# Patient Record
Sex: Female | Born: 1970
Health system: Southern US, Community
[De-identification: ages and names within clinical notes are randomized; demographics above are authoritative.]

## PROBLEM LIST (undated history)

## (undated) DIAGNOSIS — K219 Gastro-esophageal reflux disease without esophagitis: Secondary | ICD-10-CM

## (undated) DIAGNOSIS — N926 Irregular menstruation, unspecified: Secondary | ICD-10-CM

## (undated) DIAGNOSIS — G43909 Migraine, unspecified, not intractable, without status migrainosus: Secondary | ICD-10-CM

## (undated) DIAGNOSIS — Z9889 Other specified postprocedural states: Secondary | ICD-10-CM

## (undated) DIAGNOSIS — R112 Nausea with vomiting, unspecified: Secondary | ICD-10-CM

## (undated) DIAGNOSIS — F4481 Dissociative identity disorder: Secondary | ICD-10-CM

## (undated) DIAGNOSIS — F419 Anxiety disorder, unspecified: Secondary | ICD-10-CM

## (undated) DIAGNOSIS — Z87442 Personal history of urinary calculi: Secondary | ICD-10-CM

## (undated) DIAGNOSIS — M1990 Unspecified osteoarthritis, unspecified site: Secondary | ICD-10-CM

## (undated) DIAGNOSIS — N939 Abnormal uterine and vaginal bleeding, unspecified: Secondary | ICD-10-CM

## (undated) DIAGNOSIS — N6001 Solitary cyst of right breast: Secondary | ICD-10-CM

## (undated) DIAGNOSIS — T8859XA Other complications of anesthesia, initial encounter: Secondary | ICD-10-CM

## (undated) DIAGNOSIS — F329 Major depressive disorder, single episode, unspecified: Secondary | ICD-10-CM

## (undated) DIAGNOSIS — F32A Depression, unspecified: Secondary | ICD-10-CM

## (undated) HISTORY — DX: Major depressive disorder, single episode, unspecified: F32.9

## (undated) HISTORY — DX: Anxiety disorder, unspecified: F41.9

## (undated) HISTORY — PX: BREAST BIOPSY: SHX20

## (undated) HISTORY — DX: Migraine, unspecified, not intractable, without status migrainosus: G43.909

## (undated) HISTORY — DX: Depression, unspecified: F32.A

## (undated) HISTORY — PX: ABDOMINAL HYSTERECTOMY: SHX81

## (undated) HISTORY — DX: Dissociative identity disorder: F44.81

## (undated) HISTORY — DX: Unspecified osteoarthritis, unspecified site: M19.90

## (undated) HISTORY — DX: Irregular menstruation, unspecified: N92.6

## (undated) HISTORY — PX: TUBAL LIGATION: SHX77

## (undated) HISTORY — DX: Abnormal uterine and vaginal bleeding, unspecified: N93.9

---

## 2011-12-01 ENCOUNTER — Emergency Department (HOSPITAL_COMMUNITY)
Admission: EM | Admit: 2011-12-01 | Discharge: 2011-12-01 | Disposition: A | Discharge status: Home / Self Care | Payer: BC Managed Care – PPO | Attending: Emergency Medicine | Admitting: Emergency Medicine

## 2011-12-01 ENCOUNTER — Emergency Department (HOSPITAL_COMMUNITY): Admission: EM | Admit: 2011-12-01 | Payer: Medicare Other | Source: Home / Self Care

## 2011-12-01 ENCOUNTER — Encounter (HOSPITAL_COMMUNITY): Payer: Self-pay | Admitting: Emergency Medicine

## 2011-12-01 DIAGNOSIS — G43909 Migraine, unspecified, not intractable, without status migrainosus: Secondary | ICD-10-CM | POA: Insufficient documentation

## 2011-12-01 DIAGNOSIS — R51 Headache: Secondary | ICD-10-CM

## 2011-12-01 DIAGNOSIS — F17200 Nicotine dependence, unspecified, uncomplicated: Secondary | ICD-10-CM | POA: Insufficient documentation

## 2011-12-01 HISTORY — DX: Migraine, unspecified, not intractable, without status migrainosus: G43.909

## 2011-12-01 MED ORDER — SUMATRIPTAN SUCCINATE 6 MG/0.5ML ~~LOC~~ SOLN: SUBCUTANEOUS | Status: DC

## 2011-12-01 MED ORDER — SUMATRIPTAN SUCCINATE 6 MG/0.5ML ~~LOC~~ SOLN
6.0000 mg | Freq: Once | SUBCUTANEOUS | Status: AC
  Administered 2011-12-01: 6 mg via SUBCUTANEOUS
  Filled 2011-12-01: qty 0.5

## 2011-12-01 NOTE — ED Notes (Signed)
History of migraines for past 9 months. Pt states had MRI at [facility] but doenst know results. Pt states migraines are coming frequently like 2 or more a week.

## 2011-12-01 NOTE — ED Notes (Signed)
Pt reports intermittent headaches x9 months. Pt reports current headache began yesterday at 9AM. Pt denies taking anything OTC prior to arrival. Pt reports n/v. Emesis x3. Pt denies SOB,CP. NAD noted.

## 2011-12-01 NOTE — ED Provider Notes (Signed)
History     CSN: [id number]  Arrival date & time 12/01/11  1138   First MD Initiated Contact with Patient 12/01/11 1313      Chief Complaint  Patient presents with  . Migraine    (Consider location/radiation/quality/duration/timing/severity/associated sxs/prior treatment) The history is provided by the patient.  pt c/o frontal headaches, esp on right side, on and off for 9 months. Gradual onset, slowly worse. This headache is similar to prior, not her worst headache. Dull/ throbbing constant, non radiating for past day. No recent head trauma, injury, fall or syncope. No eye pain or change in vision. No neck pain or stiffness. No sinus pain/drainage or fevers. Nausea. No vomiting or diarrhea. No abd pain. No gu c/o. No numbness/weakness. No problems w balance or coordination. Drove self to ed.      History reviewed. No pertinent past medical history.  Past Surgical History  Procedure Date  . Cesarean section   . Tubal ligation     History reviewed. No pertinent family history.  History  Substance Use Topics  . Smoking status: Current Every Day Smoker -- 0.5 packs/day    Types: Cigarettes  . Smokeless tobacco: Not on file  . Alcohol Use: No    OB History    Grav Para Term Preterm Abortions TAB SAB Ect Mult Living                  Review of Systems  Constitutional: Negative for fever and chills.  HENT: Negative for neck pain and neck stiffness.   Eyes: Negative for pain, redness and visual disturbance.  Respiratory: Negative for cough and shortness of breath.   Cardiovascular: Negative for chest pain and leg swelling.  Gastrointestinal: Negative for abdominal pain.  Genitourinary: Negative for flank pain.  Musculoskeletal: Negative for back pain.  Skin: Negative for rash.  Neurological: Positive for headaches. Negative for syncope, weakness, light-headedness and numbness.  Hematological: Does not bruise/bleed easily.  Psychiatric/Behavioral: Negative for  confusion.    Allergies  Review of patient's allergies indicates no known allergies.  Home Medications  No current outpatient prescriptions on file.  BP 120/77  Pulse 71  Temp 97.5 F (36.4 C) (Oral)  Resp 18  Ht 5\' 6"  (1.676 m)  Wt 175 lb (79.379 kg)  BMI 28.25 kg/m2  SpO2 99%  LMP 12/01/2011  Physical Exam  Nursing note and vitals reviewed. Constitutional: She is oriented to person, place, and time. She appears well-developed and well-nourished. No distress.  HENT:  Head: Atraumatic.  Nose: Nose normal.  Mouth/Throat: Oropharynx is clear and moist.       No sinus or temporal tenderness.  Eyes: Conjunctivae normal and EOM are normal. Pupils are equal, round, and reactive to light. No scleral icterus.  Neck: Neck supple. No tracheal deviation present. No thyromegaly present.       No stiffness or rigidity.   Cardiovascular: Normal rate, regular rhythm, normal heart sounds and intact distal pulses.  Exam reveals no gallop and no friction rub.   No murmur heard. Pulmonary/Chest: Effort normal and breath sounds normal. No respiratory distress.  Abdominal: Soft. Normal appearance and bowel sounds are normal. She exhibits no distension. There is no tenderness.  Genitourinary:       No cva tenderness.  Musculoskeletal: Normal range of motion. She exhibits no edema and no tenderness.  Neurological: She is alert and oriented to person, place, and time. No cranial nerve deficit.       Motor intact bilaterally. Steady gait.  Skin: Skin is warm and dry. No rash noted. She is not diaphoretic.  Psychiatric: She has a normal mood and affect.    ED Course  Procedures (including critical care time)    MDM  Pt drove self. No meds pta. imitrex sq.   Pt states had ct for same at [facility], denies any acute process noted.   Staff indicates unable to get records faxed from [facility].  On recheck, pt notes resolution headache. Pt appears stable for d/c.        Suzi Roots,  MD 12/01/11 [phone]

## 2011-12-01 NOTE — ED Notes (Signed)
Pt reports that she has had headaches off and on x9 months. Pt reports current headache started at 9am. Pt hasn't taken anything OTC prior to arrival. Pt reports n/v with emesis x3. Pt denies SOB,CP. NAD noted.

## 2013-11-19 ENCOUNTER — Encounter: Payer: Self-pay | Admitting: Obstetrics and Gynecology

## 2013-11-19 ENCOUNTER — Ambulatory Visit (INDEPENDENT_AMBULATORY_CARE_PROVIDER_SITE_OTHER): Payer: BC Managed Care – PPO | Admitting: Obstetrics and Gynecology

## 2013-11-19 VITALS — BP 130/80 | Ht 67.0 in | Wt 239.0 lb

## 2013-11-19 DIAGNOSIS — N921 Excessive and frequent menstruation with irregular cycle: Secondary | ICD-10-CM

## 2013-11-19 DIAGNOSIS — N920 Excessive and frequent menstruation with regular cycle: Secondary | ICD-10-CM

## 2013-11-19 HISTORY — DX: Excessive and frequent menstruation with irregular cycle: N92.1

## 2013-11-19 MED ORDER — MEDROXYPROGESTERONE ACETATE 10 MG PO TABS: 10.0000 mg | ORAL_TABLET | Freq: Every day | ORAL | Status: DC

## 2013-11-19 NOTE — Progress Notes (Signed)
[facility] Visit  Patient name: Christine [name] MRN [id number]  Date of birth: 1970-04-14  CC & HPI:  Christine [name] is a 43 y.o. female presenting today for irregular bleeding. She states that she has never had a pelvic US. She states that she no longer wants her periods. She states that she has multiple periods in a month, and they are very heavy. She states that she has had irregular heavy periods for a couple months. She states that she was seen in the ED for the bleeding and anemia, was given Provera and sent home. Pt states that with this last period she had golf ball sized clots, and had to change her pad four times in 2 hours. Pt states that the bleeding has stopped.   She states that she is having associated symptoms of abdominal pain, lower back pain, and thigh pain. She states that from [facility] she was bleeding too bad and she was informed that she had a UTI but was not given a medication for it. Pt states that she gets migraines before starting a period.  Pt denies any pain or discomfort at this time. She states that she has had her tubes tied. She states that she smokes 1 PPD. She states that it has been awhile since she has had a pap smear. She states that her last period was 11/05/13.   ROS:  +Irregular bleeding No other complaints  Pertinent History Reviewed:   Reviewed: Significant for  Medical         Past Medical History  Diagnosis Date  . Migraines   . Depression   . Anxiety   . Irregular uterine bleeding                               Surgical Hx:    Past Surgical History  Procedure Laterality Date  . Tubal ligation    . Cesarean section  1994 , 1997    x 2    Medications: Reviewed & Updated - see associated section                      Current outpatient prescriptions:escitalopram (LEXAPRO) 10 MG tablet, , Disp: , Rfl: ;  medroxyPROGESTERone (PROVERA) 5 MG tablet, , Disp: , Rfl: ;  SUMAtriptan (IMITREX) 50 MG tablet, , Disp: ,  Rfl:    Social History: Reviewed -  reports that she has been smoking Cigarettes.  She has a 30 pack-year smoking history. She has never used smokeless tobacco.  Objective Findings:  Vitals: Blood pressure 130/80, height 5\' 7"  (1.702 m), weight 239 lb (108.41 kg), last menstrual period 11/03/2013.  Physical Examination:  Pelvic - normal external genitalia, vulva, vagina, cervix, uterus and adnexa,  VULVA: normal appearing vulva with no masses, tenderness or lesions,  VAGINA: normal appearing vagina with normal color and discharge, no lesions, CERVIX: normal appearing cervix without discharge or lesions,  UTERUS: uterus is normal size, shape, consistency and nontender, good support.      Assessment & Plan:   A:  1. Irregular bleeding /menorhagia  P:  1. Rx for 14 days of Provera for recurrent irregular bleeding.  2. Pelvic US immediately after the next period. With followup visit 3 will need pap      This chart was scribed for [name], MD by Steva Colder, ED Scribe. The patient was seen in room 1 at  11:15 AM.

## 2013-11-19 NOTE — Patient Instructions (Signed)
Endometrial Ablation Endometrial ablation removes the lining of the uterus (endometrium). It is usually a same-day, outpatient treatment. Ablation helps avoid major surgery, such as surgery to remove the cervix and uterus (hysterectomy). After endometrial ablation, you will have little or no menstrual bleeding and may not be able to have children. However, if you are premenopausal, you will need to use a reliable method of birth control following the procedure because of the small chance that pregnancy can occur. There are different reasons to have this procedure, which include:  Heavy periods.  Bleeding that is causing anemia.  Irregular bleeding.  Bleeding fibroids on the lining inside the uterus if they are smaller than 3 centimeters. This procedure should not be done if:  You want children in the future.  You have severe cramps with your menstrual period.  You have precancerous or cancerous cells in your uterus.  You were recently pregnant.  You have gone through menopause.  You have had major surgery on the uterus, such as a cesarean delivery. LET YOUR HEALTH CARE PROVIDER KNOW ABOUT:  Any allergies you have.  All medicines you are taking, including vitamins, herbs, eye drops, creams, and over-the-counter medicines.  Previous problems you or members of your family have had with the use of anesthetics.  Any blood disorders you have.  Previous surgeries you have had.  Medical conditions you have. RISKS AND COMPLICATIONS  Generally, this is a safe procedure. However, as with any procedure, complications can occur. Possible complications include:  Perforation of the uterus.  Bleeding.  Infection of the uterus, bladder, or vagina.  Injury to surrounding organs.  An air bubble to the lung (air embolus).  Pregnancy following the procedure.  Failure of the procedure to help the problem, requiring hysterectomy.  Decreased ability to diagnose cancer in the lining of  the uterus. BEFORE THE PROCEDURE  The lining of the uterus must be tested to make sure there is no pre-cancerous or cancer cells present.  An ultrasound may be performed to look at the size of the uterus and to check for abnormalities.  Medicines may be given to thin the lining of the uterus. PROCEDURE  During the procedure, your health care provider will use a tool called a resectoscope to help see inside your uterus. There are different ways to remove the lining of your uterus.   Radiofrequency - This method uses a radiofrequency-alternating electric current to remove the lining of the uterus.  Cryotherapy - This method uses extreme cold to freeze the lining of the uterus.  Heated-Free Liquid - This method uses heated salt (saline) solution to remove the lining of the uterus.  Microwave - This method uses high-energy microwaves to heat up the lining of the uterus to remove it.  Thermal balloon - This method involves inserting a catheter with a balloon tip into the uterus. The balloon tip is filled with heated fluid to remove the lining of the uterus. AFTER THE PROCEDURE  After your procedure, do not have sexual intercourse or insert anything into your vagina until permitted by your health care provider. After the procedure, you may experience:  Cramps.  Vaginal discharge.  Frequent urination. Document Released: 12/24/2003 Document Revised: 10/16/2012 Document Reviewed: 07/17/2012 [employer] Patient Information 2015 [employer]. This information is not intended to replace advice given to you by your health care provider. Make sure you discuss any questions you have with your health care provider.  

## 2013-11-19 NOTE — Progress Notes (Signed)
Patient ID: [name], female   DOB: Jul 27, 1970, 43 y.o.   MRN: [id number] Pt here today for irregular bleeding. Pt states that she has multiple periods in a month, and they are very heavy. Pt was seen in the ED for the bleeding and anemia, was given medication (Provera) and sent home. Pt states that with this last period she had golf ball sized clots, and had to change her pad four times in 2 hours. Pt states that the bleeding has stopped. Pt states that she has pain in her thighs, abdomen and lower back. Pt states that she gets migraines before starting a period. Pt states that her periods have been irregular for the last couple months, she states that her periods have always been heavy but this last period was the worse. Pt denies any pain or discomfort at this time.

## 2013-12-09 ENCOUNTER — Other Ambulatory Visit: Payer: Self-pay | Admitting: Obstetrics and Gynecology

## 2013-12-09 DIAGNOSIS — N921 Excessive and frequent menstruation with irregular cycle: Secondary | ICD-10-CM

## 2013-12-10 ENCOUNTER — Ambulatory Visit (INDEPENDENT_AMBULATORY_CARE_PROVIDER_SITE_OTHER): Payer: BC Managed Care – PPO

## 2013-12-10 ENCOUNTER — Ambulatory Visit (INDEPENDENT_AMBULATORY_CARE_PROVIDER_SITE_OTHER): Payer: BC Managed Care – PPO | Admitting: Obstetrics and Gynecology

## 2013-12-10 ENCOUNTER — Encounter: Payer: Self-pay | Admitting: Obstetrics and Gynecology

## 2013-12-10 VITALS — BP 120/80 | Ht 66.0 in

## 2013-12-10 DIAGNOSIS — N921 Excessive and frequent menstruation with irregular cycle: Secondary | ICD-10-CM

## 2013-12-10 DIAGNOSIS — N920 Excessive and frequent menstruation with regular cycle: Secondary | ICD-10-CM | POA: Insufficient documentation

## 2013-12-10 MED ORDER — MEDROXYPROGESTERONE ACETATE 5 MG PO TABS: 5.0000 mg | ORAL_TABLET | Freq: Every day | ORAL | Status: DC

## 2013-12-10 NOTE — Progress Notes (Signed)
Patient ID: Christine [name], female   DOB: [birth year], 43 y.o.   MRN: [id number] Patient ID: Christine [name], female   DOB: 01-28-1971, 43 y.o.   MRN: [id number]   [facility] Visit  Patient name: Christine [name] MRN [id number]  Date of birth: [birth year]  CC & HPI:  Christine [name] is a 43 y.o. female presenting today for [place] results. She states that she had her [place] because of heavy bleeding. She states that she can function with the pain that she gets normally. She states that she bleeds heavy for 4-5 days during her period. She states that she had a hx of migraines and she voices concerns for if there is a correlation. She states that she smokes. She denies any other associated symptoms. ROS:  +Menorrhagia No other complaints.  Pertinent History Reviewed:   Reviewed: Significant for  Medical         Past Medical History  Diagnosis Date  . Migraines   . Depression   . Anxiety   . Irregular uterine bleeding                               Surgical Hx:    Past Surgical History  Procedure Laterality Date  . Tubal ligation    . Cesarean section  1994 , 1997    x 2    Medications: Reviewed & Updated - see associated section                      Current outpatient prescriptions:escitalopram (LEXAPRO) 10 MG tablet, , Disp: , Rfl: ;  SUMAtriptan (IMITREX) 50 MG tablet, , Disp: , Rfl: ;  medroxyPROGESTERone (PROVERA) 10 MG tablet, Take 1 tablet (10 mg total) by mouth daily., Disp: 14 tablet, Rfl: 3   Social History: Reviewed -  reports that she has been smoking Cigarettes.  She has a 30 pack-year smoking history. She has never used smokeless tobacco.  Objective Findings:  Vitals: Blood pressure 120/80, height 5\' 6"  (1.676 m), last menstrual period 12/08/2013.  Physical Examination: not indicated see [place] results below  [place] results 12/10/13 Christine [name] is a 43 y.o. LMP 12/08/2013 for a pelvic sonogram for menorrhagia.  Uterus 9.4 x 6.9 x 5.2 cm, anteverted no obvious myometrial  masses noted although "streaky" posterior shadowing noted (?adenomyosis)  Endometrium 5.1 mm, symmetrical,  Right ovary 2.7 x 2.2 x 2.0 cm,  Left ovary 2.9 x 2.8 x 2.3 cm,  No free fluid or adnexal masses noted within the pelvis  Technician Comments:  Anteverted uterus noted with posterior "streaky" shadowing, Endometrium=5.44mm, bilateral adnexa/ovaries appear WNL, no free fluid or adnexal masses noted    Assessment & Plan:   A:  1. Menorrhagia with irregular cycle   P:  1. Counseling about endometrial ablation vs IUD with pt 2. F/U for Endometrium biopsy/Pre-op visit in office in 2 weeks. 3 begin provera 5 mg daily supression of endometrium til surgery 4 preop at time of endometrial bx This chart was scribed for [name], MD by Steva Colder, ED Scribe. The patient was seen in office at 10:43 AM.

## 2013-12-10 NOTE — Progress Notes (Signed)
Patient ID: Christine [name], female   DOB: 1970/07/10, 43 y.o.   MRN: [id number] Pt here today for follow up visit and [place]. Pt denies any problems or concerns at this time. Pt states that symptoms are the same as before no better.

## 2013-12-10 NOTE — Progress Notes (Deleted)
Patient ID: [name], female   DOB: Apr 20, 1970, 43 y.o.   MRN: [id number]   [facility] Visit  Patient name: Christine [name] MRN [id number]  Date of birth: 08/19/1970  CC & HPI:  Christine [name] is a 43 y.o. female presenting today for [place] results. She states that she had her [place] because of heavy bleeding. She states that she can function with the pain that she gets normally. She states that she bleeds heavy for 4-5 days during her period. She states that she had a hx of migraines and she voices concerns for if there is a correlation. She states that she smokes. She denies any other associated symptoms. ROS:  +Menorrhagia No other complaints.  Pertinent History Reviewed:   Reviewed: Significant for  Medical         Past Medical History  Diagnosis Date   Migraines    Depression    Anxiety    Irregular uterine bleeding                               Surgical Hx:    Past Surgical History  Procedure Laterality Date   Tubal ligation     Cesarean section  1994 , 1997    x 2    Medications: Reviewed & Updated - see associated section                      Current outpatient prescriptions:escitalopram (LEXAPRO) 10 MG tablet, , Disp: , Rfl: ;  SUMAtriptan (IMITREX) 50 MG tablet, , Disp: , Rfl: ;  medroxyPROGESTERone (PROVERA) 10 MG tablet, Take 1 tablet (10 mg total) by mouth daily., Disp: 14 tablet, Rfl: 3   Social History: Reviewed -  reports that she has been smoking Cigarettes.  She has a 30 pack-year smoking history. She has never used smokeless tobacco.  Objective Findings:  Vitals: Blood pressure 120/80, height 5\' 6"  (1.676 m), last menstrual period 12/08/2013.  Physical Examination: not indicated see [place] results below  [place] results 12/10/13 Christine [name] is a 43 y.o. LMP 12/08/2013 for a pelvic sonogram for menorrhagia.  Uterus 9.4 x 6.9 x 5.2 cm, anteverted no obvious myometrial masses noted although "streaky" posterior shadowing noted (?adenomyosis)   Endometrium 5.1 mm, symmetrical,  Right ovary 2.7 x 2.2 x 2.0 cm,  Left ovary 2.9 x 2.8 x 2.3 cm,  No free fluid or adnexal masses noted within the pelvis  Technician Comments:  Anteverted uterus noted with posterior "streaky" shadowing, Endometrium=5.38mm, bilateral adnexa/ovaries appear WNL, no free fluid or adnexal masses noted    Assessment & Plan:   A:  1. Menorrhagia with irregular cycle   P:  1. Counseling about endometrial ablation vs IUD with pt 2. F/U for Endometrium biopsy/Pre-op visit in office in 2 weeks. 3 begin provera 5 mg daily supression of endometrium til surgery 4 preop at time of endometrial bx This chart was scribed for [name], MD by Steva Colder, ED Scribe. The patient was seen in office at 10:43 AM.

## 2013-12-24 ENCOUNTER — Other Ambulatory Visit: Payer: Self-pay | Admitting: Obstetrics and Gynecology

## 2013-12-24 ENCOUNTER — Ambulatory Visit (INDEPENDENT_AMBULATORY_CARE_PROVIDER_SITE_OTHER): Payer: BC Managed Care – PPO | Admitting: Obstetrics and Gynecology

## 2013-12-24 ENCOUNTER — Encounter: Payer: Self-pay | Admitting: Obstetrics and Gynecology

## 2013-12-24 VITALS — BP 110/70 | Ht 66.0 in | Wt 242.0 lb

## 2013-12-24 DIAGNOSIS — N921 Excessive and frequent menstruation with irregular cycle: Secondary | ICD-10-CM

## 2013-12-24 DIAGNOSIS — Z3200 Encounter for pregnancy test, result unknown: Secondary | ICD-10-CM

## 2013-12-24 DIAGNOSIS — Z3202 Encounter for pregnancy test, result negative: Secondary | ICD-10-CM

## 2013-12-24 LAB — POCT URINE PREGNANCY: Preg Test, Ur: NEGATIVE

## 2013-12-24 NOTE — Progress Notes (Signed)
Christine [name] is a 43 y.o. female who presents today for endometrial biopsy. Discussed IUD option with pt and she declined them. Methods of reducing heavy periods discussed with patient as well. She denies any other symptoms. She states that her last period was Patient's last menstrual period was 12/08/2013. She states that her last period was not normal because of the 5 mg Provera that she is on. She states that she is sexually active and her tubes are tied. She denies being pregnant.   Endometrial Biopsy: Patient given informed consent, signed copy in the chart, time out was performed. Time out taken. The patient was placed in the lithotomy position and the cervix brought into view with sterile speculum.  Portio of cervix cleansed x 2 with betadine swabs.  A tenaculum was placed in the anterior lip of the cervix. The uterus was sounded for depth of 10 cm,. Milex uterine Explora 3 mm was introduced to into the uterus, suction created,  and an endometrial sample was obtained. All equipment was removed and accounted for.   The patient tolerated the procedure well.   Patient given post procedure instructions.  Followup: will review pathology and present to insurer for consideration of endometrial ablation A:  1. menorrhagia with irregular cycles, declining consideration of IUD 2  P: 1. F/U for post op visit Will hold surgery till reviewed with insurer. This chart was scribed for [name], MD by Steva Colder, ED Scribe. The patient was seen in room 2 at 3:33 PM.

## 2013-12-24 NOTE — Patient Instructions (Signed)
We will call you with an update once peer to peer review completed with biopsy results  Endometrial Ablation Endometrial ablation removes the lining of the uterus (endometrium). It is usually a same-day, outpatient treatment. Ablation helps avoid major surgery, such as surgery to remove the cervix and uterus (hysterectomy). After endometrial ablation, you will have little or no menstrual bleeding and may not be able to have children. However, if you are premenopausal, you will need to use a reliable method of birth control following the procedure because of the small chance that pregnancy can occur. There are different reasons to have this procedure, which include:  Heavy periods.  Bleeding that is causing anemia.  Irregular bleeding.  Bleeding fibroids on the lining inside the uterus if they are smaller than 3 centimeters. This procedure should not be done if:  You want children in the future.  You have severe cramps with your menstrual period.  You have precancerous or cancerous cells in your uterus.  You were recently pregnant.  You have gone through menopause.  You have had major surgery on the uterus, such as a cesarean delivery. LET [facility] CARE PROVIDER KNOW ABOUT:  Any allergies you have.  All medicines you are taking, including vitamins, herbs, eye drops, creams, and over-the-counter medicines.  Previous problems you or members of your family have had with the use of anesthetics.  Any blood disorders you have.  Previous surgeries you have had.  Medical conditions you have. RISKS AND COMPLICATIONS  Generally, this is a safe procedure. However, as with any procedure, complications can occur. Possible complications include:  Perforation of the uterus.  Bleeding.  Infection of the uterus, bladder, or vagina.  Injury to surrounding organs.  An air bubble to the lung (air embolus).  Pregnancy following the procedure.  Failure of the procedure to help the  problem, requiring hysterectomy.  Decreased ability to diagnose cancer in the lining of the uterus. BEFORE THE PROCEDURE  The lining of the uterus must be tested to make sure there is no pre-cancerous or cancer cells present.  An ultrasound may be performed to look at the size of the uterus and to check for abnormalities.  Medicines may be given to thin the lining of the uterus. PROCEDURE  During the procedure, your health care provider will use a tool called a resectoscope to help see inside your uterus. There are different ways to remove the lining of your uterus.   Radiofrequency - This method uses a radiofrequency-alternating electric current to remove the lining of the uterus.  Cryotherapy - This method uses extreme cold to freeze the lining of the uterus.  Heated-Free Liquid - This method uses heated salt (saline) solution to remove the lining of the uterus.  Microwave - This method uses high-energy microwaves to heat up the lining of the uterus to remove it.  Thermal balloon - This method involves inserting a catheter with a balloon tip into the uterus. The balloon tip is filled with heated fluid to remove the lining of the uterus. AFTER THE PROCEDURE  After your procedure, do not have sexual intercourse or insert anything into your vagina until permitted by your health care provider. After the procedure, you may experience:  Cramps.  Vaginal discharge.  Frequent urination. Document Released: 12/24/2003 Document Revised: 10/16/2012 Document Reviewed: 07/17/2012 [facility] Patient Information 2015 [place], [place]. This information is not intended to replace advice given to you by your health care provider. Make sure you discuss any questions you have with  your health care provider.

## 2013-12-24 NOTE — Progress Notes (Signed)
Patient ID: Christine [name], female   DOB: 12-Oct-1970, 43 y.o.   MRN: [id number] Pt here today for her pre op visit. Pt's insurance requires a peer to peer review before approving her surgery.

## 2013-12-30 ENCOUNTER — Ambulatory Visit (INDEPENDENT_AMBULATORY_CARE_PROVIDER_SITE_OTHER): Payer: BC Managed Care – PPO | Admitting: Obstetrics & Gynecology

## 2013-12-30 ENCOUNTER — Encounter: Payer: Self-pay | Admitting: Obstetrics & Gynecology

## 2013-12-30 VITALS — BP 120/70 | Wt 243.0 lb

## 2013-12-30 DIAGNOSIS — B9689 Other specified bacterial agents as the cause of diseases classified elsewhere: Secondary | ICD-10-CM

## 2013-12-30 DIAGNOSIS — N760 Acute vaginitis: Secondary | ICD-10-CM

## 2013-12-30 DIAGNOSIS — A499 Bacterial infection, unspecified: Secondary | ICD-10-CM

## 2013-12-30 DIAGNOSIS — R319 Hematuria, unspecified: Secondary | ICD-10-CM

## 2013-12-30 LAB — POCT URINALYSIS DIPSTICK
GLUCOSE UA: NEGATIVE
Ketones, UA: NEGATIVE
Leukocytes, UA: NEGATIVE
Nitrite, UA: NEGATIVE
Protein, UA: NEGATIVE
RBC UA: 2

## 2013-12-30 MED ORDER — CLINDAMYCIN PHOSPHATE 100 MG VA SUPP: 100.0000 mg | Freq: Every day | VAGINAL | Status: DC

## 2013-12-30 NOTE — Progress Notes (Signed)
Patient ID: Christine [name], female   DOB: 08-23-1970, 43 y.o.   MRN: [id number] Pt with history of recurrent BV, has symptoms today Urine is clear + odor  Blood pressure 120/70, weight 243 lb (110.224 kg), last menstrual period 12/08/2013. [facility] Some blood in the vault Wet prep lots of RBC some clue cells no trichomonas no yeast   Cleocin vaginal x 3(pt states metrogel does not absorb) Probiotics recommended and fiber for poor GI habits  biospy report given to pt as normal Continue with ablation plans

## 2013-12-31 LAB — URINE CULTURE
Colony Count: NO GROWTH
ORGANISM ID, BACTERIA: NO GROWTH

## 2014-01-07 NOTE — Patient Instructions (Signed)
Christine [name]  01/07/2014   Your procedure is scheduled on:   01/13/2014  Report to [facility] at  1045  AM.  Call this number if you have problems the morning of surgery: [phone]   Remember:   Do not eat food or drink liquids after midnight.   Take these medicines the morning of surgery with A SIP OF WATER: lexapro, imitrex if needed.   Do not wear jewelry, make-up or nail polish.  Do not wear lotions, powders, or perfumes.   Do not shave 48 hours prior to surgery. Men may shave face and neck.  Do not bring valuables to the hospital.  [facility] is not responsible for any belongings or valuables.               Contacts, dentures or bridgework may not be worn into surgery.  Leave suitcase in the car. After surgery it may be brought to your room.  For patients admitted to the hospital, discharge time is determined by your treatment team.               Patients discharged the day of surgery will not be allowed to drive home.  Name and phone number of your driver: family  Special Instructions: Shower using CHG 2 nights before surgery and the night before surgery.  If you shower the day of surgery use CHG.  Use special wash - you have one bottle of CHG for all showers.  You should use approximately 1/3 of the bottle for each shower.   Please read over the following fact sheets that you were given: Pain Booklet, Coughing and Deep Breathing, Surgical Site Infection Prevention, Anesthesia Post-op Instructions and Care and Recovery After Surgery Dilation and Curettage or Vacuum Curettage Dilation and curettage (D&C) and vacuum curettage are minor procedures. A D&C involves stretching (dilation) the cervix and scraping (curettage) the inside lining of the womb (uterus). During a D&C, tissue is gently scraped from the inside lining of the uterus. During a vacuum curettage, the lining and tissue in the uterus are removed with the use of gentle suction.  Curettage may be  performed to either diagnose or treat a problem. As a diagnostic procedure, curettage is performed to examine tissues from the uterus. A diagnostic curettage may be performed for the following symptoms:   Irregular bleeding in the uterus.   Bleeding with the development of clots.   Spotting between menstrual periods.   Prolonged menstrual periods.   Bleeding after menopause.   No menstrual period (amenorrhea).   A change in size and shape of the uterus.  As a treatment procedure, curettage may be performed for the following reasons:   Removal of an IUD (intrauterine device).   Removal of retained placenta after giving birth. Retained placenta can cause an infection or bleeding severe enough to require transfusions.   Abortion.   Miscarriage.   Removal of polyps inside the uterus.   Removal of uncommon types of noncancerous lumps (fibroids).  LET [facility] CARE PROVIDER KNOW ABOUT:   Any allergies you have.   All medicines you are taking, including vitamins, herbs, eye drops, creams, and over-the-counter medicines.   Previous problems you or members of your family have had with the use of anesthetics.   Any blood disorders you have.   Previous surgeries you have had.   Medical conditions you have. RISKS AND COMPLICATIONS  Generally, this is a safe procedure. However, as  with any procedure, complications can occur. Possible complications include:  Excessive bleeding.   Infection of the uterus.   Damage to the cervix.   Development of scar tissue (adhesions) inside the uterus, later causing abnormal amounts of menstrual bleeding.   Complications from the general anesthetic, if a general anesthetic is used.   Putting a hole (perforation) in the uterus. This is rare.  BEFORE THE PROCEDURE   Eat and drink before the procedure only as directed by your health care provider.   Arrange for someone to take you home.  PROCEDURE  This  procedure usually takes about 15-30 minutes.  You will be given one of the following:  A medicine that numbs the area in and around the cervix (local anesthetic).   A medicine to make you sleep through the procedure (general anesthetic).  You will lie on your back with your legs in stirrups.   A warm metal or plastic instrument (speculum) will be placed in your vagina to keep it open and to allow the health care provider to see the cervix.  There are two ways in which your cervix can be softened and dilated. These include:   Taking a medicine.   Having thin rods (laminaria) inserted into your cervix.   A curved tool (curette) will be used to scrape cells from the inside lining of the uterus. In some cases, gentle suction is applied with the curette. The curette will then be removed.  AFTER THE PROCEDURE   You will rest in the recovery area until you are stable and are ready to go home.   You may feel sick to your stomach (nauseous) or throw up (vomit) if you were given a general anesthetic.   You may have a sore throat if a tube was placed in your throat during general anesthesia.   You may have light cramping and bleeding. This may last for 2 days to 2 weeks after the procedure.   Your uterus needs to make a new lining after the procedure. This may make your next period late. Document Released: 02/13/2005 Document Revised: 10/16/2012 Document Reviewed: 09/12/2012 [facility] Patient Information 2015 [place], [place]. This information is not intended to replace advice given to you by your health care provider. Make sure you discuss any questions you have with your health care provider. Hysteroscopy Hysteroscopy is a procedure used for looking inside the womb (uterus). It may be done for various reasons, including:  To evaluate abnormal bleeding, fibroid (benign, noncancerous) tumors, polyps, scar tissue (adhesions), and possibly cancer of the uterus.  To look for lumps  (tumors) and other uterine growths.  To look for causes of why a woman cannot get pregnant (infertility), causes of recurrent loss of pregnancy (miscarriages), or a lost intrauterine device (IUD).  To perform a sterilization by blocking the fallopian tubes from inside the uterus. In this procedure, a thin, flexible tube with a tiny light and camera on the end of it (hysteroscope) is used to look inside the uterus. A hysteroscopy should be done right after a menstrual period to be sure you are not pregnant. LET [facility] CARE PROVIDER KNOW ABOUT:   Any allergies you have.  All medicines you are taking, including vitamins, herbs, eye drops, creams, and over-the-counter medicines.  Previous problems you or members of your family have had with the use of anesthetics.  Any blood disorders you have.  Previous surgeries you have had.  Medical conditions you have. RISKS AND COMPLICATIONS  Generally, this is a  safe procedure. However, as with any procedure, complications can occur. Possible complications include:  Putting a hole in the uterus.  Excessive bleeding.  Infection.  Damage to the cervix.  Injury to other organs.  Allergic reaction to medicines.  Too much fluid used in the uterus for the procedure. BEFORE THE PROCEDURE   Ask your health care provider about changing or stopping any regular medicines.  Do not take aspirin or blood thinners for 1 week before the procedure, or as directed by your health care provider. These can cause bleeding.  If you smoke, do not smoke for 2 weeks before the procedure.  In some cases, a medicine is placed in the cervix the day before the procedure. This medicine makes the cervix have a larger opening (dilate). This makes it easier for the instrument to be inserted into the uterus during the procedure.  Do not eat or drink anything for at least 8 hours before the surgery.  Arrange for someone to take you home after the  procedure. PROCEDURE   You may be given a medicine to relax you (sedative). You may also be given one of the following:  A medicine that numbs the area around the cervix (local anesthetic).  A medicine that makes you sleep through the procedure (general anesthetic).  The hysteroscope is inserted through the vagina into the uterus. The camera on the hysteroscope sends a picture to a TV screen. This gives the surgeon a good view inside the uterus.  During the procedure, air or a liquid is put into the uterus, which allows the surgeon to see better.  Sometimes, tissue is gently scraped from inside the uterus. These tissue samples are sent to a lab for testing. AFTER THE PROCEDURE   If you had a general anesthetic, you may be groggy for a couple hours after the procedure.  If you had a local anesthetic, you will be able to go home as soon as you are stable and feel ready.  You may have some cramping. This normally lasts for a couple days.  You may have bleeding, which varies from light spotting for a few days to menstrual-like bleeding for 3-7 days. This is normal.  If your test results are not back during the visit, make an appointment with your health care provider to find out the results. Document Released: 05/22/2000 Document Revised: 12/04/2012 Document Reviewed: 09/12/2012 [facility] Patient Information 2015 [place], [place]. This information is not intended to replace advice given to you by your health care provider. Make sure you discuss any questions you have with your health care provider. Endometrial Ablation Endometrial ablation removes the lining of the uterus (endometrium). It is usually a same-day, outpatient treatment. Ablation helps avoid major surgery, such as surgery to remove the cervix and uterus (hysterectomy). After endometrial ablation, you will have little or no menstrual bleeding and may not be able to have children. However, if you are premenopausal, you will need to  use a reliable method of birth control following the procedure because of the small chance that pregnancy can occur. There are different reasons to have this procedure, which include:  Heavy periods.  Bleeding that is causing anemia.  Irregular bleeding.  Bleeding fibroids on the lining inside the uterus if they are smaller than 3 centimeters. This procedure should not be done if:  You want children in the future.  You have severe cramps with your menstrual period.  You have precancerous or cancerous cells in your uterus.  You were recently  pregnant.  You have gone through menopause.  You have had major surgery on the uterus, such as a cesarean delivery. LET [facility] CARE PROVIDER KNOW ABOUT:  Any allergies you have.  All medicines you are taking, including vitamins, herbs, eye drops, creams, and over-the-counter medicines.  Previous problems you or members of your family have had with the use of anesthetics.  Any blood disorders you have.  Previous surgeries you have had.  Medical conditions you have. RISKS AND COMPLICATIONS  Generally, this is a safe procedure. However, as with any procedure, complications can occur. Possible complications include:  Perforation of the uterus.  Bleeding.  Infection of the uterus, bladder, or vagina.  Injury to surrounding organs.  An air bubble to the lung (air embolus).  Pregnancy following the procedure.  Failure of the procedure to help the problem, requiring hysterectomy.  Decreased ability to diagnose cancer in the lining of the uterus. BEFORE THE PROCEDURE  The lining of the uterus must be tested to make sure there is no pre-cancerous or cancer cells present.  An ultrasound may be performed to look at the size of the uterus and to check for abnormalities.  Medicines may be given to thin the lining of the uterus. PROCEDURE  During the procedure, your health care provider will use a tool called a resectoscope to  help see inside your uterus. There are different ways to remove the lining of your uterus.   Radiofrequency - This method uses a radiofrequency-alternating electric current to remove the lining of the uterus.  Cryotherapy - This method uses extreme cold to freeze the lining of the uterus.  Heated-Free Liquid - This method uses heated salt (saline) solution to remove the lining of the uterus.  Microwave - This method uses high-energy microwaves to heat up the lining of the uterus to remove it.  Thermal balloon - This method involves inserting a catheter with a balloon tip into the uterus. The balloon tip is filled with heated fluid to remove the lining of the uterus. AFTER THE PROCEDURE  After your procedure, do not have sexual intercourse or insert anything into your vagina until permitted by your health care provider. After the procedure, you may experience:  Cramps.  Vaginal discharge.  Frequent urination. Document Released: 12/24/2003 Document Revised: 10/16/2012 Document Reviewed: 07/17/2012 [facility] Patient Information 2015 [place], [place]. This information is not intended to replace advice given to you by your health care provider. Make sure you discuss any questions you have with your health care provider. PATIENT INSTRUCTIONS POST-ANESTHESIA  IMMEDIATELY FOLLOWING SURGERY:  Do not drive or operate machinery for the first twenty four hours after surgery.  Do not make any important decisions for twenty four hours after surgery or while taking narcotic pain medications or sedatives.  If you develop intractable nausea and vomiting or a severe headache please notify your doctor immediately.  FOLLOW-UP:  Please make an appointment with your surgeon as instructed. You do not need to follow up with anesthesia unless specifically instructed to do so.  WOUND CARE INSTRUCTIONS (if applicable):  Keep a dry clean dressing on the anesthesia/puncture wound site if there is drainage.  Once the  wound has quit draining you may leave it open to air.  Generally you should leave the bandage intact for twenty four hours unless there is drainage.  If the epidural site drains for more than 36-48 hours please call the anesthesia department.  QUESTIONS?:  Please feel free to call your physician or the hospital  operator if you have any questions, and they will be happy to assist you.

## 2014-01-08 ENCOUNTER — Encounter (HOSPITAL_COMMUNITY): Payer: Self-pay

## 2014-01-08 ENCOUNTER — Encounter (HOSPITAL_COMMUNITY)
Admission: RE | Admit: 2014-01-08 | Discharge: 2014-01-08 | Disposition: A | Discharge status: Home / Self Care | Payer: BC Managed Care – PPO | Source: Ambulatory Visit | Attending: Obstetrics and Gynecology | Admitting: Obstetrics and Gynecology

## 2014-01-08 DIAGNOSIS — Z01812 Encounter for preprocedural laboratory examination: Secondary | ICD-10-CM | POA: Diagnosis present

## 2014-01-08 LAB — URINE MICROSCOPIC-ADD ON

## 2014-01-08 LAB — URINALYSIS, ROUTINE W REFLEX MICROSCOPIC
Bilirubin Urine: NEGATIVE
GLUCOSE, UA: NEGATIVE mg/dL
Ketones, ur: NEGATIVE mg/dL
Leukocytes, UA: NEGATIVE
Nitrite: NEGATIVE
Protein, ur: NEGATIVE mg/dL
Urobilinogen, UA: 0.2 mg/dL (ref 0.0–1.0)
pH: 5.5 (ref 5.0–8.0)

## 2014-01-08 LAB — CBC WITH DIFFERENTIAL/PLATELET
Basophils Absolute: 0 10*3/uL (ref 0.0–0.1)
Basophils Relative: 0 % (ref 0–1)
Eosinophils Absolute: 0.3 10*3/uL (ref 0.0–0.7)
Eosinophils Relative: 4 % (ref 0–5)
HEMATOCRIT: 37.8 % (ref 36.0–46.0)
HEMOGLOBIN: 13.2 g/dL (ref 12.0–15.0)
LYMPHS PCT: 30 % (ref 12–46)
Lymphs Abs: 2.5 10*3/uL (ref 0.7–4.0)
MCH: 30.1 pg (ref 26.0–34.0)
MCHC: 34.9 g/dL (ref 30.0–36.0)
MCV: 86.3 fL (ref 78.0–100.0)
MONO ABS: 0.6 10*3/uL (ref 0.1–1.0)
MONOS PCT: 7 % (ref 3–12)
NEUTROS ABS: 4.9 10*3/uL (ref 1.7–7.7)
Neutrophils Relative %: 59 % (ref 43–77)
Platelets: 306 10*3/uL (ref 150–400)
RBC: 4.38 MIL/uL (ref 3.87–5.11)
RDW: 12.9 % (ref 11.5–15.5)
WBC: 8.2 10*3/uL (ref 4.0–10.5)

## 2014-01-08 LAB — HCG, SERUM, QUALITATIVE: Preg, Serum: NEGATIVE

## 2014-01-12 NOTE — H&P (Signed)
    Christine Kind, MD at 12/10/2013 11:12 AM     Status: Signed       Expand All Collapse All   Patient ID: Christine [name], female DOB: 07/20/1970, 43 y.o. MRN: [id number] Patient ID: Christine [name] Visit  Patient name: Christine [name] [id number] Date of birth: [birth year]  CC & HPI:  Christine [name] is a 43 y.o. female presenting today for [place] results. She states that she had her [place] because of heavy bleeding. She states that she can function with the pain that she gets normally. She states that she bleeds heavy for 4-5 days during her period. She states that she had a hx of migraines and she voices concerns for if there is a correlation. She states that she smokes. She denies any other associated symptoms. ROS:  +Menorrhagia No other complaints.  Pertinent History Reviewed:  Reviewed: Significant for  Medical  Past Medical History  Diagnosis Date  . Migraines   . Depression   . Anxiety   . Irregular uterine bleeding     Surgical Hx:  Past Surgical History  Procedure Laterality Date  . Tubal ligation    . Cesarean section  1994 , 1997    x 2    Medications: Reviewed & Updated - see associated section  Current outpatient prescriptions:escitalopram (LEXAPRO) 10 MG tablet, , Disp: , Rfl: ; SUMAtriptan (IMITREX) 50 MG tablet, , Disp: , Rfl: ; medroxyPROGESTERone (PROVERA) 10 MG tablet, Take 1 tablet (10 mg total) by mouth daily., Disp: 14 tablet, Rfl: 3   Social History: Reviewed - reports that she has been smoking Cigarettes. She has a 30 pack-year smoking history. She has never used smokeless tobacco.  Objective Findings:  Vitals: Blood pressure 120/80, height 5\' 6"  (1.676 m), last menstrual period 12/08/2013.  Physical Examination: Physical Examination: General appearance - alert, well appearing, and in no distress, oriented to person,  place, and time and normal appearing weight Mental status - alert, oriented to person, place, and time, normal mood, behavior, speech, dress, motor activity, and thought processes Eyes - pupils equal and reactive, extraocular eye movements intact Neck - supple, no significant adenopathy Chest - clear to auscultation, no wheezes, rales or rhonchi, symmetric air entry Abdomen - soft, nontender, nondistended, no masses or organomegaly Pelvic - normal external genitalia, vulva, vagina, cervix, uterus and adnexa, VULVA: normal appearing vulva with no masses, tenderness or lesions, VAGINA: normal appearing vagina with normal color and discharge, no lesions Extremities - peripheral pulses normal, no pedal edema, no clubbing or cyanosis, Homan's sign negative bilaterally  US results 12/10/13 Myrle Wanek is a 43 y.o. LMP 12/08/2013 for a pelvic sonogram for menorrhagia.  Uterus 9.4 x 6.9 x 5.2 cm, anteverted no obvious myometrial masses noted although "streaky" posterior shadowing noted (?adenomyosis)  Endometrium 5.1 mm, symmetrical,  Right ovary 2.7 x 2.2 x 2.0 cm,  Left ovary 2.9 x 2.8 x 2.3 cm,  No free fluid or adnexal masses noted within the pelvis  Technician Comments:  Anteverted uterus noted with posterior "streaky" shadowing, Endometrium=5.51mm, bilateral adnexa/ovaries appear WNL, no free fluid or adnexal masses noted    Assessment & Plan:   A:  1. Menorrhagia with irregular cycle 2. Normal endometrium, pt declines consideration of IUD, and OCPare contraindicated in smoker of her age.   P:  1 proceed with hysteroscopy dilation and curettage, with endometrial ablation.

## 2014-01-13 ENCOUNTER — Encounter (HOSPITAL_COMMUNITY)
Admission: RE | Disposition: A | Discharge status: Home / Self Care | Payer: Self-pay | Source: Ambulatory Visit | Attending: Obstetrics and Gynecology

## 2014-01-13 ENCOUNTER — Ambulatory Visit (HOSPITAL_COMMUNITY): Payer: BC Managed Care – PPO | Admitting: Certified Registered Nurse Anesthetist

## 2014-01-13 ENCOUNTER — Ambulatory Visit (HOSPITAL_COMMUNITY)
Admission: RE | Admit: 2014-01-13 | Discharge: 2014-01-13 | Disposition: A | Discharge status: Home / Self Care | Payer: BC Managed Care – PPO | Source: Ambulatory Visit | Attending: Obstetrics and Gynecology | Admitting: Obstetrics and Gynecology

## 2014-01-13 ENCOUNTER — Encounter (HOSPITAL_COMMUNITY): Payer: Self-pay | Admitting: Certified Registered Nurse Anesthetist

## 2014-01-13 DIAGNOSIS — N920 Excessive and frequent menstruation with regular cycle: Secondary | ICD-10-CM | POA: Insufficient documentation

## 2014-01-13 DIAGNOSIS — F418 Other specified anxiety disorders: Secondary | ICD-10-CM | POA: Diagnosis not present

## 2014-01-13 DIAGNOSIS — Z029 Encounter for administrative examinations, unspecified: Secondary | ICD-10-CM

## 2014-01-13 DIAGNOSIS — Z79899 Other long term (current) drug therapy: Secondary | ICD-10-CM | POA: Diagnosis not present

## 2014-01-13 DIAGNOSIS — F17210 Nicotine dependence, cigarettes, uncomplicated: Secondary | ICD-10-CM | POA: Diagnosis not present

## 2014-01-13 HISTORY — PX: DILITATION & CURRETTAGE/HYSTROSCOPY WITH THERMACHOICE ABLATION: SHX5569

## 2014-01-13 SURGERY — DILATATION & CURETTAGE/HYSTEROSCOPY WITH THERMACHOICE ABLATION
Anesthesia: General | Site: Vagina

## 2014-01-13 MED ORDER — ONDANSETRON HCL 4 MG/2ML IJ SOLN
INTRAMUSCULAR | Status: AC
  Filled 2014-01-13: qty 2

## 2014-01-13 MED ORDER — KETOROLAC TROMETHAMINE 30 MG/ML IJ SOLN
30.0000 mg | Freq: Once | INTRAMUSCULAR | Status: AC
  Administered 2014-01-13: 30 mg via INTRAVENOUS

## 2014-01-13 MED ORDER — LIDOCAINE HCL (CARDIAC) 20 MG/ML IV SOLN
INTRAVENOUS | Status: DC | PRN
  Administered 2014-01-13: 50 mg via INTRAVENOUS

## 2014-01-13 MED ORDER — ARTIFICIAL TEARS OP OINT
TOPICAL_OINTMENT | OPHTHALMIC | Status: AC
  Filled 2014-01-13: qty 3.5

## 2014-01-13 MED ORDER — ONDANSETRON HCL 4 MG/2ML IJ SOLN: 4.0000 mg | Freq: Once | INTRAMUSCULAR | Status: DC | PRN

## 2014-01-13 MED ORDER — FENTANYL CITRATE 0.05 MG/ML IJ SOLN
INTRAMUSCULAR | Status: DC | PRN
  Administered 2014-01-13: 50 ug via INTRAVENOUS
  Administered 2014-01-13: 25 ug via INTRAVENOUS
  Administered 2014-01-13 (×3): 50 ug via INTRAVENOUS
  Administered 2014-01-13: 25 ug via INTRAVENOUS

## 2014-01-13 MED ORDER — FENTANYL CITRATE 0.05 MG/ML IJ SOLN
INTRAMUSCULAR | Status: AC
  Filled 2014-01-13: qty 2

## 2014-01-13 MED ORDER — LACTATED RINGERS IV SOLN
INTRAVENOUS | Status: DC
  Administered 2014-01-13: 1000 mL via INTRAVENOUS

## 2014-01-13 MED ORDER — OXYCODONE-ACETAMINOPHEN 5-325 MG PO TABS: 1.0000 | ORAL_TABLET | ORAL | Status: DC | PRN

## 2014-01-13 MED ORDER — MIDAZOLAM HCL 2 MG/2ML IJ SOLN
1.0000 mg | INTRAMUSCULAR | Status: DC | PRN
  Administered 2014-01-13: 2 mg via INTRAVENOUS

## 2014-01-13 MED ORDER — SODIUM CHLORIDE 0.9 % IR SOLN
Status: DC | PRN
  Administered 2014-01-13: 1000 mL

## 2014-01-13 MED ORDER — LACTATED RINGERS IV SOLN
INTRAVENOUS | Status: DC | PRN
  Administered 2014-01-13: 11:00:00 via INTRAVENOUS

## 2014-01-13 MED ORDER — KETOROLAC TROMETHAMINE 30 MG/ML IJ SOLN
INTRAMUSCULAR | Status: AC
  Filled 2014-01-13: qty 1

## 2014-01-13 MED ORDER — BUPIVACAINE-EPINEPHRINE 0.5% -1:200000 IJ SOLN
INTRAMUSCULAR | Status: DC | PRN
  Administered 2014-01-13: 20 mL

## 2014-01-13 MED ORDER — DEXTROSE 5 % IV SOLN
INTRAVENOUS | Status: DC | PRN
  Administered 2014-01-13: 500 mL via INTRAVENOUS

## 2014-01-13 MED ORDER — ARTIFICIAL TEARS OP OINT
TOPICAL_OINTMENT | OPHTHALMIC | Status: DC | PRN
  Administered 2014-01-13: 1 via OPHTHALMIC

## 2014-01-13 MED ORDER — ONDANSETRON HCL 4 MG/2ML IJ SOLN
4.0000 mg | Freq: Once | INTRAMUSCULAR | Status: AC
  Administered 2014-01-13: 4 mg via INTRAVENOUS

## 2014-01-13 MED ORDER — MIDAZOLAM HCL 2 MG/2ML IJ SOLN
INTRAMUSCULAR | Status: AC
  Filled 2014-01-13: qty 2

## 2014-01-13 MED ORDER — PROPOFOL 10 MG/ML IV BOLUS
INTRAVENOUS | Status: DC | PRN
  Administered 2014-01-13: 150 mg via INTRAVENOUS

## 2014-01-13 MED ORDER — FENTANYL CITRATE 0.05 MG/ML IJ SOLN
25.0000 ug | INTRAMUSCULAR | Status: DC | PRN
  Administered 2014-01-13 (×3): 50 ug via INTRAVENOUS
  Filled 2014-01-13: qty 2

## 2014-01-13 MED ORDER — KETOROLAC TROMETHAMINE 10 MG PO TABS: 10.0000 mg | ORAL_TABLET | Freq: Four times a day (QID) | ORAL | Status: DC | PRN

## 2014-01-13 MED ORDER — FENTANYL CITRATE 0.05 MG/ML IJ SOLN
25.0000 ug | INTRAMUSCULAR | Status: AC
  Administered 2014-01-13: 25 ug via INTRAVENOUS
  Filled 2014-01-13: qty 2

## 2014-01-13 SURGICAL SUPPLY — 28 items
BAG DECANTER FOR FLEXI CONT (MISCELLANEOUS) ×2 IMPLANT
BAG HAMPER (MISCELLANEOUS) ×2 IMPLANT
CATH ROBINSON RED A/P 16FR (CATHETERS) ×2 IMPLANT
CATH THERMACHOICE III (CATHETERS) ×2 IMPLANT
CLOTH BEACON ORANGE TIMEOUT ST (SAFETY) ×2 IMPLANT
COVER LIGHT HANDLE STERIS (MISCELLANEOUS) ×4 IMPLANT
FORMALIN 10 PREFIL 120ML (MISCELLANEOUS) IMPLANT
GLOVE BIOGEL PI IND STRL 6.5 (GLOVE) ×1 IMPLANT
GLOVE BIOGEL PI IND STRL 9 (GLOVE) ×1 IMPLANT
GLOVE BIOGEL PI INDICATOR 6.5 (GLOVE) ×1
GLOVE BIOGEL PI INDICATOR 9 (GLOVE) ×1
GLOVE ECLIPSE 9.0 STRL (GLOVE) ×2 IMPLANT
GLOVE INDICATOR 7.0 STRL GRN (GLOVE) ×4 IMPLANT
GOWN SPEC L3 XXLG W/TWL (GOWN DISPOSABLE) ×2 IMPLANT
GOWN STRL REUS W/TWL LRG LVL3 (GOWN DISPOSABLE) ×4 IMPLANT
INST SET HYSTEROSCOPY (KITS) ×2 IMPLANT
IV D5W 500ML (IV SOLUTION) ×2 IMPLANT
IV NS 1000ML (IV SOLUTION) ×1
IV NS 1000ML BAXH (IV SOLUTION) ×1 IMPLANT
KIT ROOM TURNOVER AP CYSTO (KITS) ×2 IMPLANT
MANIFOLD NEPTUNE II (INSTRUMENTS) ×2 IMPLANT
NS IRRIG 1000ML POUR BTL (IV SOLUTION) ×2 IMPLANT
PACK PERI GYN (CUSTOM PROCEDURE TRAY) ×2 IMPLANT
PAD ARMBOARD 7.5X6 YLW CONV (MISCELLANEOUS) ×2 IMPLANT
PAD TELFA 3X4 1S STER (GAUZE/BANDAGES/DRESSINGS) ×2 IMPLANT
SET BASIN LINEN APH (SET/KITS/TRAYS/PACK) ×2 IMPLANT
SET IRRIG Y TYPE TUR BLADDER L (SET/KITS/TRAYS/PACK) ×2 IMPLANT
SYRINGE CONTROL L 12CC (SYRINGE) ×2 IMPLANT

## 2014-01-13 NOTE — Op Note (Signed)
C brief operative note for details

## 2014-01-13 NOTE — Anesthesia Preprocedure Evaluation (Signed)
Anesthesia Evaluation  Patient identified by MRN, date of birth, ID band Patient awake    Reviewed: Allergy & Precautions, H&P , NPO status , Patient's Chart, lab work & pertinent test results  Airway Mallampati: I  TM Distance: >3 FB     Dental  (+) Teeth Intact   Pulmonary Current Smoker,  breath sounds clear to auscultation        Cardiovascular negative cardio ROS  Rhythm:Regular Rate:Normal     Neuro/Psych  Headaches, PSYCHIATRIC DISORDERS Anxiety Depression    GI/Hepatic negative GI ROS,   Endo/Other    Renal/GU      Musculoskeletal   Abdominal   Peds  Hematology   Anesthesia Other Findings   Reproductive/Obstetrics                             Anesthesia Physical Anesthesia Plan  ASA: II  Anesthesia Plan: General   Post-op Pain Management:    Induction: Intravenous  Airway Management Planned: LMA  Additional Equipment:   Intra-op Plan:   Post-operative Plan: Extubation in OR  Informed Consent: I have reviewed the patients History and Physical, chart, labs and discussed the procedure including the risks, benefits and alternatives for the proposed anesthesia with the patient or authorized representative who has indicated his/her understanding and acceptance.     Plan Discussed with:   Anesthesia Plan Comments:         Anesthesia Quick Evaluation

## 2014-01-13 NOTE — Transfer of Care (Signed)
Immediate Anesthesia Transfer of Care Note  Patient: Christine [name]  Procedure(s) Performed: Procedure(s) with comments: DILATATION & CURETTAGE/HYSTEROSCOPY WITH THERMACHOICE ABLATION (N/A) - uteral sound is to 11cm,34ml of D5W in, 38ml out. temp:87 degree celcious total therapy time-77min, 57 sec  Patient Location: PACU  Anesthesia Type:General  Level of Consciousness: awake, alert , oriented and patient cooperative  Airway & Oxygen Therapy: Patient Spontanous Breathing and Patient connected to face mask oxygen  Post-op Assessment: Report given to PACU RN and Post -op Vital signs reviewed and stable  Post vital signs: Reviewed and stable  Complications: No apparent anesthesia complications

## 2014-01-13 NOTE — Anesthesia Procedure Notes (Signed)
Procedure Name: LMA Insertion Date/Time: 01/13/2014 11:24 AM Performed by: Andree Elk, Afnan Cadiente A Pre-anesthesia Checklist: Timeout performed, Patient identified, Emergency Drugs available, Patient being monitored and Suction available Patient Re-evaluated:Patient Re-evaluated prior to inductionOxygen Delivery Method: Circle system utilized Preoxygenation: Pre-oxygenation with 100% oxygen Intubation Type: IV induction Ventilation: Mask ventilation without difficulty LMA: LMA inserted LMA Size: 4.0 Number of attempts: 1 Placement Confirmation: positive ETCO2 and breath sounds checked- equal and bilateral Tube secured with: Tape Dental Injury: Teeth and Oropharynx as per pre-operative assessment

## 2014-01-13 NOTE — Brief Op Note (Signed)
01/13/2014  12:06 PM  PATIENT:  Christine [name]  43 y.o. female  PRE-OPERATIVE DIAGNOSIS:  MENORRHAGIA  POST-OPERATIVE DIAGNOSIS:  MENORRHAGIA  PROCEDURE:  Procedure(s) with comments: DILATATION & CURETTAGE/HYSTEROSCOPY WITH THERMACHOICE ABLATION (N/A) - uteral sound is to 11cm,20ml of D5W in, 28ml out. temp:87 degree celcious total therapy time-23min, 57 sec  SURGEON:  Surgeon(s) and Role:    * [name], MD - Primary  PHYSICIAN ASSISTANT:   ASSISTANTS: none   ANESTHESIA:   general and paracervical block  EBL:  Total I/O In: 400 [I.V.:400] Out: -   BLOOD ADMINISTERED:none  DRAINS: none   LOCAL MEDICATIONS USED:  MARCAINE    and Amount: 20 ml  SPECIMEN:  No Specimen  DISPOSITION OF SPECIMEN:  N/A  COUNTS:  YES  TOURNIQUET:  * No tourniquets in log *  DICTATION: .Dragon Dictation  PLAN OF CARE: Discharge to home after PACU  PATIENT DISPOSITION:  PACU - hemodynamically stable.   Delay start of Pharmacological VTE agent (>24hrs) due to surgical blood loss or risk of bleeding: not applicable Details of procedure: Patient was taken operating room prepped and draped for vaginal procedure. Timeout was conducted. Abdomen prepped and draped as well as perineum. Speculum was inserted, cervix grasped uterus sounded to 11 cm in the anteflexed position dilated to 23 [place] allowing introduction of the rigid 30 operative hysteroscope which identified both tubal ostia and a smooth endometrial cavity without evidence of complication or polyps. Photos were taken followed by removal of the hysteroscope, placement of the Gynecare ThermaChoice 3 endometrial ablation device into the endometrial cavity where it was placed at 11 cm depth, filled with 22 cc of D5W and the 8 minute thermal ablation sequence completed, with recovery of all fluid. Paracervical block with 20 cc of Marcaine with epinephrine was then injected at 357 and 9:00 and patient allowed to go recovery room in stable  condition she'll receive Toradol in the postoperative area, and be discharged home on oral Toradol and Percocet as needed for follow-up for postoperative cramping follow-up 2 weeks or earlier for any concern

## 2014-01-13 NOTE — Anesthesia Postprocedure Evaluation (Signed)
  Anesthesia Post-op Note  Patient: Christine [name]  Procedure(s) Performed: Procedure(s) with comments: DILATATION & CURETTAGE/HYSTEROSCOPY WITH THERMACHOICE ABLATION (N/A) - uteral sound is to 11cm,26ml of D5W in, 26ml out. temp:87 degree celcious total therapy time-74min, 57 sec  Patient Location: PACU  Anesthesia Type:General  Level of Consciousness: awake, alert , oriented and patient cooperative  Airway and Oxygen Therapy: Patient Spontanous Breathing  Post-op Pain: none  Post-op Assessment: Post-op Vital signs reviewed, Patient's Cardiovascular Status Stable, Respiratory Function Stable, Patent Airway, No signs of Nausea or vomiting and Pain level controlled  Post-op Vital Signs: Reviewed and stable  Last Vitals:  Filed Vitals:   01/13/14 1230  BP:   Pulse: 73  Temp:   Resp: 12    Complications: No apparent anesthesia complications

## 2014-01-13 NOTE — Interval H&P Note (Signed)
History and Physical Interval Note:  01/13/2014 11:07 AM  [name]  has presented today for surgery, with the diagnosis of MENORRHAGIA  The various methods of treatment have been discussed with the patient and family. After consideration of risks, benefits and other options for treatment, the patient has consented to  Procedure(s): DILATATION & CURETTAGE/HYSTEROSCOPY WITH THERMACHOICE ABLATION (N/A) as a surgical intervention .  The patient's history has been reviewed, patient examined, no change in status, stable for surgery.  I have reviewed the patient's chart and labs.  Questions were answered to the patient's satisfaction.     Christine [name]

## 2014-01-14 ENCOUNTER — Encounter (HOSPITAL_COMMUNITY): Payer: Self-pay | Admitting: Obstetrics and Gynecology

## 2014-01-20 ENCOUNTER — Encounter: Payer: BC Managed Care – PPO | Admitting: Obstetrics and Gynecology

## 2014-01-20 ENCOUNTER — Telehealth: Payer: Self-pay | Admitting: Obstetrics and Gynecology

## 2014-01-21 ENCOUNTER — Ambulatory Visit: Payer: BC Managed Care – PPO | Admitting: Obstetrics and Gynecology

## 2014-01-21 NOTE — Telephone Encounter (Signed)
Pt doing fine after surgery. Postop visit cancelled. ptwill let us know prn for any concerns.

## 2014-02-06 ENCOUNTER — Ambulatory Visit (INDEPENDENT_AMBULATORY_CARE_PROVIDER_SITE_OTHER): Payer: BC Managed Care – PPO | Admitting: Obstetrics and Gynecology

## 2014-02-06 ENCOUNTER — Encounter: Payer: Self-pay | Admitting: Obstetrics and Gynecology

## 2014-02-06 VITALS — BP 120/80 | Ht 66.0 in | Wt 243.0 lb

## 2014-02-06 DIAGNOSIS — Z9889 Other specified postprocedural states: Secondary | ICD-10-CM

## 2014-02-06 LAB — CBC WITH DIFFERENTIAL/PLATELET
BASOS ABS: 0.1 10*3/uL (ref 0.0–0.1)
Basophils Relative: 1 % (ref 0–1)
Eosinophils Absolute: 0.3 10*3/uL (ref 0.0–0.7)
Eosinophils Relative: 3 % (ref 0–5)
HCT: 37.5 % (ref 36.0–46.0)
Hemoglobin: 12.7 g/dL (ref 12.0–15.0)
Lymphocytes Relative: 26 % (ref 12–46)
Lymphs Abs: 2.8 10*3/uL (ref 0.7–4.0)
MCH: 29.3 pg (ref 26.0–34.0)
MCHC: 33.9 g/dL (ref 30.0–36.0)
MCV: 86.6 fL (ref 78.0–100.0)
MPV: 11.1 fL (ref 9.4–12.4)
Monocytes Absolute: 0.8 10*3/uL (ref 0.1–1.0)
Monocytes Relative: 7 % (ref 3–12)
NEUTROS ABS: 6.8 10*3/uL (ref 1.7–7.7)
Neutrophils Relative %: 63 % (ref 43–77)
PLATELETS: 340 10*3/uL (ref 150–400)
RBC: 4.33 MIL/uL (ref 3.87–5.11)
RDW: 13.4 % (ref 11.5–15.5)
WBC: 10.8 10*3/uL — AB (ref 4.0–10.5)

## 2014-02-06 MED ORDER — DOXYCYCLINE HYCLATE 100 MG PO CAPS: 100.0000 mg | ORAL_CAPSULE | Freq: Two times a day (BID) | ORAL | Status: DC

## 2014-02-06 MED ORDER — TRAMADOL HCL 50 MG PO TABS: 50.0000 mg | ORAL_TABLET | Freq: Four times a day (QID) | ORAL | Status: DC | PRN

## 2014-02-06 NOTE — Progress Notes (Signed)
[facility] Visit  Patient name: Christine [name] MRN [id number]  Date of birth: [birth year]  CC & HPI:  Christine [name] is a 43 y.o. female Pt here today for bleeding and cramping after ablation done 01/13/14. Pt states that the days that she didn't have bleeding but she has had a brownish discharge.   the cramping is on the left side. She is having associated symptoms of diaphoresis. She reports that she has been using Ibuprofen for the relief of her pain. She denies any fever, and any other symptoms. She was sexually active only once on 11/26 and she had pain then. She denies dyspareunia before the procedure. She voices concern for if this is BV because she frequently gets them.  ROS:  +Vaginal bleeding +Vaginal discharge +diaphoresis +Dyspareunia No other complaints.   Pertinent History Reviewed:   Reviewed: Significant for  Medical         Past Medical History  Diagnosis Date  . Migraines   . Depression   . Anxiety   . Irregular uterine bleeding                               Surgical Hx:    Past Surgical History  Procedure Laterality Date  . Tubal ligation    . Cesarean section  1994 , 1997    x 2   . Dilitation & currettage/hystroscopy with thermachoice ablation N/A 01/13/2014    Procedure: DILATATION & CURETTAGE/HYSTEROSCOPY WITH THERMACHOICE ABLATION;  Surgeon: [name], MD;  Location: [facility];  Service: Gynecology;  Laterality: N/A;  uteral sound is to 11cm,51m of D5W in, 236mout. temp:87 degree celcious total therapy time-62m27m 57 sec   Medications: Reviewed & Updated - see associated section                      Current outpatient prescriptions: escitalopram (LEXAPRO) 10 MG tablet, Take 10 mg by mouth daily. , Disp: , Rfl: ;  SUMAtriptan (IMITREX) 50 MG tablet, Take 50 mg by mouth every 2 (two) hours as needed for migraine. , Disp: , Rfl: ;  topiramate (TOPAMAX) 100 MG tablet, Take 100 mg by mouth 4 (four) times daily as needed., Disp: , Rfl:   clindamycin (CLEOCIN) 100 MG vaginal suppository, Place 1 suppository (100 mg total) vaginally at bedtime. (Patient not taking: Reported on 02/06/2014), Disp: 3 suppository, Rfl: 5   Social History: Reviewed -  reports that she has been smoking Cigarettes.  She has a 30 pack-year smoking history. She has never used smokeless tobacco.  Objective Findings:  Vitals: Blood pressure 120/80, height 5' 6"  (1.676 m), weight 243 lb (110.224 kg), last menstrual period 01/08/2014.  Physical Examination:  Pelvic - normal external genitalia, vulva, vagina, cervix, uterus and adnexa,  VULVA: normal appearing vulva with no masses, tenderness or lesions,  VAGINA: normal appearing vagina with normal color and discharge, no lesions, vaginal discharge - clear,  CERVIX: normal appearing cervix without discharge or lesions,  UTERUS: uterus is normal size, shape, consistency and tenderness upon palpation,deviated slightly to left, and pain is in to LLQ.  ADNEXA: normal adnexa in size, nontender and no masses   Assessment & Plan:   A:  1. S/p 3+ weeks endometrial ablation with prolonged slough 2. R/o postop subclinical infection.  P:  1. CBC and ESR to be completed today. 2. 10 days of antibiotics doxycycline 100 bid. 3. Tramadol Rx  4. F/U in 3 weeks

## 2014-02-07 LAB — SEDIMENTATION RATE: Sed Rate: 11 mm/hr (ref 0–22)

## 2014-02-09 ENCOUNTER — Telehealth: Payer: Self-pay | Admitting: *Deleted

## 2014-02-09 ENCOUNTER — Telehealth: Payer: Self-pay | Admitting: Obstetrics and Gynecology

## 2014-02-09 NOTE — Telephone Encounter (Signed)
Wbc 10.8 , ESR normal. Pt advised to call office if still uncomfortable.

## 2014-02-09 NOTE — Telephone Encounter (Signed)
Pt states that Dr. Glo Herring called her earlier this morning. Pt states that the antibiotic is making her sick. Pt states that she has been taking it on an empty stomach, I advised the pt to take it with food and see if it gets better. Pt verbalized understanding and will call back if things don't get better.

## 2014-07-24 ENCOUNTER — Other Ambulatory Visit (HOSPITAL_COMMUNITY): Payer: Self-pay | Admitting: Respiratory Therapy

## 2014-07-24 DIAGNOSIS — G4730 Sleep apnea, unspecified: Secondary | ICD-10-CM

## 2014-09-21 ENCOUNTER — Other Ambulatory Visit (HOSPITAL_COMMUNITY): Payer: Self-pay | Admitting: Respiratory Therapy

## 2014-10-12 ENCOUNTER — Other Ambulatory Visit (HOSPITAL_COMMUNITY): Payer: Self-pay | Admitting: Respiratory Therapy

## 2014-11-24 ENCOUNTER — Ambulatory Visit: Payer: BLUE CROSS/BLUE SHIELD | Attending: Neurology | Admitting: Sleep Medicine

## 2014-11-24 DIAGNOSIS — G4730 Sleep apnea, unspecified: Secondary | ICD-10-CM

## 2014-11-30 ENCOUNTER — Ambulatory Visit: Payer: BLUE CROSS/BLUE SHIELD | Attending: Neurology | Admitting: Sleep Medicine

## 2014-11-30 ENCOUNTER — Other Ambulatory Visit (HOSPITAL_COMMUNITY): Payer: Self-pay | Admitting: Respiratory Therapy

## 2014-11-30 DIAGNOSIS — G4730 Sleep apnea, unspecified: Secondary | ICD-10-CM | POA: Diagnosis not present

## 2014-12-12 NOTE — Sleep Study (Signed)
  [place] A. Merlene Laughter, MD     [email or web address]              HOME SLEEP STUDY   LOCATION: [place]  Patient Name: Christine [name], Christine [name] Date: 11/30/2014 Gender: Female D.O.B: 1970-09-14 Age (years): 38 Referring Provider: Phillips Odor MD, ABSM Height (inches): 41 Interpreting Physician: Phillips Odor MD, ABSM Weight (lbs): 246 RPSGT: Rosebud Poles BMI: 40 MRN: [id number] Neck Size: 16.00 CLINICAL INFORMATION Sleep Study Type: HST  Indication for sleep study: N/A  Epworth Sleepiness Score: NA  SLEEP STUDY TECHNIQUE A multi-channel overnight portable sleep study was performed. The channels recorded were: nasal airflow, thoracic respiratory movement, and oxygen saturation with a pulse oximetry. Snoring was also monitored.  MEDICATIONS Patient self administered medications include: N/A.  SLEEP ARCHITECTURE Patient was studied for 479.2 minutes.   RESPIRATORY PARAMETERS The overall AHI was 0.0 per hour, with a central apnea index of 0.0 per hour.  The oxygen nadir was 91% during sleep.  IMPRESSIONS No significant obstructive sleep apnea occurred during this study (AHI = 0.0/h). No significant central sleep apnea occurred during this study (CAI = 0.0/h). The patient had minimal or no oxygen desaturation during the study (Min O2 = 91%) No snoring was audible during this study.     Delano Metz, MD Diplomate, American Board of Sleep Medicine.

## 2015-01-25 ENCOUNTER — Other Ambulatory Visit: Payer: Self-pay | Admitting: Obstetrics & Gynecology

## 2015-03-23 ENCOUNTER — Other Ambulatory Visit: Payer: Self-pay | Admitting: Neurology

## 2015-03-23 DIAGNOSIS — R51 Headache: Principal | ICD-10-CM

## 2015-03-23 DIAGNOSIS — R519 Headache, unspecified: Secondary | ICD-10-CM

## 2015-04-07 ENCOUNTER — Other Ambulatory Visit (HOSPITAL_COMMUNITY): Payer: BLUE CROSS/BLUE SHIELD

## 2015-04-16 ENCOUNTER — Ambulatory Visit (HOSPITAL_COMMUNITY): Payer: BLUE CROSS/BLUE SHIELD

## 2018-01-03 ENCOUNTER — Encounter: Payer: Self-pay | Admitting: Family Medicine

## 2018-01-03 ENCOUNTER — Ambulatory Visit (INDEPENDENT_AMBULATORY_CARE_PROVIDER_SITE_OTHER): Payer: No Typology Code available for payment source | Admitting: Family Medicine

## 2018-01-03 VITALS — BP 116/70 | HR 90 | Temp 98.0°F | Ht 66.0 in | Wt 219.1 lb

## 2018-01-03 DIAGNOSIS — G43109 Migraine with aura, not intractable, without status migrainosus: Secondary | ICD-10-CM

## 2018-01-03 DIAGNOSIS — N921 Excessive and frequent menstruation with irregular cycle: Secondary | ICD-10-CM

## 2018-01-03 DIAGNOSIS — F411 Generalized anxiety disorder: Secondary | ICD-10-CM

## 2018-01-03 DIAGNOSIS — Z0000 Encounter for general adult medical examination without abnormal findings: Secondary | ICD-10-CM

## 2018-01-03 DIAGNOSIS — F330 Major depressive disorder, recurrent, mild: Secondary | ICD-10-CM

## 2018-01-03 DIAGNOSIS — F4481 Dissociative identity disorder: Secondary | ICD-10-CM

## 2018-01-03 DIAGNOSIS — M2550 Pain in unspecified joint: Secondary | ICD-10-CM

## 2018-01-03 MED ORDER — ESCITALOPRAM OXALATE 10 MG PO TABS: 10.0000 mg | ORAL_TABLET | Freq: Every day | ORAL | 1 refills | Status: DC

## 2018-01-03 MED ORDER — NAPROXEN 375 MG PO TBEC: DELAYED_RELEASE_TABLET | ORAL | 0 refills | Status: DC

## 2018-01-03 MED ORDER — HYDROXYZINE PAMOATE 25 MG PO CAPS: 25.0000 mg | ORAL_CAPSULE | Freq: Four times a day (QID) | ORAL | 0 refills | Status: DC | PRN

## 2018-01-03 NOTE — Assessment & Plan Note (Signed)
Refer to psych 

## 2018-01-03 NOTE — Progress Notes (Signed)
BP 116/70   Pulse 90   Temp 98 F (36.7 C) (Oral)   Ht 5\' 6"  (1.676 m)   Wt 219 lb 1.6 oz (99.4 kg)   LMP 12/24/2017   SpO2 99%   BMI 35.36 kg/m    Subjective:    Patient ID: [name], female    DOB: [birth year], 47 y.o.   MRN: [id number]  HPI: Christine [name] is a 47 y.o. female  Chief Complaint  Patient presents with  . New Patient (Initial Visit)    HPI Patient is here to establish care  She has a hx of migraines, "debilitating"; they can come on for strong smells; lights bother her; she gets nausea and vomiting and dizzines; visual aura different places; saw neurologist recently and he put her on maxalt; she feels when they come and it drifts away; saw Dr. 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; stabbing pains in the top of the right foot, worse on the right, didn't hurt this bad until after cyst removed; both feet have burening on the bottom; tried meloxicam but caused weight gain; can tolerate aleve, no stomach acid or abd pain  Multiple personality disorder, anxiety; diagnosed by primary doctor during a previous job in [place]; saw her for everything; stopped going to neurologist and everything; then switched insurance; just from her depression; has had a nervous breakdown; does get depressed, chronic, just unipolar depression; probably runs in the family; generalized anxiety disorder; just feels like she needs to run, not sure what sets it off, took Zoloft and it made her feel like she wanted to drive her truck  into a tree; just the thought, not intentional suicidal, didn't want to kill herself  Due for physical  Not quite ready to quit smoking; down from 1 ppd to 1/2 ppd; first cig of the day is 15 minutes after waking; last cig is maybe 15 minutes before bed; no other smokers really in life; has to walk to smoke at work  Depression screen [facility] 2/9 01/03/2018  Decreased Interest 0  Down, Depressed, Hopeless 0  PHQ - 2 Score 0  Altered sleeping 0  Tired, decreased energy 1  Change in appetite 0  Feeling bad or failure about yourself  0  Trouble concentrating 0  Suicidal thoughts 0  PHQ-9 Score 1  Difficult doing work/chores Not difficult at all   Fall Risk  01/03/2018  Falls in the past year? 0  Number falls in past yr: 0  Injury with Fall? 0    Relevant past medical, surgical, family and social history reviewed Past Medical History:  Diagnosis Date  . Anxiety   . Arthritis   . Depression   . Irregular uterine bleeding   . Migraines   . Multiple personality disorder [facility])    Past Surgical History:  Procedure Laterality Date  . [place]   x 2   . DILITATION & CURRETTAGE/HYSTROSCOPY WITH THERMACHOICE ABLATION N/A 01/13/2014  Procedure: DILATATION & CURETTAGE/HYSTEROSCOPY WITH THERMACHOICE ABLATION;  Surgeon: [name], MD;  Location: [facility];  Service: Gynecology;  Laterality: N/A;  uteral sound is to 11cm,23ml of D5W in, 67ml out. temp:87 degree celcious total therapy time-49min, 57 sec  . TUBAL LIGATION     Family History  Problem Relation Age of Onset  . Hypertension Mother   . Heart attack Father   . Stroke Maternal Grandmother   . Heart attack Maternal Grandfather    Social History   Tobacco Use  . Smoking status: Current Every Day Smoker    Packs/day: 1.00    Years: 30.00    Pack years: 30.00    Types: Cigarettes  . Smokeless tobacco: Never Used  Substance Use Topics  . Alcohol use: No  . Drug use: No     Office Visit from 01/03/2018  in [facility]  AUDIT-C Score  0      Interim medical history since last visit reviewed. Allergies and medications reviewed  Review of Systems Per HPI unless specifically indicated above     Objective:    BP 116/70   Pulse 90   Temp 98 F (36.7 C) (Oral)   Ht 5\' 6"  (1.676 m)   Wt 219 lb 1.6 oz (99.4 kg)   LMP 12/24/2017   SpO2 99%   BMI 35.36 kg/m   Wt Readings from Last 3 Encounters:  01/03/18 219 lb 1.6 oz (99.4 kg)  12/07/14 243 lb (110.2 kg)  11/24/14 243 lb (110.2 kg)    Physical Exam  Constitutional: She appears well-developed and well-nourished. No distress.  HENT:  Head: Normocephalic and atraumatic.  Eyes: EOM are normal. No scleral icterus.  Neck: No thyromegaly present.  Cardiovascular: Normal rate, regular rhythm and normal heart sounds.  No murmur heard. Pulmonary/Chest: Effort normal and breath sounds normal. No respiratory distress. She has no wheezes.  Abdominal: Soft. Bowel sounds are normal. She exhibits no distension.  Musculoskeletal: She exhibits no edema.  Neurological: She is alert.  Skin: Skin is warm and dry. She is not diaphoretic. No pallor.  Psychiatric: She has a normal mood and affect. Her behavior is normal. Judgment and thought content normal.      Assessment & Plan:   Problem List Items Addressed This Visit      Other   Multiple personality disorder ([place])    Refer to psych      Relevant Orders   Ambulatory referral to Psychiatry   Mild episode of recurrent major depressive disorder ([place])    Refer to psychiatrist; refill lexapro and pt to contact psych if hypomanic, go to ER if outright manic or psychotic      Relevant Medications   hydrOXYzine (VISTARIL) 25 MG capsule   escitalopram (LEXAPRO) 10 MG tablet   Other Relevant Orders   Ambulatory referral to Psychiatry   Menorrhagia with irregular cycle    Check TSH and return for pap smear      Generalized anxiety disorder    Continue the lexapro;  explained I do not prescribe benzo; willing to try hydroxyzine; do not drive if groggy      Relevant Medications   hydrOXYzine (VISTARIL) 25 MG capsule   escitalopram (LEXAPRO) 10 MG tablet   Other Relevant Orders   Ambulatory referral to Psychiatry    Other Visit Diagnoses    Migraine with aura and without status migrainosus, not intractable    -  Primary   Relevant Medications   rizatriptan (MAXALT-MLT)  10 MG disintegrating tablet   clonazePAM (KLONOPIN) 1 MG tablet   escitalopram (LEXAPRO) 10 MG tablet   Naproxen 375 MG TBEC   Other Relevant Orders   Ambulatory referral to Neurology   Arthralgia of multiple sites       feet, hips, hands; check labs today; request records from ortho   Relevant Orders   VITAMIN D 25 Hydroxy (Vit-D Deficiency, Fractures) (Completed)   ANA,IFA RA Diag Pnl w/rflx Tit/Patn (Completed)   Uric acid (Completed)   C-reactive protein (Completed)   Preventative health care       Relevant Orders   CBC with Differential/Platelet (Completed)   COMPLETE METABOLIC PANEL WITH GFR (Completed)   Lipid panel (Completed)   TSH (Completed)       Follow up plan: Return in about 4 weeks (around 01/31/2018) for complete physical.  An after-visit summary was printed and given to the patient at [facility].  Please see the patient instructions which may contain other information and recommendations beyond what is mentioned above in the assessment and plan.  Meds ordered this encounter  Medications  . hydrOXYzine (VISTARIL) 25 MG capsule    Sig: Take 1 capsule (25 mg total) by mouth every 6 (six) hours as needed.    Dispense:  30 capsule    Refill:  0  . escitalopram (LEXAPRO) 10 MG tablet    Sig: Take 1 tablet (10 mg total) by mouth daily.    Dispense:  30 tablet    Refill:  1  . Naproxen 375 MG TBEC    Sig: Take one by mouth every 12 hours if needed; take with food; tylenol is okay per package directions    Dispense:  60 each    Refill:  0    Orders  Placed This Encounter  Procedures  . CBC with Differential/Platelet  . COMPLETE METABOLIC PANEL WITH GFR  . Lipid panel  . TSH  . VITAMIN D 25 Hydroxy (Vit-D Deficiency, Fractures)  . ANA,IFA RA Diag Pnl w/rflx Tit/Patn  . Uric acid  . C-reactive protein  . Ambulatory referral to Neurology  . Ambulatory referral to Psychiatry

## 2018-01-03 NOTE — Assessment & Plan Note (Signed)
Refer to psychiatrist; refill lexapro and pt to contact psych if hypomanic, go to ER if outright manic or psychotic

## 2018-01-03 NOTE — Assessment & Plan Note (Signed)
Continue the lexapro; explained I do not prescribe benzo; willing to try hydroxyzine; do not drive if groggy

## 2018-01-03 NOTE — Assessment & Plan Note (Signed)
Check TSH and return for pap smear

## 2018-01-03 NOTE — Patient Instructions (Addendum)
Request xray report and office notes from [facility] (ortho)  Return soon for a complete physical  I do encourage you to quit smoking Call [phone] to sign up for smoking cessation classes You can call 1-800-QUIT-NOW to talk with a smoking cessation coach   Steps to Quit Smoking Smoking tobacco can be bad for your health. It can also affect almost every organ in your body. Smoking puts you and people around you at risk for many serious long-lasting (chronic) diseases. Quitting smoking is hard, but it is one of the best things that you can do for your health. It is never too late to quit. What are the benefits of quitting smoking? When you quit smoking, you lower your risk for getting serious diseases and conditions. They can include:  Lung cancer or lung disease.  Heart disease.  Stroke.  Heart attack.  Not being able to have children (infertility).  Weak bones (osteoporosis) and broken bones (fractures).  If you have coughing, wheezing, and shortness of breath, those symptoms may get better when you quit. You may also get sick less often. If you are pregnant, quitting smoking can help to lower your chances of having a baby of low birth weight. What can I do to help me quit smoking? Talk with your doctor about what can help you quit smoking. Some things you can do (strategies) include:  Quitting smoking totally, instead of slowly cutting back how much you smoke over a period of time.  Going to in-person counseling. You are more likely to quit if you go to many counseling sessions.  Using resources and support systems, such as: ? Database administrator with a Social worker. ? Phone quitlines. ? Careers information officer. ? Support groups or group counseling. ? Text messaging programs. ? Mobile phone apps or applications.  Taking medicines. Some of these medicines may have nicotine in them. If you are pregnant or breastfeeding, do not take any medicines to quit smoking unless your doctor  says it is okay. Talk with your doctor about counseling or other things that can help you.  Talk with your doctor about using more than one strategy at the same time, such as taking medicines while you are also going to in-person counseling. This can help make quitting easier. What things can I do to make it easier to quit? Quitting smoking might feel very hard at first, but there is a lot that you can do to make it easier. Take these steps:  Talk to your family and friends. Ask them to support and encourage you.  Call phone quitlines, reach out to support groups, or work with a Social worker.  Ask people who smoke to not smoke around you.  Avoid places that make you want (trigger) to smoke, such as: ? Bars. ? Parties. ? Smoke-break areas at work.  Spend time with people who do not smoke.  Lower the stress in your life. Stress can make you want to smoke. Try these things to help your stress: ? Getting regular exercise. ? Deep-breathing exercises. ? Yoga. ? Meditating. ? Doing a body scan. To do this, close your eyes, focus on one area of your body at a time from head to toe, and notice which parts of your body are tense. Try to relax the muscles in those areas.  Download or buy apps on your mobile phone or tablet that can help you stick to your quit plan. There are many free apps, such as QuitGuide from the [employer] Office manager for Disease Control  and Prevention). You can find more support from smokefree.gov and other websites.  This information is not intended to replace advice given to you by your health care provider. Make sure you discuss any questions you have with your health care provider. Document Released: 12/10/2008 Document Revised: 10/12/2015 Document Reviewed: 06/30/2014 Elsevier Interactive Patient Education  2018 Elsevier Inc.  

## 2018-01-07 LAB — LIPID PANEL
CHOLESTEROL: 160 mg/dL (ref <200)
HDL: 44 mg/dL — AB (ref >50)
LDL Cholesterol (Calc): 99 mg/dL (calc)
Non-HDL Cholesterol (Calc): 116 mg/dL (calc) (ref <130)
Total CHOL/HDL Ratio: 3.6 (calc) (ref <5.0)
Triglycerides: 77 mg/dL (ref <150)

## 2018-01-07 LAB — COMPLETE METABOLIC PANEL WITH GFR
AG Ratio: 1.6 (calc) (ref 1.0–2.5)
ALBUMIN MSPROF: 3.9 g/dL (ref 3.6–5.1)
ALT: 11 U/L (ref 6–29)
AST: 12 U/L (ref 10–35)
Alkaline phosphatase (APISO): 63 U/L (ref 33–115)
BUN: 10 mg/dL (ref 7–25)
CALCIUM: 9.1 mg/dL (ref 8.6–10.2)
CHLORIDE: 106 mmol/L (ref 98–110)
CO2: 26 mmol/L (ref 20–32)
CREATININE: 0.75 mg/dL (ref 0.50–1.10)
GFR, EST NON AFRICAN AMERICAN: 94 mL/min/{1.73_m2} (ref >=60)
GFR, Est African American: 109 mL/min/{1.73_m2} (ref >=60)
GLOBULIN: 2.4 g/dL (ref 1.9–3.7)
GLUCOSE: 94 mg/dL (ref 65–99)
Potassium: 4.2 mmol/L (ref 3.5–5.3)
SODIUM: 137 mmol/L (ref 135–146)
Total Bilirubin: 0.2 mg/dL (ref 0.2–1.2)
Total Protein: 6.3 g/dL (ref 6.1–8.1)

## 2018-01-07 LAB — CBC WITH DIFFERENTIAL/PLATELET
BASOS PCT: 1 %
Basophils Absolute: 69 cells/uL (ref 0–200)
Eosinophils Absolute: 269 cells/uL (ref 15–500)
Eosinophils Relative: 3.9 %
HEMATOCRIT: 38.1 % (ref 35.0–45.0)
HEMOGLOBIN: 12.9 g/dL (ref 11.7–15.5)
LYMPHS ABS: 1987 {cells}/uL (ref 850–3900)
MCH: 29.7 pg (ref 27.0–33.0)
MCHC: 33.9 g/dL (ref 32.0–36.0)
MCV: 87.6 fL (ref 80.0–100.0)
MPV: 11.4 fL (ref 7.5–12.5)
Monocytes Relative: 8.2 %
Neutro Abs: 4009 cells/uL (ref 1500–7800)
Neutrophils Relative %: 58.1 %
Platelets: 277 10*3/uL (ref 140–400)
RBC: 4.35 10*6/uL (ref 3.80–5.10)
RDW: 12 % (ref 11.0–15.0)
Total Lymphocyte: 28.8 %
WBC: 6.9 10*3/uL (ref 3.8–10.8)
WBCMIX: 566 {cells}/uL (ref 200–950)

## 2018-01-07 LAB — C-REACTIVE PROTEIN: CRP: 1 mg/L (ref <8.0)

## 2018-01-07 LAB — VITAMIN D 25 HYDROXY (VIT D DEFICIENCY, FRACTURES): Vit D, 25-Hydroxy: 15 ng/mL — ABNORMAL LOW (ref 30–100)

## 2018-01-07 LAB — ANA,IFA RA DIAG PNL W/RFLX TIT/PATN: ANA: NEGATIVE

## 2018-01-07 LAB — TSH: TSH: 2.03 mIU/L

## 2018-01-07 LAB — URIC ACID: URIC ACID, SERUM: 4.1 mg/dL (ref 2.5–7.0)

## 2018-01-08 ENCOUNTER — Other Ambulatory Visit: Payer: Self-pay | Admitting: Family Medicine

## 2018-01-08 MED ORDER — VITAMIN D (ERGOCALCIFEROL) 1.25 MG (50000 UNIT) PO CAPS: 50000.0000 [IU] | ORAL_CAPSULE | ORAL | 1 refills | Status: AC

## 2018-01-08 NOTE — Progress Notes (Signed)
Rx for vitamin D sent weekly x 8 weeks, then [phone] iu vit D3 daily if not getting much sun

## 2018-02-01 ENCOUNTER — Telehealth: Payer: Self-pay

## 2018-02-01 NOTE — Telephone Encounter (Signed)
Copied from [place] [phone]. Topic: Quick Communication - See Telephone Encounter >> Feb 01, 2018  3:49 PM Nils Flack wrote: CRM for notification. See Telephone encounter for: 02/01/18. Received fax that pt never returned calls to Dr Nicolasa Ducking office. They have closed referral on their end.  Do you want me to send elsewhere?

## 2018-02-01 NOTE — Telephone Encounter (Signed)
Please call patient and find out the story Maybe she has a different phone number and she never got their calls, or she doesn't want to go (?) Refer to another office if she desires

## 2018-02-04 NOTE — Telephone Encounter (Signed)
Left detailed voicemail

## 2018-02-07 ENCOUNTER — Other Ambulatory Visit (HOSPITAL_COMMUNITY)
Admission: RE | Admit: 2018-02-07 | Discharge: 2018-02-07 | Disposition: A | Discharge status: Home / Self Care | Payer: No Typology Code available for payment source | Source: Ambulatory Visit | Attending: Family Medicine | Admitting: Family Medicine

## 2018-02-07 ENCOUNTER — Other Ambulatory Visit: Payer: Self-pay | Admitting: Family Medicine

## 2018-02-07 ENCOUNTER — Encounter: Payer: Self-pay | Admitting: Family Medicine

## 2018-02-07 ENCOUNTER — Ambulatory Visit (INDEPENDENT_AMBULATORY_CARE_PROVIDER_SITE_OTHER): Payer: No Typology Code available for payment source | Admitting: Family Medicine

## 2018-02-07 VITALS — BP 124/70 | HR 80 | Temp 98.2°F | Ht 66.0 in | Wt 221.6 lb

## 2018-02-07 DIAGNOSIS — B9689 Other specified bacterial agents as the cause of diseases classified elsewhere: Secondary | ICD-10-CM | POA: Diagnosis present

## 2018-02-07 DIAGNOSIS — Z1239 Encounter for other screening for malignant neoplasm of breast: Secondary | ICD-10-CM

## 2018-02-07 DIAGNOSIS — Z85828 Personal history of other malignant neoplasm of skin: Secondary | ICD-10-CM

## 2018-02-07 DIAGNOSIS — Z124 Encounter for screening for malignant neoplasm of cervix: Secondary | ICD-10-CM

## 2018-02-07 DIAGNOSIS — N760 Acute vaginitis: Secondary | ICD-10-CM | POA: Diagnosis not present

## 2018-02-07 DIAGNOSIS — Z113 Encounter for screening for infections with a predominantly sexual mode of transmission: Secondary | ICD-10-CM | POA: Diagnosis present

## 2018-02-07 DIAGNOSIS — E669 Obesity, unspecified: Secondary | ICD-10-CM

## 2018-02-07 DIAGNOSIS — Z0000 Encounter for general adult medical examination without abnormal findings: Secondary | ICD-10-CM | POA: Diagnosis not present

## 2018-02-07 NOTE — Patient Instructions (Addendum)
I will recommend that you pick up the prescription vitamin D, take once a week for eight weeks, then just take 800 iu of vitamin D3 daily  I do encourage you to quit smoking Call [phone] to sign up for smoking cessation classes You can call 1-800-QUIT-NOW to talk with a smoking cessation coach   Steps to Quit Smoking Smoking tobacco can be bad for your health. It can also affect almost every organ in your body. Smoking puts you and people around you at risk for many serious long-lasting (chronic) diseases. Quitting smoking is hard, but it is one of the best things that you can do for your health. It is never too late to quit. What are the benefits of quitting smoking? When you quit smoking, you lower your risk for getting serious diseases and conditions. They can include:  Lung cancer or lung disease.  Heart disease.  Stroke.  Heart attack.  Not being able to have children (infertility).  Weak bones (osteoporosis) and broken bones (fractures).  If you have coughing, wheezing, and shortness of breath, those symptoms may get better when you quit. You may also get sick less often. If you are pregnant, quitting smoking can help to lower your chances of having a baby of low birth weight. What can I do to help me quit smoking? Talk with your doctor about what can help you quit smoking. Some things you can do (strategies) include:  Quitting smoking totally, instead of slowly cutting back how much you smoke over a period of time.  Going to in-person counseling. You are more likely to quit if you go to many counseling sessions.  Using resources and support systems, such as: ? Database administrator with a Social worker. ? Phone quitlines. ? Careers information officer. ? Support groups or group counseling. ? Text messaging programs. ? Mobile phone apps or applications.  Taking medicines. Some of these medicines may have nicotine in them. If you are pregnant or breastfeeding, do not take any  medicines to quit smoking unless your doctor says it is okay. Talk with your doctor about counseling or other things that can help you.  Talk with your doctor about using more than one strategy at the same time, such as taking medicines while you are also going to in-person counseling. This can help make quitting easier. What things can I do to make it easier to quit? Quitting smoking might feel very hard at first, but there is a lot that you can do to make it easier. Take these steps:  Talk to your family and friends. Ask them to support and encourage you.  Call phone quitlines, reach out to support groups, or work with a Social worker.  Ask people who smoke to not smoke around you.  Avoid places that make you want (trigger) to smoke, such as: ? Bars. ? Parties. ? Smoke-break areas at work.  Spend time with people who do not smoke.  Lower the stress in your life. Stress can make you want to smoke. Try these things to help your stress: ? Getting regular exercise. ? Deep-breathing exercises. ? Yoga. ? Meditating. ? Doing a body scan. To do this, close your eyes, focus on one area of your body at a time from head to toe, and notice which parts of your body are tense. Try to relax the muscles in those areas.  Download or buy apps on your mobile phone or tablet that can help you stick to your quit plan. There are many free  apps, such as QuitGuide from the [employer] Office manager for Disease Control and Prevention). You can find more support from smokefree.gov and other websites.  This information is not intended to replace advice given to you by your health care provider. Make sure you discuss any questions you have with your health care provider. Document Released: 12/10/2008 Document Revised: 10/12/2015 Document Reviewed: 06/30/2014 Elsevier Interactive Patient Education  2018 [place] Risks of Smoking Smoking cigarettes is very bad for your health. Tobacco smoke has over 200 known  poisons in it. It contains the poisonous gases nitrogen oxide and carbon monoxide. There are over 60 chemicals in tobacco smoke that cause cancer. Smoking is difficult to quit because a chemical in tobacco, called nicotine, causes addiction or dependence. When you smoke and inhale, nicotine is absorbed rapidly into the bloodstream through your lungs. Both inhaled and non-inhaled nicotine may be addictive. What are the risks of cigarette smoke? Cigarette smokers have an increased risk of many serious medical problems, including:  Lung cancer.  Lung disease, such as pneumonia, bronchitis, and emphysema.  Chest pain (angina) and heart attack because the heart is not getting enough oxygen.  Heart disease and peripheral blood vessel disease.  High blood pressure (hypertension).  Stroke.  Oral cancer, including cancer of the lip, mouth, or voice box.  Bladder cancer.  Pancreatic cancer.  Cervical cancer.  Pregnancy complications, including premature birth.  Stillbirths and smaller newborn babies, birth defects, and genetic damage to sperm.  Early menopause.  Lower estrogen level for women.  Infertility.  Facial wrinkles.  Blindness.  Increased risk of broken bones (fractures).  Senile dementia.  Stomach ulcers and internal bleeding.  Delayed wound healing and increased risk of complications during surgery.  Even smoking lightly shortens your life expectancy by several years.  Because of secondhand smoke exposure, children of smokers have an increased risk of the following:  Sudden infant death syndrome (SIDS).  Respiratory infections.  Lung cancer.  Heart disease.  Ear infections.  What are the benefits of quitting? There are many health benefits of quitting smoking. Here are some of them:  Within days of quitting smoking, your risk of having a heart attack decreases, your blood flow improves, and your lung capacity improves. Blood pressure, pulse rate, and  breathing patterns start returning to normal soon after quitting.  Within months, your lungs may clear up completely.  Quitting for 10 years reduces your risk of developing lung cancer and heart disease to almost that of a nonsmoker.  People who quit may see an improvement in their overall quality of life.  How do I quit smoking? Smoking is an addiction with both physical and psychological effects, and longtime habits can be hard to change. Your health care provider can recommend:  Programs and community resources, which may include group support, education, or talk therapy.  Prescription medicines to help reduce cravings.  Nicotine replacement products, such as patches, gum, and nasal sprays. Use these products only as directed. Do not replace cigarette smoking with electronic cigarettes, which are commonly called e-cigarettes. The safety of e-cigarettes is not known, and some may contain harmful chemicals.  A combination of two or more of these methods.  Where to find more information:  American Lung Association: www.lung.org  American Cancer Society: www.cancer.org Summary  Smoking cigarettes is very bad for your health. Cigarette smokers have an increased risk of many serious medical problems, including several cancers, heart disease, and stroke.  Smoking is an addiction with both physical  and psychological effects, and longtime habits can be hard to change.  By stopping right away, you can greatly reduce the risk of medical problems for you and your family.  To help you quit smoking, your health care provider can recommend programs, community resources, prescription medicines, and nicotine replacement products such as patches, gum, and nasal sprays. This information is not intended to replace advice given to you by your health care provider. Make sure you discuss any questions you have with your health care provider. Document Released: 03/23/2004 Document Revised: 02/18/2016  Document Reviewed: 02/18/2016 Elsevier Interactive Patient Education  2017 [place] Maintenance, Female Adopting a healthy lifestyle and getting preventive care can go a long way to promote health and wellness. Talk with your health care provider about what schedule of regular examinations is right for you. This is a good chance for you to check in with your provider about disease prevention and staying healthy. In between checkups, there are plenty of things you can do on your own. Experts have done a lot of research about which lifestyle changes and preventive measures are most likely to keep you healthy. Ask your health care provider for more information. Weight and diet Eat a healthy diet  Be sure to include plenty of vegetables, fruits, low-fat dairy products, and lean protein.  Do not eat a lot of foods high in solid fats, added sugars, or salt.  Get regular exercise. This is one of the most important things you can do for your health. ? Most adults should exercise for at least 150 minutes each week. The exercise should increase your heart rate and make you sweat (moderate-intensity exercise). ? Most adults should also do strengthening exercises at least twice a week. This is in addition to the moderate-intensity exercise.  Maintain a healthy weight  Body mass index (BMI) is a measurement that can be used to identify possible weight problems. It estimates body fat based on height and weight. Your health care provider can help determine your BMI and help you achieve or maintain a healthy weight.  For females 32 years of age and older: ? A BMI below 18.5 is considered underweight. ? A BMI of 18.5 to 24.9 is normal. ? A BMI of 25 to 29.9 is considered overweight. ? A BMI of 30 and above is considered obese.  Watch levels of cholesterol and blood lipids  You should start having your blood tested for lipids and cholesterol at 48 years of age, then have this test every 5  years.  You may need to have your cholesterol levels checked more often if: ? Your lipid or cholesterol levels are high. ? You are older than 48 years of age. ? You are at high risk for heart disease.  Cancer screening Lung Cancer  Lung cancer screening is recommended for adults 62-20 years old who are at high risk for lung cancer because of a history of smoking.  A yearly low-dose CT scan of the lungs is recommended for people who: ? Currently smoke. ? Have quit within the past 15 years. ? Have at least a 30-pack-year history of smoking. A pack year is smoking an average of one pack of cigarettes a day for 1 year.  Yearly screening should continue until it has been 15 years since you quit.  Yearly screening should stop if you develop a health problem that would prevent you from having lung cancer treatment.  Breast Cancer  Practice breast self-awareness. This means understanding how your  breasts normally appear and feel.  It also means doing regular breast self-exams. Let your health care provider know about any changes, no matter how small.  If you are in your 20s or 30s, you should have a clinical breast exam (CBE) by a health care provider every 1-3 years as part of a regular health exam.  If you are 35 or older, have a CBE every year. Also consider having a breast X-ray (mammogram) every year.  If you have a family history of breast cancer, talk to your health care provider about genetic screening.  If you are at high risk for breast cancer, talk to your health care provider about having an MRI and a mammogram every year.  Breast cancer gene (BRCA) assessment is recommended for women who have family members with BRCA-related cancers. BRCA-related cancers include: ? Breast. ? Ovarian. ? Tubal. ? Peritoneal cancers.  Results of the assessment will determine the need for genetic counseling and BRCA1 and BRCA2 testing.  Cervical Cancer Your health care provider may  recommend that you be screened regularly for cancer of the pelvic organs (ovaries, uterus, and vagina). This screening involves a pelvic examination, including checking for microscopic changes to the surface of your cervix (Pap test). You may be encouraged to have this screening done every 3 years, beginning at age 68.  For women ages 43-65, health care providers may recommend pelvic exams and Pap testing every 3 years, or they may recommend the Pap and pelvic exam, combined with testing for human papilloma virus (HPV), every 5 years. Some types of HPV increase your risk of cervical cancer. Testing for HPV may also be done on women of any age with unclear Pap test results.  Other health care providers may not recommend any screening for nonpregnant women who are considered low risk for pelvic cancer and who do not have symptoms. Ask your health care provider if a screening pelvic exam is right for you.  If you have had past treatment for cervical cancer or a condition that could lead to cancer, you need Pap tests and screening for cancer for at least 20 years after your treatment. If Pap tests have been discontinued, your risk factors (such as having a new sexual partner) need to be reassessed to determine if screening should resume. Some women have medical problems that increase the chance of getting cervical cancer. In these cases, your health care provider may recommend more frequent screening and Pap tests.  Colorectal Cancer  This type of cancer can be detected and often prevented.  Routine colorectal cancer screening usually begins at 48 years of age and continues through 49 years of age.  Your health care provider may recommend screening at an earlier age if you have risk factors for colon cancer.  Your health care provider may also recommend using home test kits to check for hidden blood in the stool.  A small camera at the end of a tube can be used to examine your colon directly  (sigmoidoscopy or colonoscopy). This is done to check for the earliest forms of colorectal cancer.  Routine screening usually begins at age 52.  Direct examination of the colon should be repeated every 5-10 years through 48 years of age. However, you may need to be screened more often if early forms of precancerous polyps or small growths are found.  Skin Cancer  Check your skin from head to toe regularly.  Tell your health care provider about any new moles or changes in  moles, especially if there is a change in a mole's shape or color.  Also tell your health care provider if you have a mole that is larger than the size of a pencil eraser.  Always use sunscreen. Apply sunscreen liberally and repeatedly throughout the day.  Protect yourself by wearing long sleeves, pants, a wide-brimmed hat, and sunglasses whenever you are outside.  Heart disease, diabetes, and high blood pressure  High blood pressure causes heart disease and increases the risk of stroke. High blood pressure is more likely to develop in: ? People who have blood pressure in the high end of the normal range (130-139/85-89 mm Hg). ? People who are overweight or obese. ? People who are African American.  If you are 87-69 years of age, have your blood pressure checked every 3-5 years. If you are 60 years of age or older, have your blood pressure checked every year. You should have your blood pressure measured twice-once when you are at a hospital or clinic, and once when you are not at a hospital or clinic. Record the average of the two measurements. To check your blood pressure when you are not at a hospital or clinic, you can use: ? An automated blood pressure machine at a pharmacy. ? A home blood pressure monitor.  If you are between 23 years and 12 years old, ask your health care provider if you should take aspirin to prevent strokes.  Have regular diabetes screenings. This involves taking a blood sample to check your  fasting blood sugar level. ? If you are at a normal weight and have a low risk for diabetes, have this test once every three years after 48 years of age. ? If you are overweight and have a high risk for diabetes, consider being tested at a younger age or more often. Preventing infection Hepatitis B  If you have a higher risk for hepatitis B, you should be screened for this virus. You are considered at high risk for hepatitis B if: ? You were born in a country where hepatitis B is common. Ask your health care provider which countries are considered high risk. ? Your parents were born in a high-risk country, and you have not been immunized against hepatitis B (hepatitis B vaccine). ? You have HIV or AIDS. ? You use needles to inject street drugs. ? You live with someone who has hepatitis B. ? You have had sex with someone who has hepatitis B. ? You get hemodialysis treatment. ? You take certain medicines for conditions, including cancer, organ transplantation, and autoimmune conditions.  Hepatitis C  Blood testing is recommended for: ? Everyone born from [birth year] through 1966. ? Anyone with known risk factors for hepatitis C.  Sexually transmitted infections (STIs)  You should be screened for sexually transmitted infections (STIs) including gonorrhea and chlamydia if: ? You are sexually active and are younger than 48 years of age. ? You are older than 48 years of age and your health care provider tells you that you are at risk for this type of infection. ? Your sexual activity has changed since you were last screened and you are at an increased risk for chlamydia or gonorrhea. Ask your health care provider if you are at risk.  If you do not have HIV, but are at risk, it may be recommended that you take a prescription medicine daily to prevent HIV infection. This is called pre-exposure prophylaxis (PrEP). You are considered at risk if: ? You are sexually  active and do not regularly use condoms  or know the HIV status of your partner(s). ? You take drugs by injection. ? You are sexually active with a partner who has HIV.  Talk with your health care provider about whether you are at high risk of being infected with HIV. If you choose to begin PrEP, you should first be tested for HIV. You should then be tested every 3 months for as long as you are taking PrEP. Pregnancy  If you are premenopausal and you may become pregnant, ask your health care provider about preconception counseling.  If you may become pregnant, take 400 to 800 micrograms (mcg) of folic acid every day.  If you want to prevent pregnancy, talk to your health care provider about birth control (contraception). Osteoporosis and menopause  Osteoporosis is a disease in which the bones lose minerals and strength with aging. This can result in serious bone fractures. Your risk for osteoporosis can be identified using a bone density scan.  If you are 42 years of age or older, or if you are at risk for osteoporosis and fractures, ask your health care provider if you should be screened.  Ask your health care provider whether you should take a calcium or vitamin D supplement to lower your risk for osteoporosis.  Menopause may have certain physical symptoms and risks.  Hormone replacement therapy may reduce some of these symptoms and risks. Talk to your health care provider about whether hormone replacement therapy is right for you. Follow these instructions at home:  Schedule regular health, dental, and eye exams.  Stay current with your immunizations.  Do not use any tobacco products including cigarettes, chewing tobacco, or electronic cigarettes.  If you are pregnant, do not drink alcohol.  If you are breastfeeding, limit how much and how often you drink alcohol.  Limit alcohol intake to no more than 1 drink per day for nonpregnant women. One drink equals 12 ounces of beer, 5 ounces of wine, or 1 ounces of hard  liquor.  Do not use street drugs.  Do not share needles.  Ask your health care provider for help if you need support or information about quitting drugs.  Tell your health care provider if you often feel depressed.  Tell your health care provider if you have ever been abused or do not feel safe at home. This information is not intended to replace advice given to you by your health care provider. Make sure you discuss any questions you have with your health care provider. Document Released: 08/29/2010 Document Revised: 07/22/2015 Document Reviewed: 11/17/2014 Elsevier Interactive Patient Education  2018 [employer].  Obesity, Adult Obesity is the condition of having too much total body fat. Being overweight or obese means that your weight is greater than what is considered healthy for your body size. Obesity is determined by a measurement called BMI. BMI is an estimate of body fat and is calculated from height and weight. For adults, a BMI of 30 or higher is considered obese. Obesity can eventually lead to other health concerns and major illnesses, including:  Stroke.  Coronary artery disease (CAD).  Type 2 diabetes.  Some types of cancer, including cancers of the colon, breast, uterus, and gallbladder.  Osteoarthritis.  High blood pressure (hypertension).  High cholesterol.  Sleep apnea.  Gallbladder stones.  Infertility problems.  What are the causes? The main cause of obesity is taking in (consuming) more calories than your body uses for energy. Other factors that contribute  to this condition may include:  Being born with genes that make you more likely to become obese.  Having a medical condition that causes obesity. These conditions include: ? Hypothyroidism. ? Polycystic ovarian syndrome (PCOS). ? Binge-eating disorder. ? Cushing syndrome.  Taking certain medicines, such as steroids, antidepressants, and seizure medicines.  Not being physically active  (sedentary lifestyle).  Living where there are limited places to exercise safely or buy healthy foods.  Not getting enough sleep.  What increases the risk? The following factors may increase your risk of this condition:  Having a family history of obesity.  Being a woman of African-American descent.  Being a man of Hispanic descent.  What are the signs or symptoms? Having excessive body fat is the main symptom of this condition. How is this diagnosed? This condition may be diagnosed based on:  Your symptoms.  Your medical history.  A physical exam. Your health care provider may measure: ? Your BMI. If you are an adult with a BMI between 25 and less than 30, you are considered overweight. If you are an adult with a BMI of 30 or higher, you are considered obese. ? The distances around your hips and your waist (circumferences). These may be compared to each other to help diagnose your condition. ? Your skinfold thickness. Your health care provider may gently pinch a fold of your skin and measure it.  How is this treated? Treatment for this condition often includes changing your lifestyle. Treatment may include some or all of the following:  Dietary changes. Work with your health care provider and a dietitian to set a weight-loss goal that is healthy and reasonable for you. Dietary changes may include eating: ? Smaller portions. A portion size is the amount of a particular food that is healthy for you to eat at one time. This varies from person to person. ? Low-calorie or low-fat options. ? More whole grains, fruits, and vegetables.  Regular physical activity. This may include aerobic activity (cardio) and strength training.  Medicine to help you lose weight. Your health care provider may prescribe medicine if you are unable to lose 1 pound a week after 6 weeks of eating more healthily and doing more physical activity.  Surgery. Surgical options may include gastric banding and  gastric bypass. Surgery may be done if: ? Other treatments have not helped to improve your condition. ? You have a BMI of 40 or higher. ? You have life-threatening health problems related to obesity.  Follow these instructions at home:  Eating and drinking   Follow recommendations from your health care provider about what you eat and drink. Your health care provider may advise you to: ? Limit fast foods, sweets, and processed snack foods. ? Choose low-fat options, such as low-fat milk instead of whole milk. ? Eat 5 or more servings of fruits or vegetables every day. ? Eat at home more often. This gives you more control over what you eat. ? Choose healthy foods when you eat out. ? Learn what a healthy portion size is. ? Keep low-fat snacks on hand. ? Avoid sugary drinks, such as soda, fruit juice, iced tea sweetened with sugar, and flavored milk. ? Eat a healthy breakfast.  Drink enough water to keep your urine clear or pale yellow.  Do not go without eating for long periods of time (do not fast) or follow a fad diet. Fasting and fad diets can be unhealthy and even dangerous. Physical Activity  Exercise regularly, as told  by your health care provider. Ask your health care provider what types of exercise are safe for you and how often you should exercise.  Warm up and stretch before being active.  Cool down and stretch after being active.  Rest between periods of activity. Lifestyle  Limit the time that you spend in front of your TV, computer, or video game system.  Find ways to reward yourself that do not involve food.  Limit alcohol intake to no more than 1 drink a day for nonpregnant women and 2 drinks a day for men. One drink equals 12 oz of beer, 5 oz of wine, or 1 oz of hard liquor. General instructions  Keep a weight loss journal to keep track of the food you eat and how much you exercise you get.  Take over-the-counter and prescription medicines only as told by your  health care provider.  Take vitamins and supplements only as told by your health care provider.  Consider joining a support group. Your health care provider may be able to recommend a support group.  Keep all follow-up visits as told by your health care provider. This is important. Contact a health care provider if:  You are unable to meet your weight loss goal after 6 weeks of dietary and lifestyle changes. This information is not intended to replace advice given to you by your health care provider. Make sure you discuss any questions you have with your health care provider. Document Released: 03/23/2004 Document Revised: 07/19/2015 Document Reviewed: 12/02/2014 Elsevier Interactive Patient Education  2018 [place].  Preventing Unhealthy [employer], Adult Staying at a healthy weight is important. When fat builds up in your body, you may become overweight or obese. These conditions put you at greater risk for developing certain health problems, such as heart disease, diabetes, sleeping problems, joint problems, and some cancers. Unhealthy weight gain is often the result of making unhealthy choices in what you eat. It is also a result of not getting enough exercise. You can make changes to your lifestyle to prevent obesity and stay as healthy as possible. What nutrition changes can be made? To maintain a healthy weight and prevent obesity:  Eat only as much as your body needs. To do this: ? Pay attention to signs that you are hungry or full. Stop eating as soon as you feel full. ? If you feel hungry, try drinking water first. Drink enough water so your urine is clear or pale yellow. ? Eat smaller portions. ? Look at serving sizes on food labels. Most foods contain more than one serving per container. ? Eat the recommended amount of calories for your gender and activity level. While most active people should eat around 2,000 calories per day, if you are trying to lose weight or are not  very active, you main need to eat less calories. Talk to your health care provider or dietitian about how many calories you should eat each day.  Choose healthy foods, such as: ? Fruits and vegetables. Try to fill at least half of your plate at each meal with fruits and vegetables. ? Whole grains, such as whole wheat bread, brown rice, and quinoa. ? Lean meats, such as chicken or fish. ? Other healthy proteins, such as beans, eggs, or tofu. ? Healthy fats, such as nuts, seeds, fatty fish, and olive oil. ? Low-fat or fat-free dairy.  Check food labels and avoid food and drinks that: ? Are high in calories. ? Have added sugar. ? Are  high in sodium. ? Have saturated fats or trans fats.  Limit how much you eat of the following foods: ? Prepackaged meals. ? Fast food. ? Fried foods. ? Processed meat, such as bacon, sausage, and deli meats. ? Fatty cuts of red meat and poultry with skin.  Cook foods in healthier ways, such as by baking, broiling, or grilling.  When grocery shopping, try to shop around the outside of the store. This helps you buy mostly fresh foods and avoid canned and prepackaged foods.  What lifestyle changes can be made?  Exercise at least 30 minutes 5 or more days each week. Exercising includes brisk walking, yard work, biking, running, swimming, and team sports like basketball and soccer. Ask your health care provider which exercises are safe for you.  Do not use any products that contain nicotine or tobacco, such as cigarettes and e-cigarettes. If you need help quitting, ask your health care provider.  Limit alcohol intake to no more than 1 drink a day for nonpregnant women and 2 drinks a day for men. One drink equals 12 oz of beer, 5 oz of wine, or 1 oz of hard liquor.  Try to get 7-9 hours of sleep each night. What other changes can be made?  Keep a food and activity journal to keep track of: ? What you ate and how many calories you had. Remember to count  sauces, dressings, and side dishes. ? Whether you were active, and what exercises you did. ? Your calorie, weight, and activity goals.  Check your weight regularly. Track any changes. If you notice you have gained weight, make changes to your diet or activity routine.  Avoid taking weight-loss medicines or supplements. Talk to your health care provider before starting any new medicine or supplement.  Talk to your health care provider before trying any new diet or exercise plan. Why are these changes important? Eating healthy, staying active, and having healthy habits not only help prevent obesity, they also:  Help you to manage stress and emotions.  Help you to connect with friends and family.  Improve your self-esteem.  Improve your sleep.  Prevent long-term health problems.  What can happen if changes are not made? Being obese or overweight can cause you to develop joint or bone problems, which can make it hard for you to stay active or do activities you enjoy. Being obese or overweight also puts stress on your heart and lungs and can lead to health problems like diabetes, heart disease, and some cancers. Where to find more information: Talk with your health care provider or a dietitian about healthy eating and healthy lifestyle choices. You may also find other information through these resources:  U.S. Department of Agriculture MyPlate: FormerBoss.no  American Heart Association: www.heart.org  Centers for Disease Control and Prevention: http://www.wolf.info/  Summary  Staying at a healthy weight is important. It helps prevent certain diseases and health problems, such as heart disease, diabetes, joint problems, sleep disorders, and some cancers.  Being obese or overweight can cause you to develop joint or bone problems, which can make it hard for you to stay active or do activities you enjoy.  You can prevent unhealthy weight gain by eating a healthy diet, exercising  regularly, not smoking, limiting alcohol, and getting enough sleep.  Talk with your health care provider or a dietitian for guidance about healthy eating and healthy lifestyle choices. This information is not intended to replace advice given to you by your health care provider. Make  sure you discuss any questions you have with your health care provider. Document Released: 02/15/2016 Document Revised: 03/22/2016 Document Reviewed: 03/22/2016 Elsevier Interactive Patient Education  [employer].

## 2018-02-07 NOTE — Progress Notes (Signed)
BP 124/70   Pulse 80   Temp 98.2 F (36.8 C) (Oral)   Ht _0  (1.676 m)   Wt 221 lb 9.6 oz (100.5 kg)   LMP 01/12/2018   SpO2 98%   BMI 35.77 kg/m    Subjective:    Patient ID: Christine [name], female    DOB: [birth year], 47 y.o.   MRN: [id number]  HPI: Christine [name] is a 47 y.o. female  Chief Complaint  Patient presents with  . Annual Exam    HPI  USPSTF grade A and B recommendations Depression:  Depression screen [facility] 2/9 02/07/2018 01/03/2018  Decreased Interest 0 0  Down, Depressed, Hopeless 0 0  PHQ - 2 Score 0 0  Altered sleeping 0 0  Tired, decreased energy 0 1  Change in appetite 0 0  Feeling bad or failure about yourself  0 0  Trouble concentrating 0 0  Suicidal thoughts 0 0  PHQ-9 Score 0 1  Difficult doing work/chores Not difficult at all Not difficult at all   Hypertension: BP Readings from Last 3 Encounters:  02/07/18 124/70  01/03/18 116/70  02/06/14 120/80   Obesity: Wt Readings from Last 3 Encounters:  02/07/18 221 lb 9.6 oz (100.5 kg)  01/03/18 219 lb 1.6 oz (99.4 kg)  12/07/14 243 lb (110.2 kg)   BMI Readings from Last 3 Encounters:  02/07/18 35.77 kg/m  01/03/18 35.36 kg/m  12/07/14 39.22 kg/m     Skin cancer: no worrisome moles; has hx of skin cancer on the left cheek; SCC; saw derm  Lung cancer:  Smoking; smokes about 5 cigs a day; really cut down; can't smoke indoors; not ready to quit Breast cancer: CMA to order today; has had several biopsies, benign Colorectal cancer: no fam hx of colon cancer Cervical cancer screening: today; last abnormal pap smear was maybe 6-7 years ago; had uterine ablation; heavy bleeding; chronic BV BRCA gene screening: family hx of breast and/or ovarian cancer and/or metastatic prostate cancer? no HIV, hep B, hep C: yes STD testing and prevention (chl/gon/syphilis): yes Intimate partner violence: no abuse Contraception: tubes are tied Osteoporosis: no steroids Fall prevention/vitamin D:  discussed; not much sun Immunizations: flu shot UTD; tetanus UTD for work Diet: regular typical eater Exercise: gets 10-15k steps a day Alcohol:    Office Visit from 02/07/2018 in [facility]  AUDIT-C Score  0      Tobacco use: current, but cutting back AAA: n/a Aspirin: n/a Glucose: checked recently Glucose, Bld  Date Value Ref Range Status  01/03/2018 94 65 - 99 mg/dL Final    Comment:    .            Fasting reference interval .    Lipids:  Lab Results  Component Value Date   CHOL 160 01/03/2018   Lab Results  Component Value Date   HDL 44 (L) 01/03/2018   Lab Results  Component Value Date   LDLCALC 99 01/03/2018   Lab Results  Component Value Date   TRIG 77 01/03/2018   Lab Results  Component Value Date   CHOLHDL 3.6 01/03/2018   No results found for: LDLDIRECT   Depression screen [facility] 2/9 02/07/2018 01/03/2018  Decreased Interest 0 0  Down, Depressed, Hopeless 0 0  PHQ - 2 Score 0 0  Altered sleeping 0 0  Tired, decreased energy 0 1  Change in appetite 0 0  Feeling bad or failure about yourself  0 0  Trouble concentrating  0 0  Suicidal thoughts 0 0  PHQ-9 Score 0 1  Difficult doing work/chores Not difficult at all Not difficult at all   Fall Risk  02/07/2018 02/07/2018 01/03/2018  Falls in the past year? 0 0 0  Number falls in past yr: - - 0  Injury with Fall? - - 0    Relevant past medical, surgical, family and social history reviewed Past Medical History:  Diagnosis Date  . Anxiety   . Arthritis   . Depression   . Irregular uterine bleeding   . Migraines   . Multiple personality disorder [facility])    Past Surgical History:  Procedure Laterality Date  . [place]   x 2   . DILITATION & CURRETTAGE/HYSTROSCOPY WITH THERMACHOICE ABLATION N/A 01/13/2014   Procedure: DILATATION & CURETTAGE/HYSTEROSCOPY WITH THERMACHOICE ABLATION;  Surgeon: [name], MD;  Location: [facility];  Service: Gynecology;   Laterality: N/A;  uteral sound is to 11cm,17m of D5W in, 268mout. temp:87 degree celcious total therapy time-71m11m 57 sec  . TUBAL LIGATION     Family History  Problem Relation Age of Onset  . Hypertension Mother   . Heart attack Father   . Stroke Maternal Grandmother   . Heart attack Maternal Grandfather    Social History   Tobacco Use  . Smoking status: Current Every Day Smoker    Packs/day: 1.00    Years: 30.00    Pack years: 30.00    Types: Cigarettes  . Smokeless tobacco: Never Used  Substance Use Topics  . Alcohol use: No  . Drug use: No     [facility]      Interim medical history since last visit reviewed. Allergies and medications reviewed  Review of Systems  Constitutional: Negative for unexpected weight change.  HENT: Negative for hearing loss.   Eyes: Negative for visual disturbance.  Gastrointestinal: Negative for blood in stool.  Endocrine: Positive for polydipsia (more thirsty; switched from diet Mt. Dew to water).  Genitourinary: Negative for hematuria.  Hematological: Negative for adenopathy.   Per HPI unless specifically indicated above     Objective:    BP 124/70   Pulse 80   Temp 98.2 F (36.8 C) (Oral)   Ht _0  (1.676 m)   Wt 221 lb 9.6 oz (100.5 kg)   LMP 01/12/2018   SpO2 98%   BMI 35.77 kg/m   Wt Readings from Last 3 Encounters:  02/07/18 221 lb 9.6 oz (100.5 kg)  01/03/18 219 lb 1.6 oz (99.4 kg)  12/07/14 243 lb (110.2 kg)    Physical Exam Constitutional:      Appearance: She is well-developed. She is obese.  HENT:     Head: Normocephalic and atraumatic.  Eyes:     General: No scleral icterus.       Right eye: No hordeolum.        Left eye: No hordeolum.     Conjunctiva/sclera: Conjunctivae normal.  Neck:     Thyroid: No thyromegaly.     Vascular: No carotid bruit.  Cardiovascular:     Rate and Rhythm: Normal rate and regular rhythm.  No  extrasystoles are present.    Heart sounds: Normal heart sounds, S1 normal and S2 normal.  Pulmonary:     Effort: Pulmonary effort is normal. No respiratory distress.     Breath sounds: Normal breath sounds.  Chest:  Breasts: Breasts are symmetrical.        Right: Inverted nipple present. No mass, nipple discharge, skin change or tenderness.        Left: Inverted nipple present. No mass, nipple discharge, skin change or tenderness.     Comments: Nipples have been inverted for many many years per patient, no chnages Abdominal:     General: Bowel sounds are normal. There is no distension or abdominal bruit.     Palpations: Abdomen is soft. There is no mass or pulsatile mass.     Tenderness: There is no abdominal tenderness.     Hernia: No hernia is present.  Genitourinary:    Exam position: Prone.     Labia:        Right: No rash or lesion.        Left: No rash or lesion.      Cervix: No cervical motion tenderness.     Adnexa:        Right: No mass, tenderness or fullness.         Left: No mass, tenderness or fullness.    Musculoskeletal: Normal range of motion.  Lymphadenopathy:     Head:     Right side of head: No submandibular adenopathy.     Left side of head: No submandibular adenopathy.     Cervical: No cervical adenopathy.  Skin:    General: Skin is warm and dry.     Coloration: Skin is not pale.     Findings: No bruising or ecchymosis.  Neurological:     Mental Status: She is alert.     Cranial Nerves: No cranial nerve deficit.     Motor: No tremor or abnormal muscle tone.     Gait: Gait normal.  Psychiatric:        Mood and Affect: Mood normal. Mood is not anxious or depressed.        Speech: Speech normal.        Behavior: Behavior normal.        Thought Content: Thought content normal.     Results for orders placed or performed in visit on 01/03/18  CBC with Differential/Platelet  Result Value Ref Range   WBC 6.9 3.8 - 10.8 Thousand/uL   RBC 4.35 3.80 -  5.10 Million/uL   Hemoglobin 12.9 11.7 - 15.5 g/dL   HCT 38.1 35.0 - 45.0 %   MCV 87.6 80.0 - 100.0 fL   MCH 29.7 27.0 - 33.0 pg   MCHC 33.9 32.0 - 36.0 g/dL   RDW 12.0 11.0 - 15.0 %   Platelets 277 140 - 400 Thousand/uL   MPV 11.4 7.5 - 12.5 fL   Neutro Abs 4,009 1,500 - 7,800 cells/uL   Lymphs Abs 1,987 850 - 3,900 cells/uL   WBC mixed population 566 200 - 950 cells/uL   Eosinophils Absolute 269 15 - 500 cells/uL   Basophils Absolute 69 0 - 200 cells/uL   Neutrophils Relative % 58.1 %   Total Lymphocyte 28.8 %   Monocytes Relative 8.2 %   Eosinophils Relative 3.9 %   Basophils Relative 1.0 %  COMPLETE METABOLIC PANEL WITH GFR  Result Value Ref Range   Glucose, Bld 94 65 - 99 mg/dL   BUN 10 7 - 25 mg/dL   Creat 0.75 0.50 - 1.10 mg/dL   GFR, Est Non African American 94 > OR = 60 mL/min/1.48m   GFR, Est African American 109 > OR = 60 mL/min/1.765m  BUN/Creatinine Ratio  NOT APPLICABLE 6 - 22 (calc)   Sodium 137 135 - 146 mmol/L   Potassium 4.2 3.5 - 5.3 mmol/L   Chloride 106 98 - 110 mmol/L   CO2 26 20 - 32 mmol/L   Calcium 9.1 8.6 - 10.2 mg/dL   Total Protein 6.3 6.1 - 8.1 g/dL   Albumin 3.9 3.6 - 5.1 g/dL   Globulin 2.4 1.9 - 3.7 g/dL (calc)   AG Ratio 1.6 1.0 - 2.5 (calc)   Total Bilirubin 0.2 0.2 - 1.2 mg/dL   Alkaline phosphatase (APISO) 63 33 - 115 U/L   AST 12 10 - 35 U/L   ALT 11 6 - 29 U/L  Lipid panel  Result Value Ref Range   Cholesterol 160 <200 mg/dL   HDL 44 (L) >50 mg/dL   Triglycerides 77 <150 mg/dL   LDL Cholesterol (Calc) 99 mg/dL (calc)   Total CHOL/HDL Ratio 3.6 <5.0 (calc)   Non-HDL Cholesterol (Calc) 116 <130 mg/dL (calc)  TSH  Result Value Ref Range   TSH 2.03 mIU/L  VITAMIN D 25 Hydroxy (Vit-D Deficiency, Fractures)  Result Value Ref Range   Vit D, 25-Hydroxy 15 (L) 30 - 100 ng/mL  ANA,IFA RA Diag Pnl w/rflx Tit/Patn  Result Value Ref Range   Anti Nuclear Antibody(ANA) NEGATIVE NEGATIVE   Rhuematoid fact SerPl-aCnc <14 <93 IU/mL    Cyclic Citrullin Peptide Ab <16 UNITS   INTERPRETATION    Uric acid  Result Value Ref Range   Uric Acid, Serum 4.1 2.5 - 7.0 mg/dL  C-reactive protein  Result Value Ref Range   CRP 1.0 <8.0 mg/L      Assessment & Plan:   Problem List Items Addressed This Visit      Other   Preventative health care - Primary    USPSTF grade A and B recommendations reviewed with patient; age-appropriate recommendations, preventive care, screening tests, etc discussed and encouraged; healthy living encouraged; see AVS for patient education given to patient       Obesity (BMI 30.0-34.9)    Encouraged weight loss; see AVS       Other Visit Diagnoses    Hx of nonmelanoma skin cancer       Relevant Orders   Ambulatory referral to Dermatology   Screening for cervical cancer       Relevant Orders   Cytology - PAP   Bacterial vaginosis       Relevant Orders   Cytology - PAP   Screen for STD (sexually transmitted disease)       Relevant Orders   Cytology - PAP   Hepatitis panel, acute   HIV Antibody (routine testing w rflx)   RPR   Screening for breast cancer       Relevant Orders   MM 3D SCREEN BREAST BILATERAL       Follow up plan: Return in about 1 year (around 02/08/2019) for complete physical.  An after-visit summary was printed and given to the patient at [place].  Please see the patient instructions which may contain other information and recommendations beyond what is mentioned above in the assessment and plan.  No orders of the defined types were placed in this encounter.   Orders Placed This Encounter  Procedures  . MM 3D SCREEN BREAST BILATERAL  . Hepatitis panel, acute  . HIV Antibody (routine testing w rflx)  . RPR  . Ambulatory referral to Dermatology

## 2018-02-08 LAB — RPR: RPR: NONREACTIVE

## 2018-02-08 LAB — HEPATITIS PANEL, ACUTE
HEP B C IGM: NONREACTIVE
HEP C AB: NONREACTIVE
Hep A IgM: NONREACTIVE
Hepatitis B Surface Ag: NONREACTIVE
SIGNAL TO CUT-OFF: 0.05 (ref <1.00)

## 2018-02-08 LAB — HIV ANTIBODY (ROUTINE TESTING W REFLEX): HIV 1&2 Ab, 4th Generation: NONREACTIVE

## 2018-02-09 DIAGNOSIS — Z0000 Encounter for general adult medical examination without abnormal findings: Secondary | ICD-10-CM | POA: Insufficient documentation

## 2018-02-09 DIAGNOSIS — E669 Obesity, unspecified: Secondary | ICD-10-CM | POA: Insufficient documentation

## 2018-02-09 NOTE — Assessment & Plan Note (Signed)
USPSTF grade A and B recommendations reviewed with patient; age-appropriate recommendations, preventive care, screening tests, etc discussed and encouraged; healthy living encouraged; see AVS for patient education given to patient  

## 2018-02-09 NOTE — Assessment & Plan Note (Signed)
Encouraged weight loss; see AVS 

## 2018-02-12 ENCOUNTER — Other Ambulatory Visit: Payer: Self-pay | Admitting: Family Medicine

## 2018-02-12 LAB — CYTOLOGY - PAP
Bacterial vaginitis: POSITIVE — AB
CANDIDA VAGINITIS: NEGATIVE
Chlamydia: NEGATIVE
DIAGNOSIS: NEGATIVE
HPV: NOT DETECTED
Neisseria Gonorrhea: NEGATIVE
TRICH (WINDOWPATH): NEGATIVE

## 2018-02-12 MED ORDER — METRONIDAZOLE 500 MG PO TABS: 500.0000 mg | ORAL_TABLET | Freq: Two times a day (BID) | ORAL | 0 refills | Status: DC

## 2018-02-12 NOTE — Progress Notes (Signed)
Rx for metronidazole

## 2018-03-29 ENCOUNTER — Ambulatory Visit (INDEPENDENT_AMBULATORY_CARE_PROVIDER_SITE_OTHER): Payer: No Typology Code available for payment source | Admitting: Family Medicine

## 2018-03-29 ENCOUNTER — Encounter: Payer: Self-pay | Admitting: Family Medicine

## 2018-03-29 VITALS — BP 132/78 | HR 76 | Temp 98.4°F | Resp 12 | Ht 66.0 in | Wt 217.1 lb

## 2018-03-29 DIAGNOSIS — J029 Acute pharyngitis, unspecified: Secondary | ICD-10-CM | POA: Diagnosis not present

## 2018-03-29 LAB — POCT RAPID STREP A (OFFICE): Rapid Strep A Screen: NEGATIVE

## 2018-03-29 MED ORDER — AMOXICILLIN-POT CLAVULANATE 875-125 MG PO TABS: 1.0000 | ORAL_TABLET | Freq: Two times a day (BID) | ORAL | 0 refills | Status: DC

## 2018-03-29 MED ORDER — FLUCONAZOLE 150 MG PO TABS: 150.0000 mg | ORAL_TABLET | Freq: Once | ORAL | 0 refills | Status: AC

## 2018-03-29 NOTE — Patient Instructions (Signed)
Out of work until Monday Try vitamin C (orange juice if not diabetic or vitamin C tablets) and drink green tea to help your immune system during your illness Get plenty of rest and hydration Start the antibiotics Please do eat yogurt or kimchi or take a probiotic daily for the next month We want to replace the healthy germs in the gut If you notice foul, watery diarrhea in the next two months, schedule an appointment RIGHT AWAY or go to an urgent care or the emergency room if a holiday or over a weekend

## 2018-03-29 NOTE — Progress Notes (Signed)
BP 132/78   Pulse 76   Temp 98.4 F (36.9 C) (Oral)   Resp 12   Ht 5\' 6"  (1.676 m)   Wt 217 lb 1.6 oz (98.5 kg)   LMP 02/28/2018   SpO2 98%   BMI 35.04 kg/m    Subjective:    Patient ID: Christine [name], female    DOB: 1971-02-05, 48 y.o.   MRN: [id number]  HPI: Christine [name] is a 48 y.o. female  Chief Complaint  Patient presents with  . URI    bilateral ear pain and sorethroat, onset 1 week    HPI Patient is here for a sick visit The left ear and left side of the throat hurts Yesterday, sneezed and it hurt really bad Left ear hurts No drainage Feels swollen No exposure to known strep Has not really tried anything Drank some coffee, no difference No fevers Really tired, just wants to sleep, 15 hours with Fit Bit No rash No travel No body aches sinsues are a little stopped up  Depression screen [facility] 2/9 03/29/2018 02/07/2018 01/03/2018  Decreased Interest 0 0 0  Down, Depressed, Hopeless 0 0 0  PHQ - 2 Score 0 0 0  Altered sleeping 0 0 0  Tired, decreased energy 0 0 1  Change in appetite 0 0 0  Feeling bad or failure about yourself  0 0 0  Trouble concentrating 0 0 0  Suicidal thoughts 0 0 0  PHQ-9 Score 0 0 1  Difficult doing work/chores Not difficult at all Not difficult at all Not difficult at all   Fall Risk  03/29/2018 02/07/2018 02/07/2018 01/03/2018  Falls in the past year? 0 0 0 0  Number falls in past yr: 0 - - 0  Injury with Fall? 0 - - 0    Relevant past medical, surgical, family and social history reviewed Past Medical History:  Diagnosis Date  . Anxiety   . Arthritis   . Depression   . Irregular uterine bleeding   . Migraines   . 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 Smoking status: Current Every Day Smoker    Packs/day: 0.50    Years: 30.00    Pack years: 15.00    Types: Cigarettes  . Smokeless tobacco: Never Used  Substance Use Topics  . Alcohol use: No  . Drug use: No     Office Visit  from 03/29/2018 in [facility]  AUDIT-C Score  0      Interim medical history since last visit reviewed. Allergies and medications reviewed  Review of Systems Per HPI unless specifically indicated above     Objective:    BP 132/78   Pulse 76   Temp 98.4 F (36.9 C) (Oral)   Resp 12   Ht 5\' 6"  (1.676 m)   Wt 217 lb 1.6 oz (98.5 kg)   LMP 02/28/2018   SpO2 98%   BMI 35.04 kg/m   Wt Readings from Last 3 Encounters:  03/29/18 217 lb 1.6 oz (98.5 kg)  02/07/18 221 lb 9.6 oz (100.5 kg)  01/03/18 219 lb 1.6 oz (99.4 kg)    Physical Exam Constitutional:      Appearance: She is well-developed. She is obese.  HENT:     Nose: Rhinorrhea present.     Mouth/Throat:     Pharynx: Posterior oropharyngeal erythema present. No oropharyngeal exudate.  Eyes:     General: No scleral icterus. Cardiovascular:  Rate and Rhythm: Normal rate and regular rhythm.  Pulmonary:     Effort: Pulmonary effort is normal.     Breath sounds: Normal breath sounds.  Neurological:     Mental Status: She is alert.  Psychiatric:        Behavior: Behavior normal.       Assessment & Plan:   Problem List Items Addressed This Visit    None    Visit Diagnoses    Sore throat    -  Primary   ddx includes strep; it is Friday; if sx persist, then would check for mono; rest, hydration, see AVS, risk of C diff discussed   Relevant Orders   POCT rapid strep A (Completed)   Culture, Group A Strep (Completed)       Follow up plan: No follow-ups on file.  An after-visit summary was printed and given to the patient at [place].  Please see the patient instructions which may contain other information and recommendations beyond what is mentioned above in the assessment and plan.  Meds ordered this encounter  Medications  . amoxicillin-clavulanate (AUGMENTIN) 875-125 MG tablet    Sig: Take 1 tablet by mouth 2 (two) times daily.    Dispense:  20 tablet    Refill:  0  .  fluconazole (DIFLUCAN) 150 MG tablet    Sig: Take 1 tablet (150 mg total) by mouth once for 1 dose.    Dispense:  1 tablet    Refill:  0    Orders Placed This Encounter  Procedures  . Culture, Group A Strep  . POCT rapid strep A

## 2018-03-31 LAB — CULTURE, GROUP A STREP
MICRO NUMBER:: 135679
SPECIMEN QUALITY: ADEQUATE

## 2018-04-15 ENCOUNTER — Ambulatory Visit (INDEPENDENT_AMBULATORY_CARE_PROVIDER_SITE_OTHER): Payer: No Typology Code available for payment source | Admitting: Family Medicine

## 2018-04-15 ENCOUNTER — Encounter: Payer: Self-pay | Admitting: Family Medicine

## 2018-04-15 VITALS — BP 120/76 | HR 68 | Temp 98.0°F | Resp 12 | Ht 66.0 in | Wt 215.3 lb

## 2018-04-15 DIAGNOSIS — H9202 Otalgia, left ear: Secondary | ICD-10-CM

## 2018-04-15 DIAGNOSIS — Z720 Tobacco use: Secondary | ICD-10-CM

## 2018-04-15 DIAGNOSIS — J029 Acute pharyngitis, unspecified: Secondary | ICD-10-CM | POA: Diagnosis not present

## 2018-04-15 DIAGNOSIS — F17200 Nicotine dependence, unspecified, uncomplicated: Secondary | ICD-10-CM | POA: Insufficient documentation

## 2018-04-15 NOTE — Assessment & Plan Note (Signed)
Patient is unfortunately not interested in quitting smoking right now; briefly discussed risk of throat cancer with smoking and she says maybe this is the kick that she needs to quit; referred to ENT for evaluation

## 2018-04-15 NOTE — Patient Instructions (Signed)
We'll get you to an [place] specialist, first available Call if needed in the meantime

## 2018-04-15 NOTE — Progress Notes (Signed)
BP 120/76   Pulse 68   Temp 98 F (36.7 C) (Oral)   Resp 12   Ht 5\' 6"  (1.676 m)   Wt 215 lb 4.8 oz (97.7 kg)   LMP 03/30/2018   SpO2 98%   BMI 34.75 kg/m    Subjective:    Patient ID: Christine [name], female    DOB: [birth year], 48 y.o.   MRN: [id number]  HPI: Christine [name] is a 48 y.o. female  Chief Complaint  Patient presents with  . Ear Pain    left     HPI She was diagnosed with a sinus infection and had left ear pain back two weeks ago Took antibiotics and no help at all No better, no worse No fevers No travel Still feels swollen, pain with swallowing Treated with augmentin; no yeast infection from that Just the left side of the ear and throat are affected The left ear pops, equalizes from time to time Not muffled and hearing is otherwise okay Runny nose She continues to smoking, but down to 1/2 ppd from 1 ppd She is not ready to quit right now, maybe in the future  Depression screen [facility] 2/9 04/15/2018 03/29/2018 02/07/2018 01/03/2018  Decreased Interest 0 0 0 0  Down, Depressed, Hopeless 0 0 0 0  PHQ - 2 Score 0 0 0 0  Altered sleeping 0 0 0 0  Tired, decreased energy 0 0 0 1  Change in appetite 0 0 0 0  Feeling bad or failure about yourself  0 0 0 0  Trouble concentrating 0 0 0 0  Suicidal thoughts 0 0 0 0  PHQ-9 Score 0 0 0 1  Difficult doing work/chores Not difficult at all Not difficult at all Not difficult at all Not difficult at all   Fall Risk  04/15/2018 03/29/2018 02/07/2018 02/07/2018 01/03/2018  Falls in the past year? 0 0 0 0 0  Number falls in past yr: 0 0 - - 0  Injury with Fall? 0 0 - - 0    Relevant past medical, surgical, family and social history reviewed Past Medical History:  Diagnosis Date  . Anxiety   . Arthritis   . Depression   . Irregular uterine bleeding   . Migraines   . Multiple personality disorder [facility])    Past Surgical History:  Procedure Laterality Date  . [place]   x 2   . DILITATION &  CURRETTAGE/HYSTROSCOPY WITH THERMACHOICE ABLATION N/A 01/13/2014   Procedure: DILATATION & CURETTAGE/HYSTEROSCOPY WITH THERMACHOICE ABLATION;  Surgeon: [name], MD;  Location: [facility];  Service: Gynecology;  Laterality: N/A;  uteral sound is to 11cm,32ml of D5W in, 63ml out. temp:87 degree celcious total therapy time-34min, 57 sec  . TUBAL LIGATION     Family History  Problem Relation Age of Onset  . Hypertension Mother   . Heart attack Father   . Stroke Maternal Grandmother   . Heart attack Maternal Grandfather    Social History   Tobacco Use  . Smoking status: Current Every Day Smoker    Packs/day: 0.50    Years: 30.00    Pack years: 15.00    Types: Cigarettes  . Smokeless tobacco: Never Used  Substance Use Topics  . Alcohol use: No  . Drug use: No     Office Visit from 04/15/2018 in [facility]  AUDIT-C Score  0      Interim medical history since last visit reviewed. Allergies  and medications reviewed  Review of Systems Per HPI unless specifically indicated above     Objective:    BP 120/76   Pulse 68   Temp 98 F (36.7 C) (Oral)   Resp 12   Ht 5\' 6"  (1.676 m)   Wt 215 lb 4.8 oz (97.7 kg)   LMP 03/30/2018   SpO2 98%   BMI 34.75 kg/m   Wt Readings from Last 3 Encounters:  04/15/18 215 lb 4.8 oz (97.7 kg)  03/29/18 217 lb 1.6 oz (98.5 kg)  02/07/18 221 lb 9.6 oz (100.5 kg)    Physical Exam Constitutional:      Appearance: She is well-developed. She is obese.  HENT:     Right Ear: Tympanic membrane and ear canal normal. No drainage or tenderness. No middle ear effusion. Tympanic membrane is not perforated, erythematous or retracted.     Left Ear: Ear canal normal. No drainage or tenderness.  No middle ear effusion. Tympanic membrane is not perforated, erythematous or retracted.     Nose: No mucosal edema or rhinorrhea.     Mouth/Throat:     Mouth: Mucous membranes are moist.     Pharynx: No oropharyngeal exudate or posterior  oropharyngeal erythema.  Eyes:     General: No scleral icterus. Cardiovascular:     Rate and Rhythm: Normal rate and regular rhythm.  Pulmonary:     Effort: Pulmonary effort is normal.     Breath sounds: Normal breath sounds.  Neurological:     Mental Status: She is alert.  Psychiatric:        Behavior: Behavior normal.     Results for orders placed or performed in visit on 03/29/18  Culture, Group A Strep  Result Value Ref Range   MICRO NUMBER: [id number]    SPECIMEN QUALITY: Adequate    SOURCE: THROAT    STATUS: FINAL    RESULT: No group A Streptococcus isolated   POCT rapid strep A  Result Value Ref Range   Rapid Strep A Screen Negative Negative      Assessment & Plan:   Problem List Items Addressed This Visit      Other   Tobacco abuse    Patient is unfortunately not interested in quitting smoking right now; briefly discussed risk of throat cancer with smoking and she says maybe this is the kick that she needs to quit; referred to ENT for evaluation       Other Visit Diagnoses    Otalgia of left ear    -  Primary   nothing visible on exam, may be ETD or referred pain; with throat pain ongoing, through, in a smoker will refer to ENT   Relevant Orders   Ambulatory referral to ENT   Pharyngitis, unspecified etiology       ongoing pain in a smoker; untouched with augmentin; refer to ENT   Relevant Orders   Ambulatory referral to ENT       Follow up plan: No follow-ups on file.  An after-visit summary was printed and given to the patient at [facility].  Please see the patient instructions which may contain other information and recommendations beyond what is mentioned above in the assessment and plan.  No orders of the defined types were placed in this encounter.   Orders Placed This Encounter  Procedures  . Ambulatory referral to ENT

## 2018-07-04 ENCOUNTER — Ambulatory Visit: Payer: No Typology Code available for payment source | Admitting: Family Medicine

## 2018-07-04 ENCOUNTER — Other Ambulatory Visit: Payer: Self-pay

## 2018-07-04 ENCOUNTER — Encounter: Payer: Self-pay | Admitting: Family Medicine

## 2018-07-04 VITALS — BP 132/76 | HR 81 | Temp 97.6°F | Resp 16 | Ht 66.0 in | Wt 209.5 lb

## 2018-07-04 DIAGNOSIS — N6320 Unspecified lump in the left breast, unspecified quadrant: Secondary | ICD-10-CM

## 2018-07-04 DIAGNOSIS — N6311 Unspecified lump in the right breast, upper outer quadrant: Secondary | ICD-10-CM | POA: Diagnosis not present

## 2018-07-04 DIAGNOSIS — N6325 Unspecified lump in the left breast, overlapping quadrants: Secondary | ICD-10-CM

## 2018-07-04 NOTE — Progress Notes (Signed)
Name: Christine [name]   MRN: [id number]    DOB: Apr 13, 1970   Date:07/04/2018       Progress Note  Subjective  Chief Complaint  Chief Complaint  Patient presents with  . Breast Mass    Found Sunday hard knots on bilateral breast and would like to have them checked out.    HPI  Breast lumps: she states she was playing at home, she did not have a bra on and noticed a lump on left breast, she asked boyfriend and daughter to check her breast and they also felt the lump. Mild discomfort when she palpates the area, she has chronic brown nipple discharge - she states for many years and unchanged. No change of the skin of her breast , no axillary lymphadenopathy. She has lost 12 lbs since December 2019 however she states she started a keto diet January 1 st, 2020.    Patient Active Problem List   Diagnosis Date Noted  . Tobacco abuse 04/15/2018  . Preventative health care 02/09/2018  . Obesity (BMI 30.0-34.9) 02/09/2018  . Multiple personality disorder ([place]) 01/03/2018  . Mild episode of recurrent major depressive disorder ([place]) 01/03/2018  . Generalized anxiety disorder 01/03/2018  . Menorrhagia with regular cycle 12/10/2013  . Menorrhagia with irregular cycle 11/19/2013    Past Surgical History:  Procedure Laterality Date  . [place]   x 2   . DILITATION & CURRETTAGE/HYSTROSCOPY WITH THERMACHOICE ABLATION N/A 01/13/2014   Procedure: DILATATION & CURETTAGE/HYSTEROSCOPY WITH THERMACHOICE ABLATION;  Surgeon: [name], MD;  Location: [facility];  Service: Gynecology;  Laterality: N/A;  uteral sound is to 11cm,71ml of D5W in, 72ml out. temp:87 degree celcious total therapy time-28min, 57 sec  . TUBAL LIGATION      Family History  Problem Relation Age of Onset  . Hypertension Mother   . Heart attack Father   . Stroke Maternal Grandmother   . Heart attack Maternal Grandfather     Social History   Socioeconomic History  . Marital status: Divorced    Spouse  name: Not on file  . Number of children: 3  . Years of education: 10  . Highest education level: Some college, no degree  Occupational History  . Occupation: [place] [employer] [place]  . Financial resource strain: Not hard at all  . Food insecurity:    Worry: Never true    Inability: Never true  . Transportation needs:    Medical: No    Non-medical: No  Tobacco Use  . Smoking status: Current Every Day Smoker    Packs/day: 0.50    Years: 30.00    Pack years: 15.00    Types: Cigarettes  . Smokeless tobacco: Never Used  Substance and Sexual Activity  . Alcohol use: No  . Drug use: No  . Sexual activity: Yes    Birth control/protection: Surgical  Lifestyle  . Physical activity:    Days per week: 0 days    Minutes per session: 0 min  . Stress: Not at all  Relationships  . Social connections:    Talks on phone: More than three times a week    Gets together: Once a week    Attends religious service: Never    Active member of club or organization: No    Attends meetings of clubs or organizations: Never    Relationship status: Divorced  . Intimate partner violence:    Fear of current or ex partner: No  Emotionally abused: No    Physically abused: No    Forced sexual activity: No  Other Topics Concern  . Not on file  Social History Narrative  . Not on file     Current Outpatient Medications:  .  clonazePAM (KLONOPIN) 1 MG tablet, Take 1 tablet by mouth daily., Disp: , Rfl:  .  Naproxen 375 MG TBEC, Take one by mouth every 12 hours if needed; take with food; tylenol is okay per package directions, Disp: 60 each, Rfl: 0 .  rizatriptan (MAXALT-MLT) 10 MG disintegrating tablet, Take 1 tablet by mouth as needed., Disp: , Rfl: 6 .  SUMAtriptan (IMITREX) 50 MG tablet, Take 50 mg by mouth every 2 (two) hours as needed for migraine. , Disp: , Rfl:  .  topiramate (TOPAMAX) 100 MG tablet, Take 100 mg by mouth 4 (four) times daily as needed., Disp: , Rfl:   Allergies   Allergen Reactions  . Zoloft [Sertraline Hcl]     Unpleasant thoughts    I personally reviewed active problem list, medication list, allergies, family history with the patient/caregiver today.   ROS  Ten systems reviewed and is negative except as mentioned in HPI   Objective  Vitals:   07/04/18 0841  BP: 132/76  Pulse: 81  Resp: 16  Temp: 97.6 F (36.4 C)  TempSrc: Oral  SpO2: 98%  Weight: 209 lb 8 oz (95 kg)  Height: 5\' 6"  (1.676 m)    Body mass index is 33.81 kg/m.  Physical Exam  Constitutional: Patient appears well-developed and well-nourished. Obese  No distress.  HEENT: head atraumatic, normocephalic, pupils equal and reactive to light, neck supple, throat within normal limits Cardiovascular: Normal rate, regular rhythm and normal heart sounds.  No murmur heard. No BLE edema. Pulmonary/Chest: Effort normal and breath sounds normal. No respiratory distress. Abdominal: Soft.  There is no tenderness. Psychiatric: Patient has a normal mood and affect. behavior is normal. Judgment and thought content normal. Breast: hard lump more superficial but not attached to deeper tissues at 12 o'clock about 1 inch in size and about 1 in above areola. Inverted nipple no discharge on axillary lymphadenopathy, on the right side about quarter size, rubbery at 10 o'clock  PHQ2/9: Depression screen [facility] 2/9 04/15/2018 03/29/2018 02/07/2018 01/03/2018  Decreased Interest 0 0 0 0  Down, Depressed, Hopeless 0 0 0 0  PHQ - 2 Score 0 0 0 0  Altered sleeping 0 0 0 0  Tired, decreased energy 0 0 0 1  Change in appetite 0 0 0 0  Feeling bad or failure about yourself  0 0 0 0  Trouble concentrating 0 0 0 0  Suicidal thoughts 0 0 0 0  PHQ-9 Score 0 0 0 1  Difficult doing work/chores Not difficult at all Not difficult at all Not difficult at all Not difficult at all    phq 9 is negative   Fall Risk: Fall Risk  07/04/2018 04/15/2018 03/29/2018 02/07/2018 02/07/2018  Falls in the past year? 0  0 0 0 0  Number falls in past yr: 0 0 0 - -  Injury with Fall? 0 0 0 - -     Functional Status Survey: Is the patient deaf or have difficulty hearing?: No Does the patient have difficulty seeing, even when wearing glasses/contacts?: No Does the patient have difficulty concentrating, remembering, or making decisions?: No Does the patient have difficulty walking or climbing stairs?: No Does the patient have difficulty dressing or bathing?: No Does the  patient have difficulty doing errands alone such as visiting a doctor's office or shopping?: No    Assessment & Plan  1. Breast lump on left side at 12 o'clock position  - MM Digital Diagnostic Bilat; Future - US BREAST COMPLETE UNI LEFT INC AXILLA; Future  Answered questions, explained that we will place referral to surgeon after mammogram is done  2. Breast lump on right side at 10 o'clock position  - MM Digital Diagnostic Bilat; Future - US BREAST LTD UNI RIGHT INC AXILLA; Future

## 2018-07-17 ENCOUNTER — Other Ambulatory Visit: Payer: Self-pay

## 2018-07-17 ENCOUNTER — Ambulatory Visit
Admission: RE | Admit: 2018-07-17 | Discharge: 2018-07-17 | Disposition: A | Discharge status: Home / Self Care | Payer: Managed Care, Other (non HMO) | Source: Ambulatory Visit | Attending: Family Medicine | Admitting: Family Medicine

## 2018-07-17 DIAGNOSIS — N6325 Unspecified lump in the left breast, overlapping quadrants: Secondary | ICD-10-CM

## 2018-07-17 DIAGNOSIS — N6320 Unspecified lump in the left breast, unspecified quadrant: Secondary | ICD-10-CM | POA: Diagnosis present

## 2018-07-17 DIAGNOSIS — N6311 Unspecified lump in the right breast, upper outer quadrant: Secondary | ICD-10-CM | POA: Insufficient documentation

## 2018-07-17 IMAGING — MG DIGITAL DIAGNOSTIC BILATERAL MAMMOGRAM WITH TOMO AND CAD
6 of 15 series · 6 of 40 positions shown · non-contrast
Comparison: Previous exam(s).
COMPARISON: Previous exam(s).

Addendum:
CLINICAL DATA: Patient describes palpable abnormalities within each
breast. Associated pain within the upper LEFT breast.

EXAM:
DIGITAL DIAGNOSTIC BILATERAL MAMMOGRAM WITH CAD AND TOMO
ULTRASOUND BILATERAL BREAST

[R TAN synth-2D (1 of 2)]
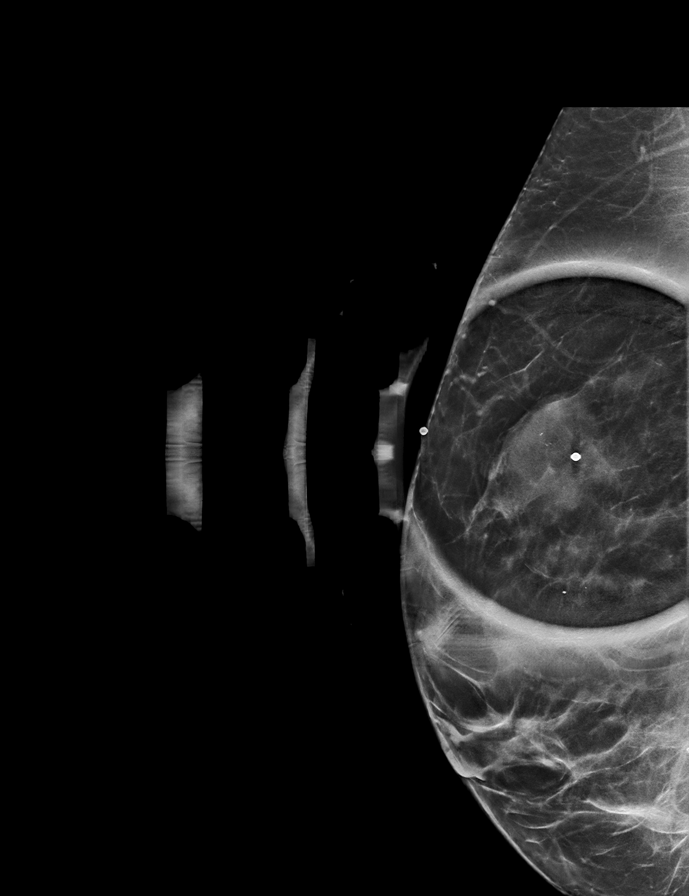

[R TAN synth-2D (2 of 2)]
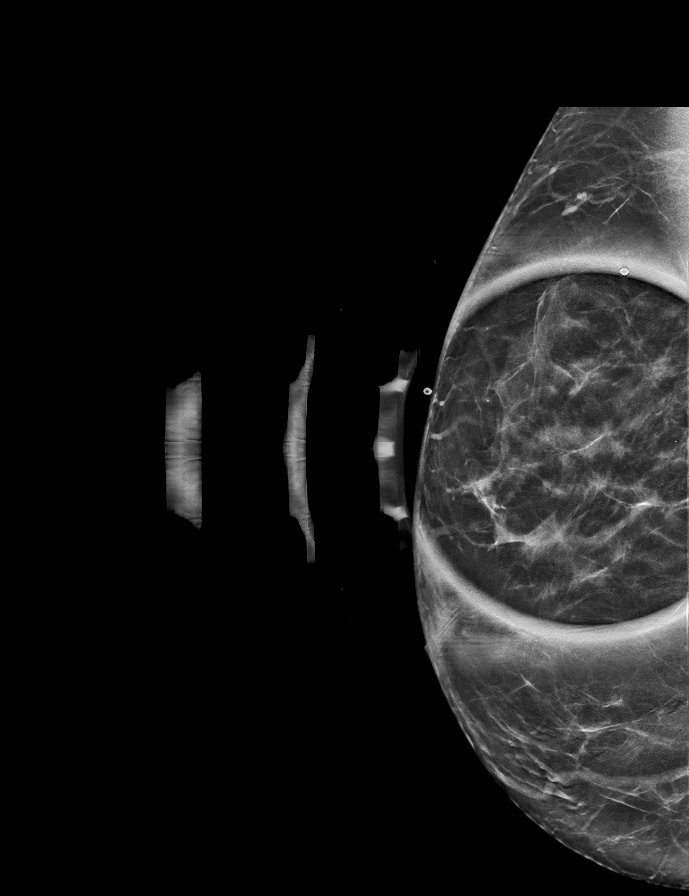

[L TAN synth-2D]
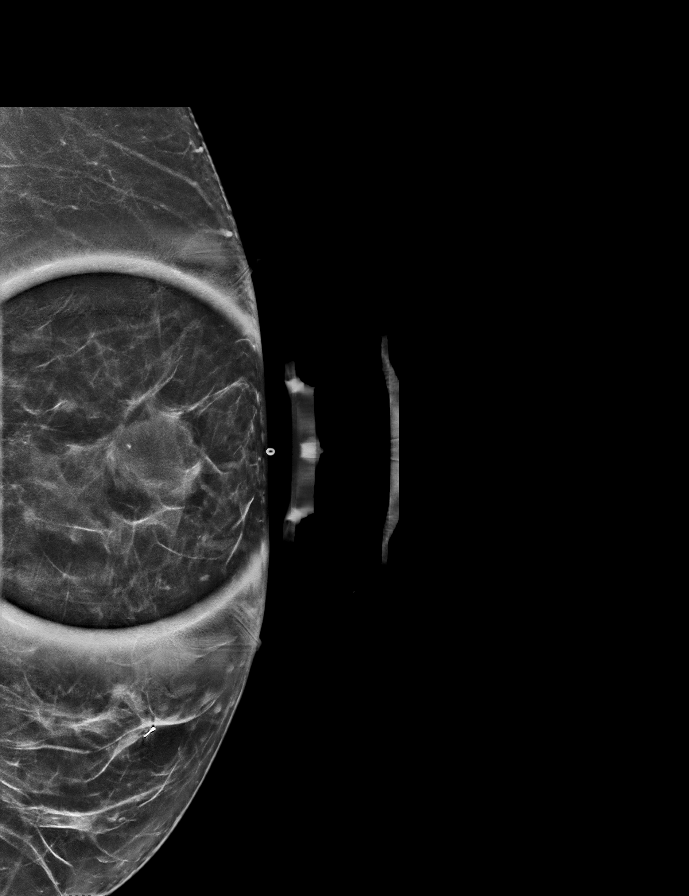

[R MLO synth-2D]
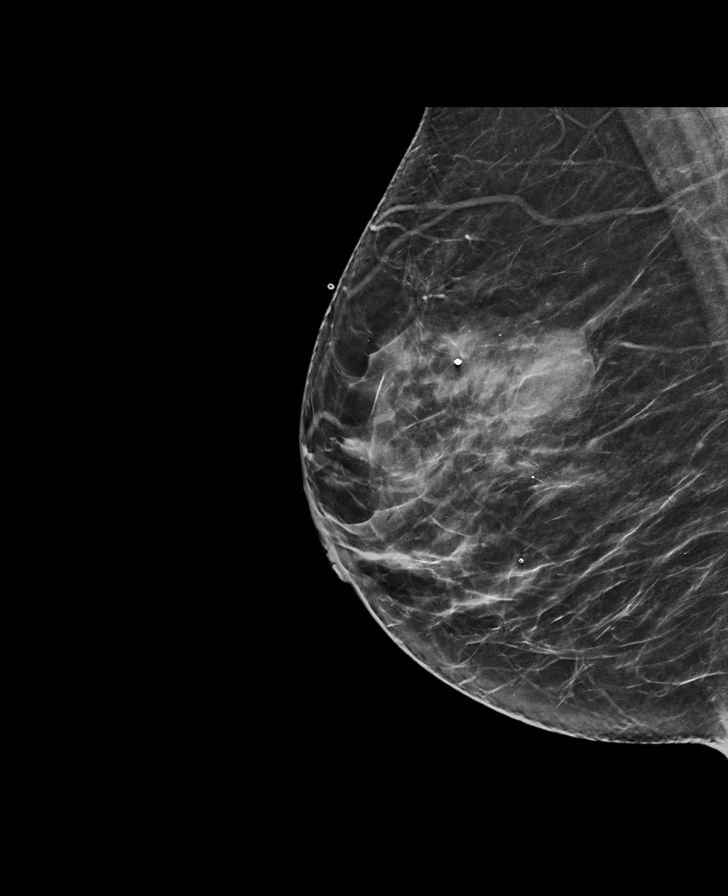

[R CC synth-2D]
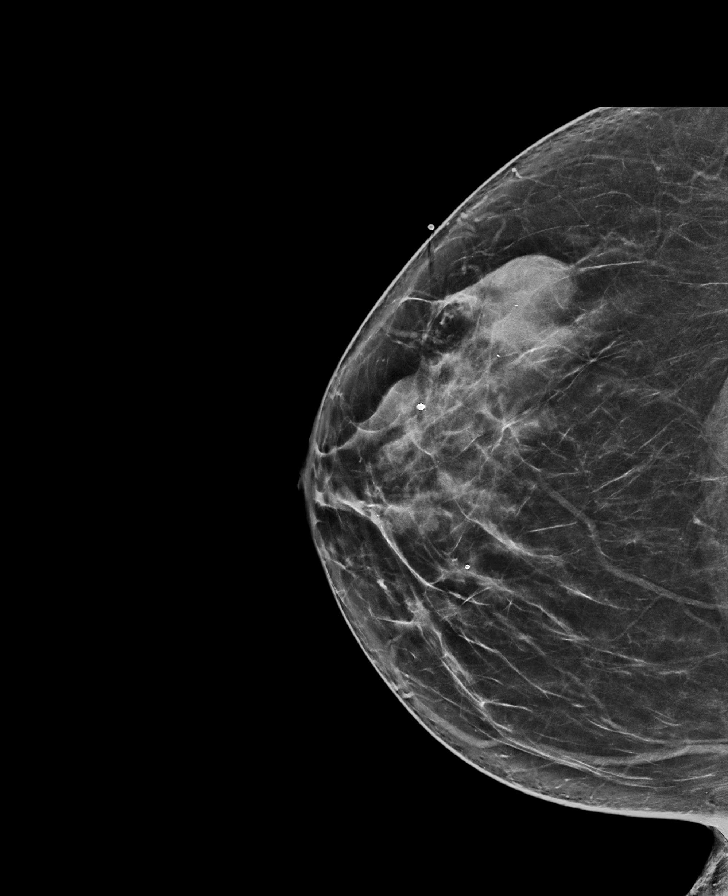

[L MLO synth-2D]
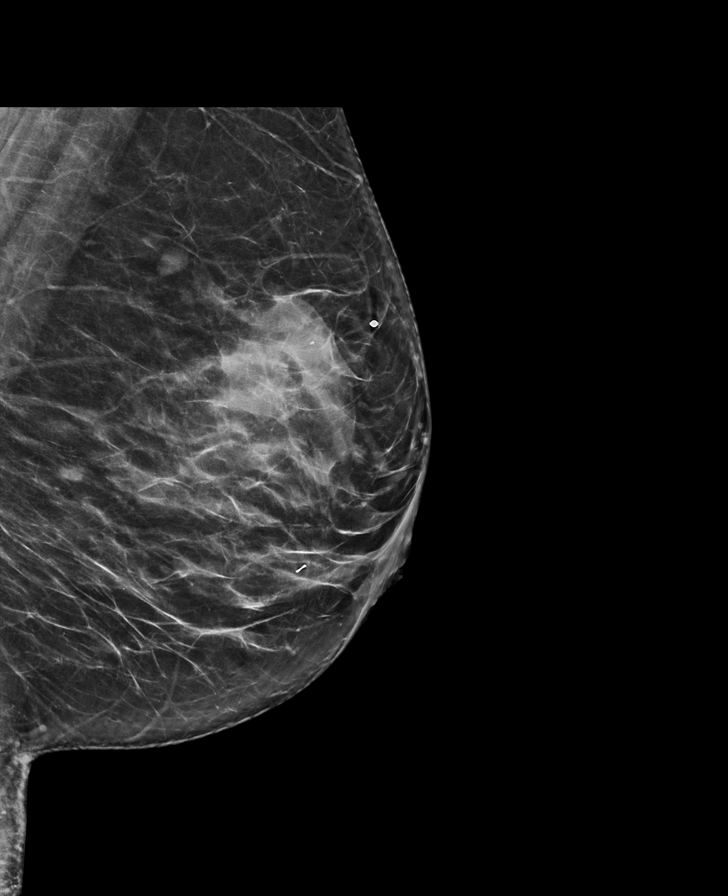

[6 of 40 positions shown; findings below may reference images not displayed]

ACR Breast Density Category c: The breast tissue is heterogeneously
dense, which may obscure small masses.
FINDINGS: RIGHT breast: There is a partially obscured mass within the upper
outer quadrant of the RIGHT breast, measuring approximately 3 cm
greatest dimension, corresponding to the area of clinical concern
with overlying skin marker in place.

There are no new dominant masses, suspicious calcifications or
secondary signs of malignancy identified elsewhere in the RIGHT
breast.

LEFT breast: There is a partially obscured mass within the upper
LEFT breast, measuring approximately 2 cm greatest dimension,
corresponding to the area of clinical concern with overlying skin
marker in place.

There are no new dominant masses, suspicious calcifications or
secondary signs of malignancy identified elsewhere in the LEFT
breast.

Mammographic images were processed with CAD.

RIGHT breast: Targeted ultrasound is performed, showing there are
simple cysts in the RIGHT breast at the 9:30 o'clock axis and 11
o'clock axis, measuring 3.2 cm and 1.7 cm respectively,
corresponding to the area of clinical concern and mammographic
finding. No suspicious solid or cystic mass.

LEFT breast: Targeted ultrasound is performed, showing a simple cyst
in the LEFT breast at the 2 o'clock axis, 5 cm from nipple,
measuring 2.1 cm, corresponding to the mammographic finding.

There is an additional minimally complicated cyst in the LEFT breast
at the 12 o'clock axis, 5 cm from nipple, measuring 2.3 x 1.5 x
cm, with thin internal septations and with questionable vascularity
within a portion of the septation (versus a crossing blood vessel).
This particular cyst corresponds to the site of patient's breast
pain.
IMPRESSION: 1. Probably benign minimally complicated cyst in the LEFT breast at
the 12 o'clock axis, 5 cm from the nipple, measuring 2.3 x 1.5 x
cm, with thin internal septations and with questionable vascularity
within a peripheral portion of the septation (more likely a crossing
blood vessel). Patient requests therapeutic cyst aspiration due to
associated pain. Given the question of a septation with vascularity,
recommend that the aspirate be sent for cytology to ensure
benignity.
2. Additional benign cysts within each breast, as detailed above.
3. No evidence of malignancy within the RIGHT breast.

RECOMMENDATION:
Aspiration of the LEFT breast cyst at the 12 o'clock axis, 5 cm from
the nipple, with aspirate sent for cytology to ensure benignity.

I have discussed the findings and recommendations with the patient.
Results were also provided in writing at the conclusion of the
visit. If applicable, a reminder letter will be sent to the patient
regarding the next appointment.

BI-RADS CATEGORY  3: Probably benign.

ADDENDUM:
The patient returned on [DATE] for ultrasound-guided procedure
in the left breast. Upon questioning, the patient states that the
palpable lump in her breast and associated tenderness has
significantly improved in the interim, with no current symptoms
remaining. Ultrasound evaluation of this area demonstrates marked
reduction in the size of the previously noted cyst at the 12 o'clock
position 5 cm from the nipple. It currently measures 8 x 7 x 5 mm
(previously 2.3 x 1.8 x 1.5 cm). A thin thin internal septation is
again noted. There is peripheral but no internal vascularity. This
is consistent with a benign cyst. Therefore the scheduled procedure
was canceled. Recommendation is for the patient to return to annual
screening mammography in [DATE].

BI-RADS category 2: Benign findings.

*** End of Addendum ***
ACR Breast Density Category c: The breast tissue is heterogeneously
dense, which may obscure small masses.
FINDINGS: RIGHT breast: There is a partially obscured mass within the upper
outer quadrant of the RIGHT breast, measuring approximately 3 cm
greatest dimension, corresponding to the area of clinical concern
with overlying skin marker in place.

There are no new dominant masses, suspicious calcifications or
secondary signs of malignancy identified elsewhere in the RIGHT
breast.

LEFT breast: There is a partially obscured mass within the upper
LEFT breast, measuring approximately 2 cm greatest dimension,
corresponding to the area of clinical concern with overlying skin
marker in place.

There are no new dominant masses, suspicious calcifications or
secondary signs of malignancy identified elsewhere in the LEFT
breast.

Mammographic images were processed with CAD.

RIGHT breast: Targeted ultrasound is performed, showing there are
simple cysts in the RIGHT breast at the 9:30 o'clock axis and 11
o'clock axis, measuring 3.2 cm and 1.7 cm respectively,
corresponding to the area of clinical concern and mammographic
finding. No suspicious solid or cystic mass.

LEFT breast: Targeted ultrasound is performed, showing a simple cyst
in the LEFT breast at the 2 o'clock axis, 5 cm from nipple,
measuring 2.1 cm, corresponding to the mammographic finding.

There is an additional minimally complicated cyst in the LEFT breast
at the 12 o'clock axis, 5 cm from nipple, measuring 2.3 x 1.5 x
cm, with thin internal septations and with questionable vascularity
within a portion of the septation (versus a crossing blood vessel).
This particular cyst corresponds to the site of patient's breast
pain.
IMPRESSION: 1. Probably benign minimally complicated cyst in the LEFT breast at
the 12 o'clock axis, 5 cm from the nipple, measuring 2.3 x 1.5 x
cm, with thin internal septations and with questionable vascularity
within a peripheral portion of the septation (more likely a crossing
blood vessel). Patient requests therapeutic cyst aspiration due to
associated pain. Given the question of a septation with vascularity,
recommend that the aspirate be sent for cytology to ensure
benignity.
2. Additional benign cysts within each breast, as detailed above.
3. No evidence of malignancy within the RIGHT breast.

RECOMMENDATION:
Aspiration of the LEFT breast cyst at the 12 o'clock axis, 5 cm from
the nipple, with aspirate sent for cytology to ensure benignity.

I have discussed the findings and recommendations with the patient.
Results were also provided in writing at the conclusion of the
visit. If applicable, a reminder letter will be sent to the patient
regarding the next appointment.

BI-RADS CATEGORY  3: Probably benign.

## 2018-07-17 IMAGING — US ULTRASOUND RIGHT BREAST LIMITED
1 series · 8 of 8 positions shown · non-contrast
Comparison: Previous exam(s).
COMPARISON: Previous exam(s).

Addendum:
CLINICAL DATA: Patient describes palpable abnormalities within each
breast. Associated pain within the upper LEFT breast.

EXAM:
DIGITAL DIAGNOSTIC BILATERAL MAMMOGRAM WITH CAD AND TOMO
ULTRASOUND BILATERAL BREAST

[Series 1: ultrasound right breast limited · 0.06mm/px · 8 of 8 slices shown]
[im 1/8]
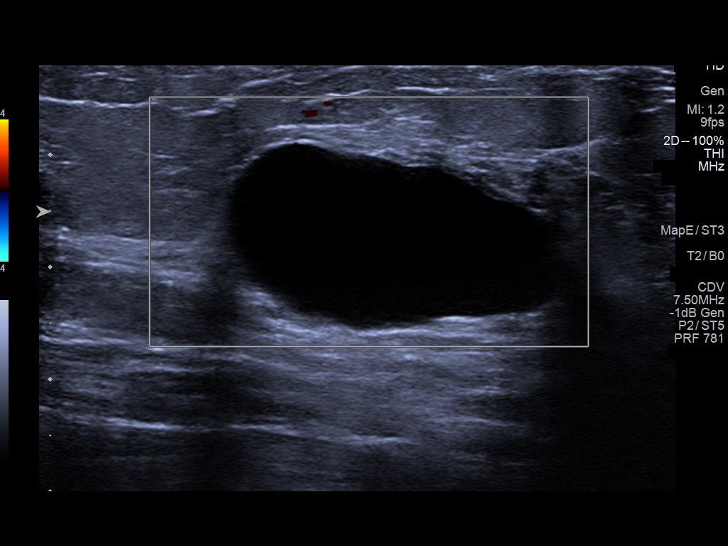
[im 2/8]
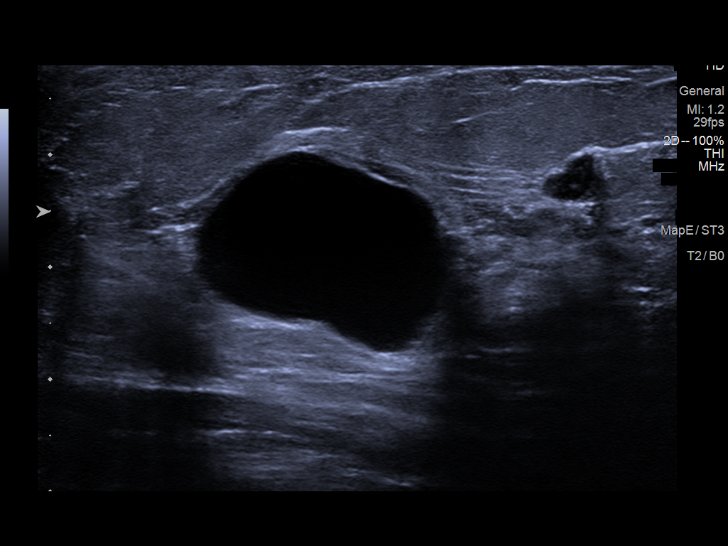
[im 3/8]
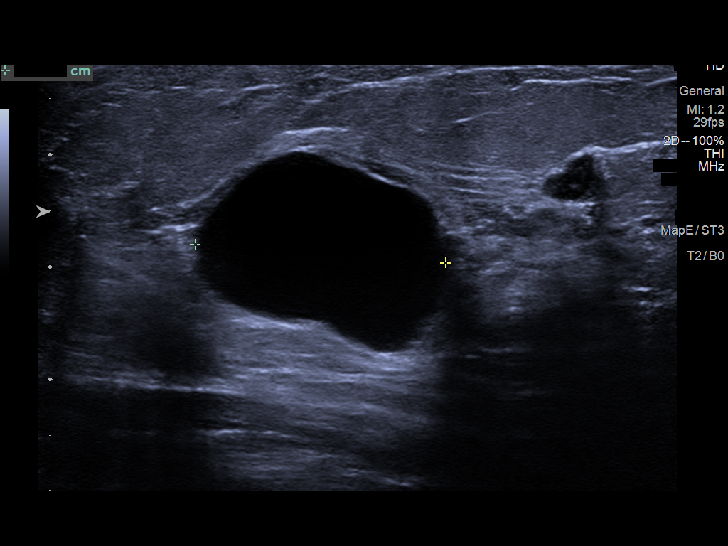
[im 4/8]
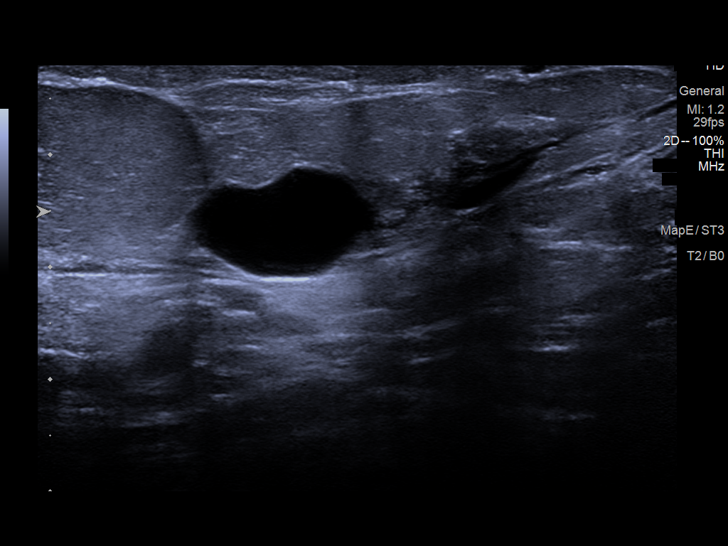
[im 5/8]
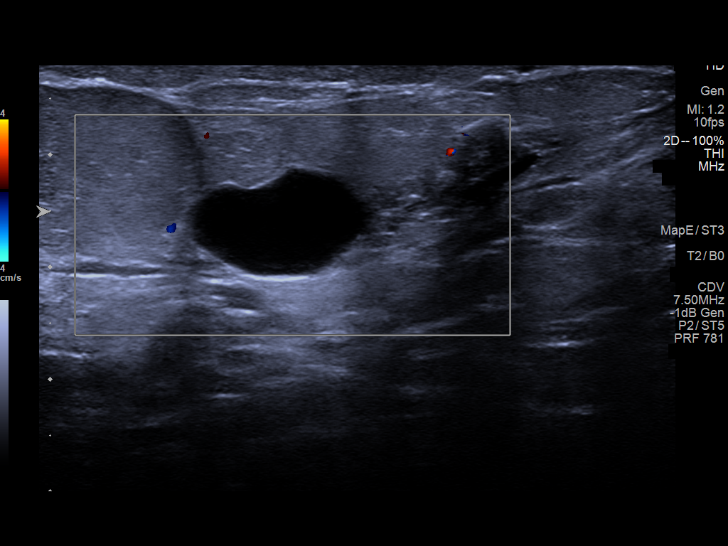
[im 6/8]
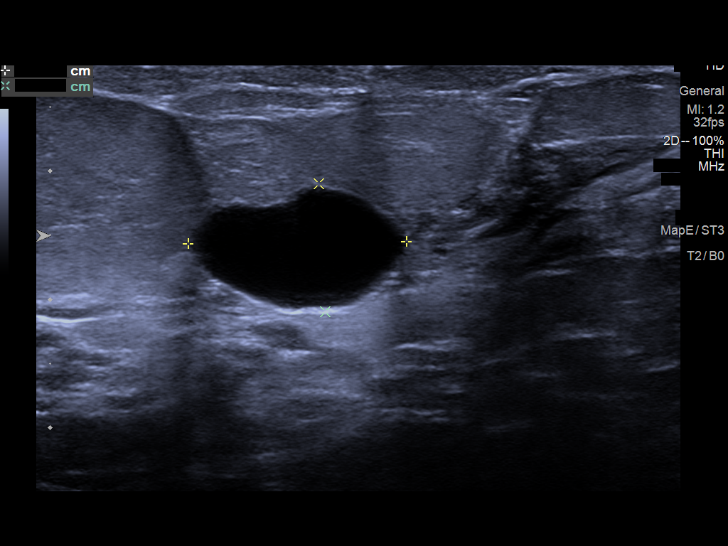
[im 7/8]
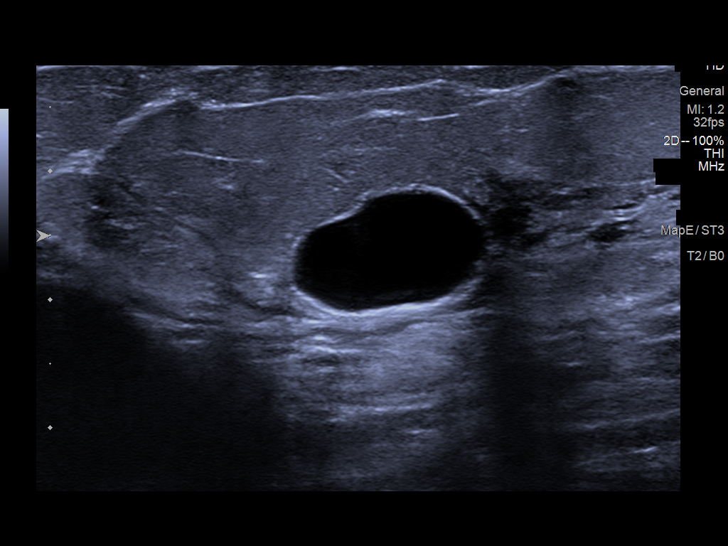
[im 8/8]
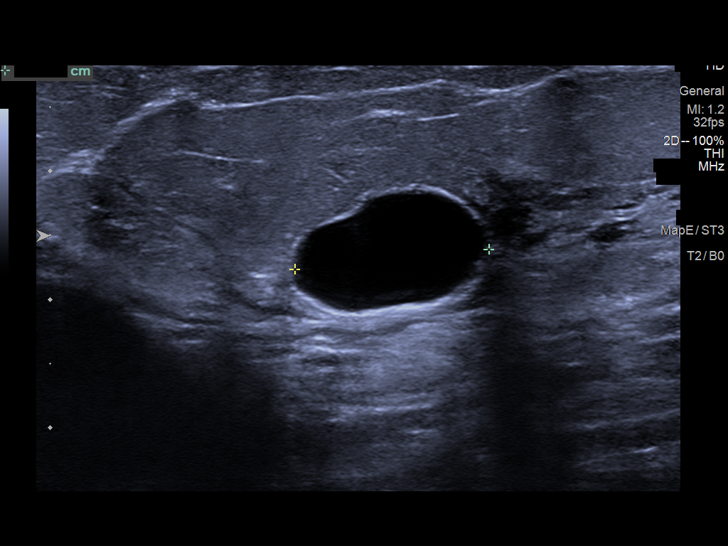

[8 of 8 positions shown; findings below may reference images not displayed]

ACR Breast Density Category c: The breast tissue is heterogeneously
dense, which may obscure small masses.
FINDINGS: RIGHT breast: There is a partially obscured mass within the upper
outer quadrant of the RIGHT breast, measuring approximately 3 cm
greatest dimension, corresponding to the area of clinical concern
with overlying skin marker in place.

There are no new dominant masses, suspicious calcifications or
secondary signs of malignancy identified elsewhere in the RIGHT
breast.

LEFT breast: There is a partially obscured mass within the upper
LEFT breast, measuring approximately 2 cm greatest dimension,
corresponding to the area of clinical concern with overlying skin
marker in place.

There are no new dominant masses, suspicious calcifications or
secondary signs of malignancy identified elsewhere in the LEFT
breast.

Mammographic images were processed with CAD.

RIGHT breast: Targeted ultrasound is performed, showing there are
simple cysts in the RIGHT breast at the 9:30 o'clock axis and 11
o'clock axis, measuring 3.2 cm and 1.7 cm respectively,
corresponding to the area of clinical concern and mammographic
finding. No suspicious solid or cystic mass.

LEFT breast: Targeted ultrasound is performed, showing a simple cyst
in the LEFT breast at the 2 o'clock axis, 5 cm from nipple,
measuring 2.1 cm, corresponding to the mammographic finding.

There is an additional minimally complicated cyst in the LEFT breast
at the 12 o'clock axis, 5 cm from nipple, measuring 2.3 x 1.5 x
cm, with thin internal septations and with questionable vascularity
within a portion of the septation (versus a crossing blood vessel).
This particular cyst corresponds to the site of patient's breast
pain.
IMPRESSION: 1. Probably benign minimally complicated cyst in the LEFT breast at
the 12 o'clock axis, 5 cm from the nipple, measuring 2.3 x 1.5 x
cm, with thin internal septations and with questionable vascularity
within a peripheral portion of the septation (more likely a crossing
blood vessel). Patient requests therapeutic cyst aspiration due to
associated pain. Given the question of a septation with vascularity,
recommend that the aspirate be sent for cytology to ensure
benignity.
2. Additional benign cysts within each breast, as detailed above.
3. No evidence of malignancy within the RIGHT breast.

RECOMMENDATION:
Aspiration of the LEFT breast cyst at the 12 o'clock axis, 5 cm from
the nipple, with aspirate sent for cytology to ensure benignity.

I have discussed the findings and recommendations with the patient.
Results were also provided in writing at the conclusion of the
visit. If applicable, a reminder letter will be sent to the patient
regarding the next appointment.

BI-RADS CATEGORY  3: Probably benign.

ADDENDUM:
The patient returned on [DATE] for ultrasound-guided procedure
in the left breast. Upon questioning, the patient states that the
palpable lump in her breast and associated tenderness has
significantly improved in the interim, with no current symptoms
remaining. Ultrasound evaluation of this area demonstrates marked
reduction in the size of the previously noted cyst at the 12 o'clock
position 5 cm from the nipple. It currently measures 8 x 7 x 5 mm
(previously 2.3 x 1.8 x 1.5 cm). A thin thin internal septation is
again noted. There is peripheral but no internal vascularity. This
is consistent with a benign cyst. Therefore the scheduled procedure
was canceled. Recommendation is for the patient to return to annual
screening mammography in [DATE].

BI-RADS category 2: Benign findings.

*** End of Addendum ***
ACR Breast Density Category c: The breast tissue is heterogeneously
dense, which may obscure small masses.
FINDINGS: RIGHT breast: There is a partially obscured mass within the upper
outer quadrant of the RIGHT breast, measuring approximately 3 cm
greatest dimension, corresponding to the area of clinical concern
with overlying skin marker in place.

There are no new dominant masses, suspicious calcifications or
secondary signs of malignancy identified elsewhere in the RIGHT
breast.

LEFT breast: There is a partially obscured mass within the upper
LEFT breast, measuring approximately 2 cm greatest dimension,
corresponding to the area of clinical concern with overlying skin
marker in place.

There are no new dominant masses, suspicious calcifications or
secondary signs of malignancy identified elsewhere in the LEFT
breast.

Mammographic images were processed with CAD.

RIGHT breast: Targeted ultrasound is performed, showing there are
simple cysts in the RIGHT breast at the 9:30 o'clock axis and 11
o'clock axis, measuring 3.2 cm and 1.7 cm respectively,
corresponding to the area of clinical concern and mammographic
finding. No suspicious solid or cystic mass.

LEFT breast: Targeted ultrasound is performed, showing a simple cyst
in the LEFT breast at the 2 o'clock axis, 5 cm from nipple,
measuring 2.1 cm, corresponding to the mammographic finding.

There is an additional minimally complicated cyst in the LEFT breast
at the 12 o'clock axis, 5 cm from nipple, measuring 2.3 x 1.5 x
cm, with thin internal septations and with questionable vascularity
within a portion of the septation (versus a crossing blood vessel).
This particular cyst corresponds to the site of patient's breast
pain.
IMPRESSION: 1. Probably benign minimally complicated cyst in the LEFT breast at
the 12 o'clock axis, 5 cm from the nipple, measuring 2.3 x 1.5 x
cm, with thin internal septations and with questionable vascularity
within a peripheral portion of the septation (more likely a crossing
blood vessel). Patient requests therapeutic cyst aspiration due to
associated pain. Given the question of a septation with vascularity,
recommend that the aspirate be sent for cytology to ensure
benignity.
2. Additional benign cysts within each breast, as detailed above.
3. No evidence of malignancy within the RIGHT breast.

RECOMMENDATION:
Aspiration of the LEFT breast cyst at the 12 o'clock axis, 5 cm from
the nipple, with aspirate sent for cytology to ensure benignity.

I have discussed the findings and recommendations with the patient.
Results were also provided in writing at the conclusion of the
visit. If applicable, a reminder letter will be sent to the patient
regarding the next appointment.

BI-RADS CATEGORY  3: Probably benign.

## 2018-07-17 IMAGING — US ULTRASOUND LEFT BREAST LIMITED
2 of 3 series · 14 of 22 positions shown · non-contrast
Comparison: Previous exam(s).
COMPARISON: Previous exam(s).

Addendum:
CLINICAL DATA: Patient describes palpable abnormalities within each
breast. Associated pain within the upper LEFT breast.

EXAM:
DIGITAL DIAGNOSTIC BILATERAL MAMMOGRAM WITH CAD AND TOMO
ULTRASOUND BILATERAL BREAST

[Series 1: ultrasound left breast limited · 0.07mm/px · 8 of 12 slices shown (1 of 2)]
[im 1/12]
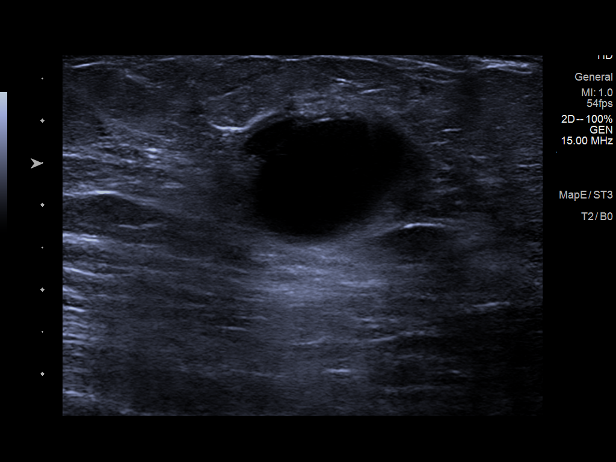
[im 3/12]
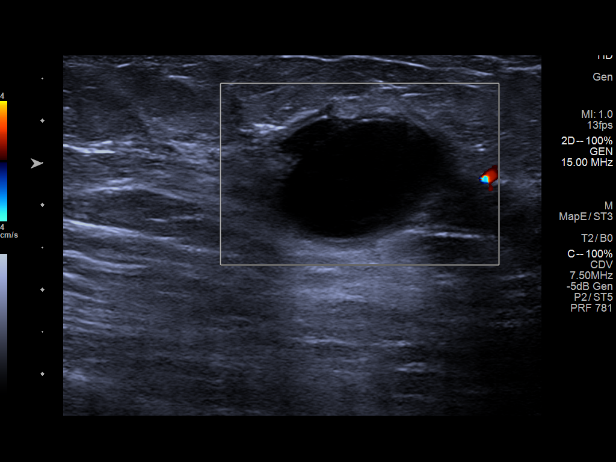
[im 4/12]
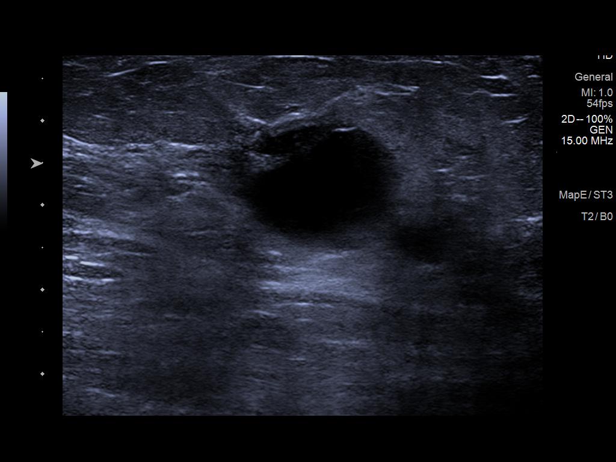
[im 6/12]
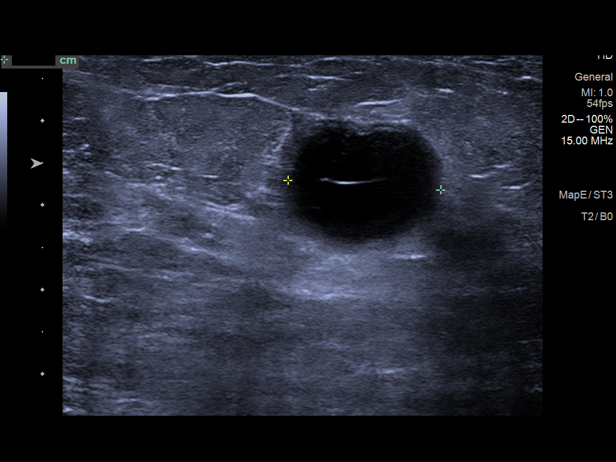
[im 8/12]
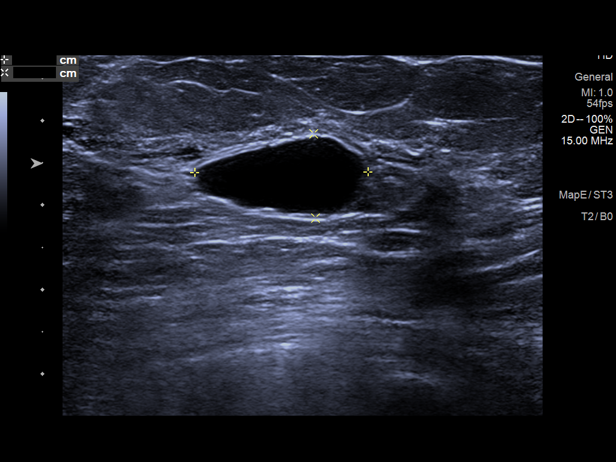
[im 9/12]
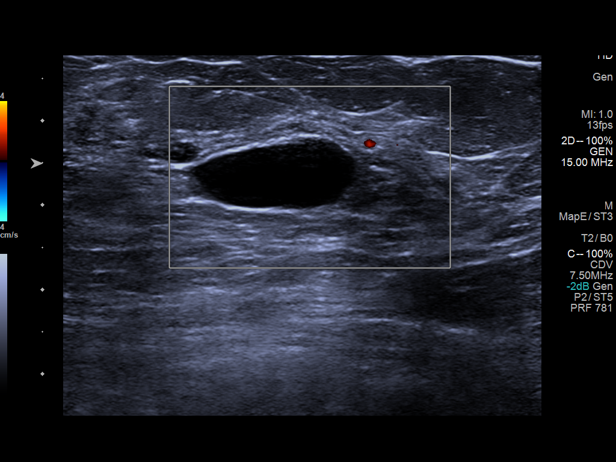
[im 11/12]
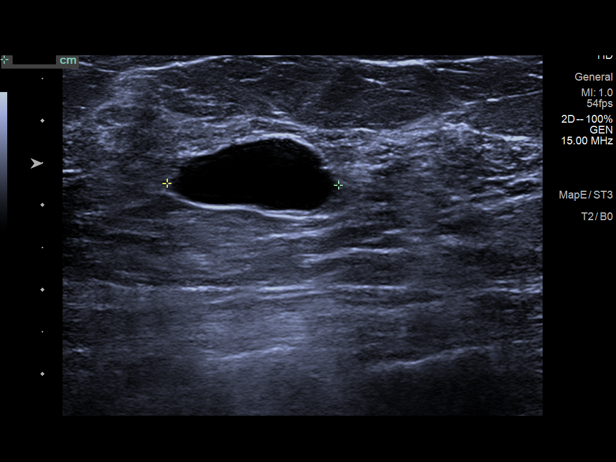
[im 12/12]
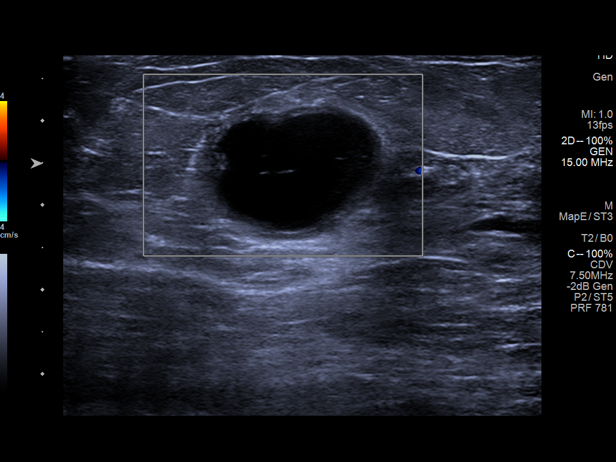

[Series 3: ultrasound left breast limited · 0.06mm/px · 6 of 9 slices shown (2 of 2)]
[im 1/9]
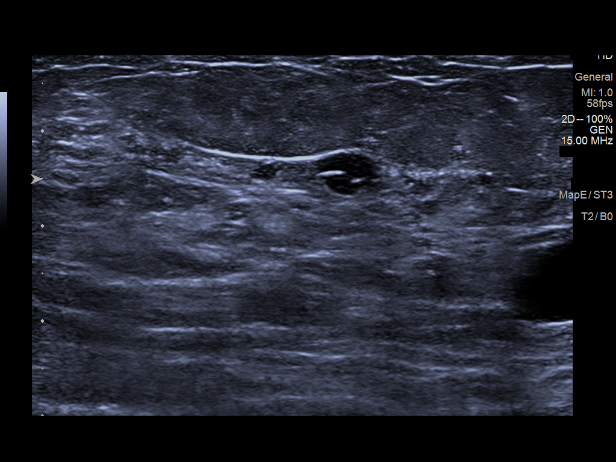
[im 2/9]
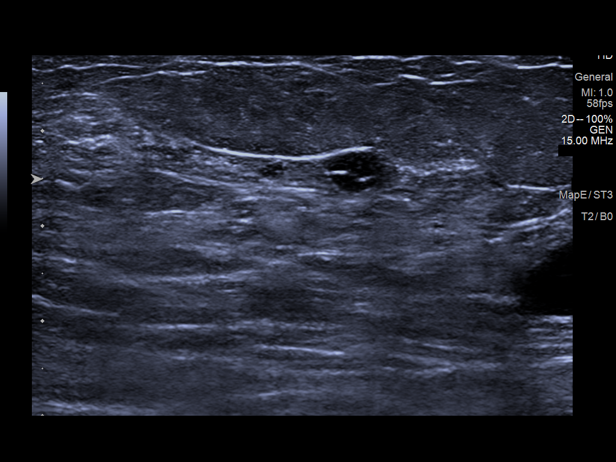
[im 4/9]
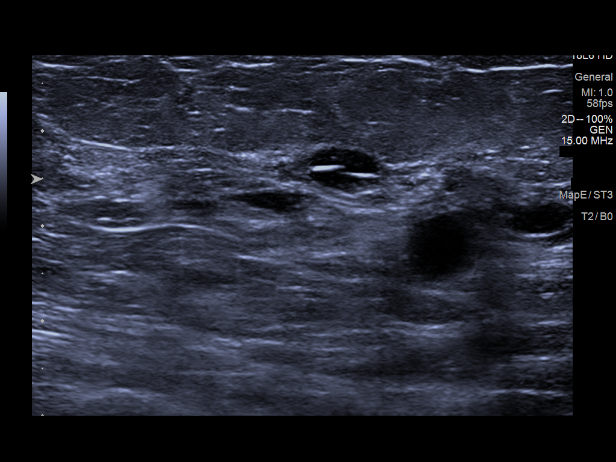
[im 6/9]
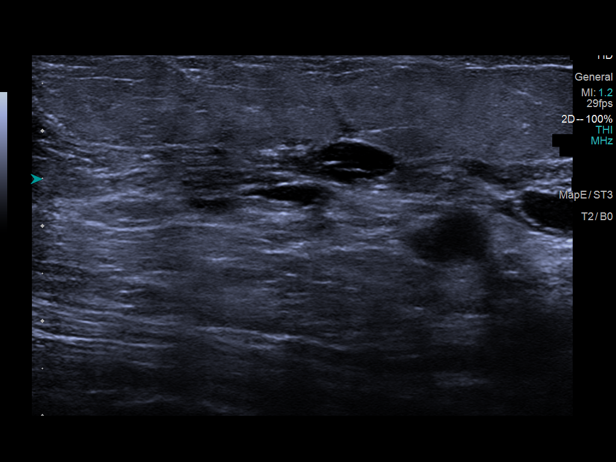
[im 7/9]
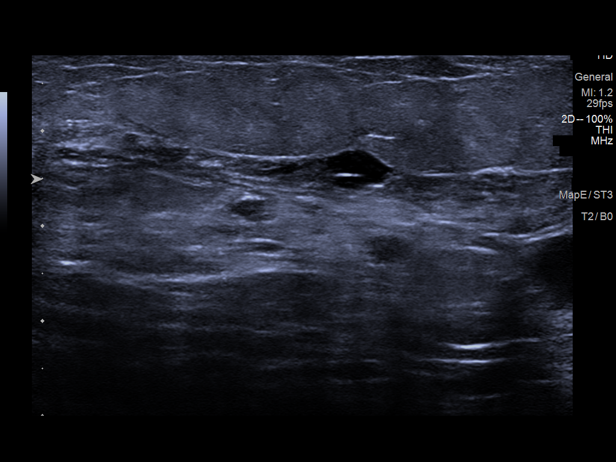
[im 9/9]
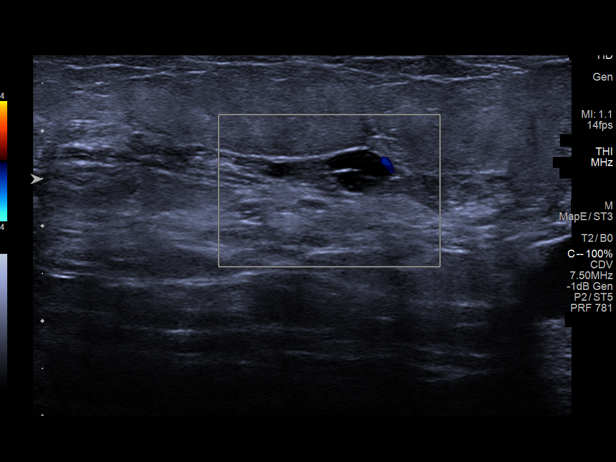

[14 of 22 positions shown; findings below may reference images not displayed]

ACR Breast Density Category c: The breast tissue is heterogeneously
dense, which may obscure small masses.
FINDINGS: RIGHT breast: There is a partially obscured mass within the upper
outer quadrant of the RIGHT breast, measuring approximately 3 cm
greatest dimension, corresponding to the area of clinical concern
with overlying skin marker in place.

There are no new dominant masses, suspicious calcifications or
secondary signs of malignancy identified elsewhere in the RIGHT
breast.

LEFT breast: There is a partially obscured mass within the upper
LEFT breast, measuring approximately 2 cm greatest dimension,
corresponding to the area of clinical concern with overlying skin
marker in place.

There are no new dominant masses, suspicious calcifications or
secondary signs of malignancy identified elsewhere in the LEFT
breast.

Mammographic images were processed with CAD.

RIGHT breast: Targeted ultrasound is performed, showing there are
simple cysts in the RIGHT breast at the 9:30 o'clock axis and 11
o'clock axis, measuring 3.2 cm and 1.7 cm respectively,
corresponding to the area of clinical concern and mammographic
finding. No suspicious solid or cystic mass.

LEFT breast: Targeted ultrasound is performed, showing a simple cyst
in the LEFT breast at the 2 o'clock axis, 5 cm from nipple,
measuring 2.1 cm, corresponding to the mammographic finding.

There is an additional minimally complicated cyst in the LEFT breast
at the 12 o'clock axis, 5 cm from nipple, measuring 2.3 x 1.5 x
cm, with thin internal septations and with questionable vascularity
within a portion of the septation (versus a crossing blood vessel).
This particular cyst corresponds to the site of patient's breast
pain.
IMPRESSION: 1. Probably benign minimally complicated cyst in the LEFT breast at
the 12 o'clock axis, 5 cm from the nipple, measuring 2.3 x 1.5 x
cm, with thin internal septations and with questionable vascularity
within a peripheral portion of the septation (more likely a crossing
blood vessel). Patient requests therapeutic cyst aspiration due to
associated pain. Given the question of a septation with vascularity,
recommend that the aspirate be sent for cytology to ensure
benignity.
2. Additional benign cysts within each breast, as detailed above.
3. No evidence of malignancy within the RIGHT breast.

RECOMMENDATION:
Aspiration of the LEFT breast cyst at the 12 o'clock axis, 5 cm from
the nipple, with aspirate sent for cytology to ensure benignity.

I have discussed the findings and recommendations with the patient.
Results were also provided in writing at the conclusion of the
visit. If applicable, a reminder letter will be sent to the patient
regarding the next appointment.

BI-RADS CATEGORY  3: Probably benign.

ADDENDUM:
The patient returned on [DATE] for ultrasound-guided procedure
in the left breast. Upon questioning, the patient states that the
palpable lump in her breast and associated tenderness has
significantly improved in the interim, with no current symptoms
remaining. Ultrasound evaluation of this area demonstrates marked
reduction in the size of the previously noted cyst at the 12 o'clock
position 5 cm from the nipple. It currently measures 8 x 7 x 5 mm
(previously 2.3 x 1.8 x 1.5 cm). A thin thin internal septation is
again noted. There is peripheral but no internal vascularity. This
is consistent with a benign cyst. Therefore the scheduled procedure
was canceled. Recommendation is for the patient to return to annual
screening mammography in [DATE].

BI-RADS category 2: Benign findings.

*** End of Addendum ***
ACR Breast Density Category c: The breast tissue is heterogeneously
dense, which may obscure small masses.
FINDINGS: RIGHT breast: There is a partially obscured mass within the upper
outer quadrant of the RIGHT breast, measuring approximately 3 cm
greatest dimension, corresponding to the area of clinical concern
with overlying skin marker in place.

There are no new dominant masses, suspicious calcifications or
secondary signs of malignancy identified elsewhere in the RIGHT
breast.

LEFT breast: There is a partially obscured mass within the upper
LEFT breast, measuring approximately 2 cm greatest dimension,
corresponding to the area of clinical concern with overlying skin
marker in place.

There are no new dominant masses, suspicious calcifications or
secondary signs of malignancy identified elsewhere in the LEFT
breast.

Mammographic images were processed with CAD.

RIGHT breast: Targeted ultrasound is performed, showing there are
simple cysts in the RIGHT breast at the 9:30 o'clock axis and 11
o'clock axis, measuring 3.2 cm and 1.7 cm respectively,
corresponding to the area of clinical concern and mammographic
finding. No suspicious solid or cystic mass.

LEFT breast: Targeted ultrasound is performed, showing a simple cyst
in the LEFT breast at the 2 o'clock axis, 5 cm from nipple,
measuring 2.1 cm, corresponding to the mammographic finding.

There is an additional minimally complicated cyst in the LEFT breast
at the 12 o'clock axis, 5 cm from nipple, measuring 2.3 x 1.5 x
cm, with thin internal septations and with questionable vascularity
within a portion of the septation (versus a crossing blood vessel).
This particular cyst corresponds to the site of patient's breast
pain.
IMPRESSION: 1. Probably benign minimally complicated cyst in the LEFT breast at
the 12 o'clock axis, 5 cm from the nipple, measuring 2.3 x 1.5 x
cm, with thin internal septations and with questionable vascularity
within a peripheral portion of the septation (more likely a crossing
blood vessel). Patient requests therapeutic cyst aspiration due to
associated pain. Given the question of a septation with vascularity,
recommend that the aspirate be sent for cytology to ensure
benignity.
2. Additional benign cysts within each breast, as detailed above.
3. No evidence of malignancy within the RIGHT breast.

RECOMMENDATION:
Aspiration of the LEFT breast cyst at the 12 o'clock axis, 5 cm from
the nipple, with aspirate sent for cytology to ensure benignity.

I have discussed the findings and recommendations with the patient.
Results were also provided in writing at the conclusion of the
visit. If applicable, a reminder letter will be sent to the patient
regarding the next appointment.

BI-RADS CATEGORY  3: Probably benign.

## 2018-07-24 ENCOUNTER — Other Ambulatory Visit: Payer: Self-pay | Admitting: Family Medicine

## 2018-07-24 DIAGNOSIS — N6002 Solitary cyst of left breast: Secondary | ICD-10-CM

## 2018-07-24 DIAGNOSIS — R928 Other abnormal and inconclusive findings on diagnostic imaging of breast: Secondary | ICD-10-CM

## 2018-08-13 ENCOUNTER — Ambulatory Visit
Admission: RE | Admit: 2018-08-13 | Discharge: 2018-08-13 | Disposition: A | Discharge status: Home / Self Care | Payer: BLUE CROSS/BLUE SHIELD | Source: Ambulatory Visit | Attending: Family Medicine | Admitting: Family Medicine

## 2018-08-13 ENCOUNTER — Other Ambulatory Visit: Payer: Self-pay

## 2018-08-13 DIAGNOSIS — N6002 Solitary cyst of left breast: Secondary | ICD-10-CM | POA: Insufficient documentation

## 2018-08-13 DIAGNOSIS — R928 Other abnormal and inconclusive findings on diagnostic imaging of breast: Secondary | ICD-10-CM | POA: Insufficient documentation

## 2018-10-11 ENCOUNTER — Ambulatory Visit (INDEPENDENT_AMBULATORY_CARE_PROVIDER_SITE_OTHER): Payer: Managed Care, Other (non HMO) | Admitting: Family Medicine

## 2018-10-11 ENCOUNTER — Other Ambulatory Visit (HOSPITAL_COMMUNITY)
Admission: RE | Admit: 2018-10-11 | Discharge: 2018-10-11 | Disposition: A | Discharge status: Home / Self Care | Payer: 59 | Source: Ambulatory Visit | Attending: Family Medicine | Admitting: Family Medicine

## 2018-10-11 ENCOUNTER — Encounter: Payer: Self-pay | Admitting: Family Medicine

## 2018-10-11 ENCOUNTER — Other Ambulatory Visit: Payer: Self-pay

## 2018-10-11 VITALS — BP 128/70 | HR 76 | Temp 97.8°F | Resp 14 | Ht 66.0 in | Wt 224.5 lb

## 2018-10-11 DIAGNOSIS — Z13220 Encounter for screening for lipoid disorders: Secondary | ICD-10-CM

## 2018-10-11 DIAGNOSIS — M79671 Pain in right foot: Secondary | ICD-10-CM

## 2018-10-11 DIAGNOSIS — N898 Other specified noninflammatory disorders of vagina: Secondary | ICD-10-CM

## 2018-10-11 DIAGNOSIS — R600 Localized edema: Secondary | ICD-10-CM

## 2018-10-11 NOTE — Patient Instructions (Signed)
DASH Eating Plan DASH stands for "Dietary Approaches to Stop Hypertension." The DASH eating plan is a healthy eating plan that has been shown to reduce high blood pressure (hypertension). It may also reduce your risk for type 2 diabetes, heart disease, and stroke. The DASH eating plan may also help with weight loss. What are tips for following this plan?  General guidelines  Avoid eating more than 2,300 mg (milligrams) of salt (sodium) a day. If you have hypertension, you may need to reduce your sodium intake to 1,500 mg a day.  Limit alcohol intake to no more than 1 drink a day for nonpregnant women and 2 drinks a day for men. One drink equals 12 oz of beer, 5 oz of wine, or 1 oz of hard liquor.  Work with your health care provider to maintain a healthy body weight or to lose weight. Ask what an ideal weight is for you.  Get at least 30 minutes of exercise that causes your heart to beat faster (aerobic exercise) most days of the week. Activities may include walking, swimming, or biking.  Work with your health care provider or diet and nutrition specialist (dietitian) to adjust your eating plan to your individual calorie needs. Reading food labels   Check food labels for the amount of sodium per serving. Choose foods with less than 5 percent of the Daily Value of sodium. Generally, foods with less than 300 mg of sodium per serving fit into this eating plan.  To find whole grains, look for the word "whole" as the first word in the ingredient list. Shopping  Buy products labeled as "low-sodium" or "no salt added."  Buy fresh foods. Avoid canned foods and premade or frozen meals. Cooking  Avoid adding salt when cooking. Use salt-free seasonings or herbs instead of table salt or sea salt. Check with your health care provider or pharmacist before using salt substitutes.  Do not fry foods. Cook foods using healthy methods such as baking, boiling, grilling, and broiling instead.  Cook with  heart-healthy oils, such as olive, canola, soybean, or sunflower oil. Meal planning  Eat a balanced diet that includes: ? 5 or more servings of fruits and vegetables each day. At each meal, try to fill half of your plate with fruits and vegetables. ? Up to 6-8 servings of whole grains each day. ? Less than 6 oz of lean meat, poultry, or fish each day. A 3-oz serving of meat is about the same size as a deck of cards. One egg equals 1 oz. ? 2 servings of low-fat dairy each day. ? A serving of nuts, seeds, or beans 5 times each week. ? Heart-healthy fats. Healthy fats called Omega-3 fatty acids are found in foods such as flaxseeds and coldwater fish, like sardines, salmon, and mackerel.  Limit how much you eat of the following: ? Canned or prepackaged foods. ? Food that is high in trans fat, such as fried foods. ? Food that is high in saturated fat, such as fatty meat. ? Sweets, desserts, sugary drinks, and other foods with added sugar. ? Full-fat dairy products.  Do not salt foods before eating.  Try to eat at least 2 vegetarian meals each week.  Eat more home-cooked food and less restaurant, buffet, and fast food.  When eating at a restaurant, ask that your food be prepared with less salt or no salt, if possible. What foods are recommended? The items listed may not be a complete list. Talk with your dietitian about   what dietary choices are best for you. Grains Whole-grain or whole-wheat bread. Whole-grain or whole-wheat pasta. Brown rice. Oatmeal. Quinoa. Bulgur. Whole-grain and low-sodium cereals. Pita bread. Low-fat, low-sodium crackers. Whole-wheat flour tortillas. Vegetables Fresh or frozen vegetables (raw, steamed, roasted, or grilled). Low-sodium or reduced-sodium tomato and vegetable juice. Low-sodium or reduced-sodium tomato sauce and tomato paste. Low-sodium or reduced-sodium canned vegetables. Fruits All fresh, dried, or frozen fruit. Canned fruit in natural juice (without  added sugar). Meat and other protein foods Skinless chicken or turkey. Ground chicken or turkey. Pork with fat trimmed off. Fish and seafood. Egg whites. Dried beans, peas, or lentils. Unsalted nuts, nut butters, and seeds. Unsalted canned beans. Lean cuts of beef with fat trimmed off. Low-sodium, lean deli meat. Dairy Low-fat (1%) or fat-free (skim) milk. Fat-free, low-fat, or reduced-fat cheeses. Nonfat, low-sodium ricotta or cottage cheese. Low-fat or nonfat yogurt. Low-fat, low-sodium cheese. Fats and oils Soft margarine without trans fats. Vegetable oil. Low-fat, reduced-fat, or light mayonnaise and salad dressings (reduced-sodium). Canola, safflower, olive, soybean, and sunflower oils. Avocado. Seasoning and other foods Herbs. Spices. Seasoning mixes without salt. Unsalted popcorn and pretzels. Fat-free sweets. What foods are not recommended? The items listed may not be a complete list. Talk with your dietitian about what dietary choices are best for you. Grains Baked goods made with fat, such as croissants, muffins, or some breads. Dry pasta or rice meal packs. Vegetables Creamed or fried vegetables. Vegetables in a cheese sauce. Regular canned vegetables (not low-sodium or reduced-sodium). Regular canned tomato sauce and paste (not low-sodium or reduced-sodium). Regular tomato and vegetable juice (not low-sodium or reduced-sodium). Pickles. Olives. Fruits Canned fruit in a light or heavy syrup. Fried fruit. Fruit in cream or butter sauce. Meat and other protein foods Fatty cuts of meat. Ribs. Fried meat. Bacon. Sausage. Bologna and other processed lunch meats. Salami. Fatback. Hotdogs. Bratwurst. Salted nuts and seeds. Canned beans with added salt. Canned or smoked fish. Whole eggs or egg yolks. Chicken or turkey with skin. Dairy Whole or 2% milk, cream, and half-and-half. Whole or full-fat cream cheese. Whole-fat or sweetened yogurt. Full-fat cheese. Nondairy creamers. Whipped toppings.  Processed cheese and cheese spreads. Fats and oils Butter. Stick margarine. Lard. Shortening. Ghee. Bacon fat. Tropical oils, such as coconut, palm kernel, or palm oil. Seasoning and other foods Salted popcorn and pretzels. Onion salt, garlic salt, seasoned salt, table salt, and sea salt. Worcestershire sauce. Tartar sauce. Barbecue sauce. Teriyaki sauce. Soy sauce, including reduced-sodium. Steak sauce. Canned and packaged gravies. Fish sauce. Oyster sauce. Cocktail sauce. Horseradish that you find on the shelf. Ketchup. Mustard. Meat flavorings and tenderizers. Bouillon cubes. Hot sauce and Tabasco sauce. Premade or packaged marinades. Premade or packaged taco seasonings. Relishes. Regular salad dressings. Where to find more information:  National Heart, Lung, and Blood Institute: www.nhlbi.nih.gov  American Heart Association: www.heart.org Summary  The DASH eating plan is a healthy eating plan that has been shown to reduce high blood pressure (hypertension). It may also reduce your risk for type 2 diabetes, heart disease, and stroke.  With the DASH eating plan, you should limit salt (sodium) intake to 2,300 mg a day. If you have hypertension, you may need to reduce your sodium intake to 1,500 mg a day.  When on the DASH eating plan, aim to eat more fresh fruits and vegetables, whole grains, lean proteins, low-fat dairy, and heart-healthy fats.  Work with your health care provider or diet and nutrition specialist (dietitian) to adjust your eating plan to your   individual calorie needs. This information is not intended to replace advice given to you by your health care provider. Make sure you discuss any questions you have with your health care provider. Document Released: 02/02/2011 Document Revised: 01/26/2017 Document Reviewed: 02/07/2016 Elsevier Patient Education  2020 [employer].   Chronic Venous Insufficiency Chronic venous insufficiency is a condition where the leg veins cannot  effectively pump blood from the legs to the heart. This happens when the vein walls are either stretched, weakened, or damaged, or when the valves inside the vein are damaged. With the right treatment, you should be able to continue with an active life. This condition is also called venous stasis. What are the causes? Common causes of this condition include:  High blood pressure inside the veins (venous hypertension).  Sitting or standing too long, causing increased blood pressure in the leg veins.  A blood clot that blocks blood flow in a vein (deep vein thrombosis, DVT).  Inflammation of a vein (phlebitis) that causes a blood clot to form.  Tumors in the pelvis that cause blood to back up. What increases the risk? The following factors may make you more likely to develop this condition:  Having a family history of this condition.  Obesity.  Pregnancy.  Living without enough regular physical activity or exercise (sedentary lifestyle).  Smoking.  Having a job that requires long periods of standing or sitting in one place.  Being a certain age. Women in their 47s and 64s and men in their 61s are more likely to develop this condition. What are the signs or symptoms? Symptoms of this condition include:  Veins that are enlarged, bulging, or twisted (varicose veins).  Skin breakdown or ulcers.  Reddened skin or dark discoloration of skin on the leg between the knee and ankle.  Brown, smooth, tight, and painful skin just above the ankle, usually on the inside of the leg (lipodermatosclerosis).  Swelling of the legs. How is this diagnosed? This condition may be diagnosed based on:  Your medical history.  A physical exam.  Tests, such as: ? A procedure that creates an image of a blood vessel and nearby organs and provides information about blood flow through the blood vessel (duplex ultrasound). ? A procedure that tests blood flow (plethysmography). ? A procedure that looks  at the veins using X-ray and dye (venogram). How is this treated? The goals of treatment are to help you return to an active life and to minimize pain or disability. Treatment depends on the severity of your condition, and it may include:  Wearing compression stockings. These can help relieve symptoms and help prevent your condition from getting worse. However, they do not cure the condition.  Sclerotherapy. This procedure involves an injection of a solution that shrinks damaged veins.  Surgery. This may involve: ? Removing a diseased vein (vein stripping). ? Cutting off blood flow through the vein (laser ablation surgery). ? Repairing or reconstructing a valve within the affected vein. Follow these instructions at home:      Wear compression stockings as told by your health care provider. These stockings help to prevent blood clots and reduce swelling in your legs.  Take over-the-counter and prescription medicines only as told by your health care provider.  Stay active by exercising, walking, or doing different activities. Ask your health care provider what activities are safe for you and how much exercise you need.  Drink enough fluid to keep your urine pale yellow.  Do not use any products  that contain nicotine or tobacco, such as cigarettes, e-cigarettes, and chewing tobacco. If you need help quitting, ask your health care provider.  Keep all follow-up visits as told by your health care provider. This is important. Contact a health care provider if you:  Have redness, swelling, or more pain in the affected area.  See a red streak or line that goes up or down from the affected area.  Have skin breakdown or skin loss in the affected area, even if the breakdown is small.  Get an injury in the affected area. Get help right away if:  You get an injury and an open wound in the affected area.  You have: ? Severe pain that does not get better with medicine. ? Sudden numbness or  weakness in the foot or ankle below the affected area. ? Trouble moving your foot or ankle. ? A fever. ? Worse or persistent symptoms. ? Chest pain. ? Shortness of breath. Summary  Chronic venous insufficiency is a condition where the leg veins cannot effectively pump blood from the legs to the heart.  Chronic venous insufficiency occurs when the vein walls become stretched, weakened, or damaged, or when valves within the vein are damaged.  Treatment depends on how severe your condition is. It often involves wearing compression stockings and may involve having a procedure.  Make sure you stay active by exercising, walking, or doing different activities. Ask your health care provider what activities are safe for you and how much exercise you need. This information is not intended to replace advice given to you by your health care provider. Make sure you discuss any questions you have with your health care provider. Document Released: 06/19/2006 Document Revised: 11/06/2017 Document Reviewed: 11/06/2017 Elsevier Patient Education  2020 [employer].

## 2018-10-11 NOTE — Progress Notes (Signed)
Name: Christine [name]   MRN: [id number]    DOB: 18-Mar-1970   Date:10/11/2018       Progress Note  Subjective  Chief Complaint  Chief Complaint  Patient presents with  . Foot Swelling    right foot pain  . Joint Swelling    bilateral ankle swelling  . Facial Swelling    around eyes noticed this morning    HPI  Pt presents with concern for 2 weeks of BLE edema; this morning she woke up and had some mild swelling under the eyes.  She has been having RIGHT heel pain for about 2 weeks as well - notes that there has been icing the area daily.  She denies shortness of breath, chest pain, no history of HTN except in pregnancy.  No history of blood clots, allergies, kidney issues, family history of kidney issues or heart failure. She does wear supportive shoes and has been using compression stockings whenever she works (4 days a week).  Does note decreased urine output - urinating about twice daily.  She did have dental appointment 4 days ago - routine cleaning, no procedures, did not have any swelling or pain immediately after this visit.   Vaginal Odor: She has recurrent BV, no new sexual partners.  She has notes new onset foul vaginal odor. No abnormal vaginal bleeding/itching/discharge, no abdominal pain. She is taking a probiotic. She has tried boric acid suppositories.   Patient Active Problem List   Diagnosis Date Noted  . Tobacco abuse 04/15/2018  . Preventative health care 02/09/2018  . Obesity (BMI 30.0-34.9) 02/09/2018  . Multiple personality disorder ([place]) 01/03/2018  . Mild episode of recurrent major depressive disorder ([place]) 01/03/2018  . Generalized anxiety disorder 01/03/2018  . Menorrhagia with regular cycle 12/10/2013  . Menorrhagia with irregular cycle 11/19/2013    Social History   Tobacco Use  . Smoking status: Current Every Day Smoker    Packs/day: 0.50    Years: 30.00    Pack years: 15.00    Types: Cigarettes  . Smokeless tobacco: Never Used  Substance  Use Topics  . Alcohol use: No     Current Outpatient Medications:  .  rizatriptan (MAXALT-MLT) 10 MG disintegrating tablet, Take 1 tablet by mouth as needed., Disp: , Rfl: 6 .  clonazePAM (KLONOPIN) 1 MG tablet, Take 1 tablet by mouth daily., Disp: , Rfl:  .  Naproxen 375 MG TBEC, Take one by mouth every 12 hours if needed; take with food; tylenol is okay per package directions (Patient not taking: Reported on 10/11/2018), Disp: 60 each, Rfl: 0 .  SUMAtriptan (IMITREX) 50 MG tablet, Take 50 mg by mouth every 2 (two) hours as needed for migraine. , Disp: , Rfl:  .  topiramate (TOPAMAX) 100 MG tablet, Take 100 mg by mouth 4 (four) times daily as needed., Disp: , Rfl:   Allergies  Allergen Reactions  . Zoloft [Sertraline Hcl]     Unpleasant thoughts    I personally reviewed active problem list, medication list, allergies, notes from last encounter, lab results with the patient/caregiver today.  ROS  Constitutional: Negative for fever or weight change.  Respiratory: Negative for cough and shortness of breath.   Cardiovascular: Negative for chest pain or palpitations.  Gastrointestinal: Negative for abdominal pain, no bowel changes.  Musculoskeletal: Negative for gait problem or joint swelling.  Skin: Negative for rash.  Neurological: Negative for dizziness or headache.  No other specific complaints in a complete review of systems (except as  listed in HPI above).  Objective  Vitals:   10/11/18 1059  BP: 128/70  Pulse: 76  Resp: 14  Temp: 97.8 F (36.6 C)  TempSrc: Oral  SpO2: 99%  Weight: 224 lb 8 oz (101.8 kg)  Height: 5\' 6"  (1.676 m)   Body mass index is 36.24 kg/m.  Nursing Note and Vital Signs reviewed.  Physical Exam  Constitutional: Patient appears well-developed and well-nourished. No distress.  HENT: Head: Normocephalic and atraumatic. Nose: Nose normal. Mouth/Throat: Oropharynx is clear and moist. No oropharyngeal exudate or tonsillar swelling. Sinuses are  non-tender.  There is no swelling of the face or oral mucosa.  Eyes: Conjunctivae and EOM are normal. No scleral icterus.  Pupils are equal, round, and reactive to light. Neck: Normal range of motion. Neck supple. No JVD present. No thyromegaly present.  Cardiovascular: Normal rate, regular rhythm and normal heart sounds.  No murmur heard. There is trace BLE edema - non-pitting. Pulmonary/Chest: Effort normal and breath sounds normal. No respiratory distress. Musculoskeletal: Normal range of motion, no joint effusions. No gross deformities.  There is tenderness to the RIGHT heel.  Neurological: Pt is alert and oriented to person, place, and time. No cranial nerve deficit. Coordination, balance, strength, speech and gait are normal.  Skin: Skin is warm and dry. No rash noted. No erythema.  Psychiatric: Patient has a normal mood and affect. behavior is normal. Judgment and thought content normal.   No results found for this or any previous visit (from the past 72 hour(s)).  Assessment & Plan  1. Bilateral lower extremity edema - COMPLETE METABOLIC PANEL WITH GFR - CBC with Differential/Platelet - Microalbumin / creatinine urine ratio - Lungs are clear today, low suspicion for CHF; suspect Venous insufficiency vs abnormal renal function.  Labs as above. Wear compression stockings 8 hours a day, off at night; elevate feet night.   2. Pain of right heel - Advised to roll frozen water bottle and do gentle stretches daily; wear supportive shoes; call back in 2-3 weeks if not improving will refer to podiatry (did see [facility] in the past and would be comfortable going back to them).  3. Lipid screening - Lipid panel  4. Vaginal odor - Cervicovaginal ancillary only  -Red flags and when to present for emergency care or RTC including fever >101.31F, chest pain, shortness of breath, new/worsening/un-resolving symptoms, reviewed with patient at time of visit. Follow up and care instructions  discussed and provided in AVS.  Face-to-face time with patient was more than 25 minutes, >50% time spent counseling and coordination of care

## 2018-10-12 LAB — COMPLETE METABOLIC PANEL WITH GFR
AG Ratio: 1.4 (calc) (ref 1.0–2.5)
ALT: 12 U/L (ref 6–29)
AST: 13 U/L (ref 10–35)
Albumin: 3.6 g/dL (ref 3.6–5.1)
Alkaline phosphatase (APISO): 55 U/L (ref 31–125)
BUN: 12 mg/dL (ref 7–25)
CO2: 26 mmol/L (ref 20–32)
Calcium: 8.8 mg/dL (ref 8.6–10.2)
Chloride: 110 mmol/L (ref 98–110)
Creat: 0.6 mg/dL (ref 0.50–1.10)
GFR, Est African American: 124 mL/min/{1.73_m2} (ref >=60)
GFR, Est Non African American: 107 mL/min/{1.73_m2} (ref >=60)
Globulin: 2.5 g/dL (calc) (ref 1.9–3.7)
Glucose, Bld: 80 mg/dL (ref 65–99)
Potassium: 4.1 mmol/L (ref 3.5–5.3)
Sodium: 141 mmol/L (ref 135–146)
Total Bilirubin: 0.3 mg/dL (ref 0.2–1.2)
Total Protein: 6.1 g/dL (ref 6.1–8.1)

## 2018-10-12 LAB — LIPID PANEL
Cholesterol: 179 mg/dL (ref <200)
HDL: 50 mg/dL (ref >=50)
LDL Cholesterol (Calc): 114 mg/dL (calc) — ABNORMAL HIGH
Non-HDL Cholesterol (Calc): 129 mg/dL (calc) (ref <130)
Total CHOL/HDL Ratio: 3.6 (calc) (ref <5.0)
Triglycerides: 64 mg/dL (ref <150)

## 2018-10-12 LAB — CBC WITH DIFFERENTIAL/PLATELET
Absolute Monocytes: 624 cells/uL (ref 200–950)
Basophils Absolute: 47 cells/uL (ref 0–200)
Basophils Relative: 0.6 %
Eosinophils Absolute: 284 cells/uL (ref 15–500)
Eosinophils Relative: 3.6 %
HCT: 36.9 % (ref 35.0–45.0)
Hemoglobin: 12.3 g/dL (ref 11.7–15.5)
Lymphs Abs: 2212 cells/uL (ref 850–3900)
MCH: 29.9 pg (ref 27.0–33.0)
MCHC: 33.3 g/dL (ref 32.0–36.0)
MCV: 89.8 fL (ref 80.0–100.0)
MPV: 11.4 fL (ref 7.5–12.5)
Monocytes Relative: 7.9 %
Neutro Abs: 4732 cells/uL (ref 1500–7800)
Neutrophils Relative %: 59.9 %
Platelets: 228 10*3/uL (ref 140–400)
RBC: 4.11 10*6/uL (ref 3.80–5.10)
RDW: 12.4 % (ref 11.0–15.0)
Total Lymphocyte: 28 %
WBC: 7.9 10*3/uL (ref 3.8–10.8)

## 2018-10-12 LAB — MICROALBUMIN / CREATININE URINE RATIO
Creatinine, Urine: 14 mg/dL — ABNORMAL LOW (ref 20–275)
Microalb, Ur: <0.2 mg/dL

## 2018-10-14 LAB — CERVICOVAGINAL ANCILLARY ONLY
Bacterial vaginitis: POSITIVE — AB
Candida vaginitis: NEGATIVE
Trichomonas: NEGATIVE

## 2018-10-15 ENCOUNTER — Other Ambulatory Visit: Payer: Self-pay | Admitting: Family Medicine

## 2018-10-15 DIAGNOSIS — B9689 Other specified bacterial agents as the cause of diseases classified elsewhere: Secondary | ICD-10-CM

## 2018-10-15 DIAGNOSIS — N760 Acute vaginitis: Secondary | ICD-10-CM

## 2018-10-15 MED ORDER — METRONIDAZOLE 500 MG PO TABS: 500.0000 mg | ORAL_TABLET | Freq: Two times a day (BID) | ORAL | 0 refills | Status: AC

## 2018-11-13 ENCOUNTER — Telehealth: Payer: Self-pay | Admitting: Family Medicine

## 2018-11-13 DIAGNOSIS — M79671 Pain in right foot: Secondary | ICD-10-CM

## 2018-11-13 NOTE — Telephone Encounter (Signed)
Patient is request a referral to a sports medicine dr. Patient is still having pain in right heel up to her leg.  Patient has seen PCP regarding this. Patient states leg is not swollen. Patient call back [phone]

## 2018-11-15 NOTE — Telephone Encounter (Signed)
Please put in referral

## 2018-11-16 NOTE — Telephone Encounter (Signed)
I placed referral per our discussion at her last visit; I sent to sports med, let me know if she'd prefer to see podiatrist instead and I can change the referral.

## 2018-11-19 NOTE — Telephone Encounter (Signed)
Left message for patient

## 2018-12-06 ENCOUNTER — Encounter: Payer: Self-pay | Admitting: Sports Medicine

## 2018-12-06 ENCOUNTER — Ambulatory Visit (INDEPENDENT_AMBULATORY_CARE_PROVIDER_SITE_OTHER): Payer: Managed Care, Other (non HMO) | Admitting: Sports Medicine

## 2018-12-06 ENCOUNTER — Other Ambulatory Visit: Payer: Self-pay

## 2018-12-06 VITALS — BP 112/86 | Ht 66.0 in | Wt 215.0 lb

## 2018-12-06 DIAGNOSIS — M722 Plantar fascial fibromatosis: Secondary | ICD-10-CM

## 2018-12-06 NOTE — Progress Notes (Signed)
[facility] [place] [address] [place],  [address] Phone: [phone] Fax: [phone]   Patient Name: Christine [name] Date of Birth: [birth year] Medical Record Number: [id number] Gender: female Date of Encounter: 12/06/2018  CC: Right heel pain  HPI: Christine [name] is a 48 year old female here c/o right heel pain. Pain started 2-3 months ago. No MOI or inciting event.  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  Aggravating factors include first up in the morning and walking after sitting. Alleviating factors include foot brace and rest. No radiating symptoms or instability of the ankle. No hx of trauma or injury to area in the past.   Past Medical History:  Diagnosis Date  . Anxiety   . Arthritis   . Depression   . Irregular uterine bleeding   . Migraines   . Multiple personality disorder ([place])     Current Outpatient Medications on File Prior to Visit  Medication Sig Dispense Refill  . rizatriptan (MAXALT-MLT) 10 MG disintegrating tablet Take 1 tablet by mouth as needed.  6  . topiramate (TOPAMAX) 100 MG tablet Take 100 mg by mouth 4 (four) times daily as needed.     No current facility-administered medications on file prior to visit.     Past Surgical History:  Procedure Laterality Date  . BREAST BIOPSY N/A early 2000s   benign  . [place]   x 2   . DILITATION & CURRETTAGE/HYSTROSCOPY WITH THERMACHOICE ABLATION N/A 01/13/2014   Procedure: DILATATION & CURETTAGE/HYSTEROSCOPY WITH THERMACHOICE ABLATION;  Surgeon: [name], MD;  Location: [facility];  Service: Gynecology;  Laterality: N/A;  uteral sound is to 11cm,47ml of D5W in, 24ml out. temp:87 degree celcious total therapy time-41min, 57 sec  . TUBAL LIGATION      Allergies  Allergen Reactions  . Zoloft [Sertraline Hcl]     Unpleasant thoughts    Social History   Socioeconomic History  . Marital status: Divorced    Spouse name: Not on  file  . Number of children: 3  . Years of education: 46  . Highest education level: Some college, no degree  Occupational History  . Occupation: [place]  . Financial resource strain: Not hard at all  . Food insecurity    Worry: Never true    Inability: Never true  . Transportation needs    Medical: No    Non-medical: No  Tobacco Use  . Smoking status: Current Every Day Smoker    Packs/day: 0.50    Years: 30.00    Pack years: 15.00    Types: Cigarettes  . Smokeless tobacco: Never Used  Substance and Sexual Activity  . Alcohol use: No  . Drug use: No  . Sexual activity: Yes    Birth control/protection: Surgical  Lifestyle  . Physical activity    Days per week: 0 days    Minutes per session: 0 min  . Stress: Not at all  Relationships  . Social connections    Talks on phone: More than three times a week    Gets together: Once a week    Attends religious service: Never    Active member of club or organization: No    Attends meetings of clubs or organizations: Never    Relationship status: Divorced  . Intimate partner violence    Fear of current or ex partner: No    Emotionally abused: No  Physically abused: No    Forced sexual activity: No  Other Topics Concern  . 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 Hypertension Mother   . Heart attack Father   . Stroke Maternal Grandmother   . Heart attack Maternal Grandfather     BP 112/86   Ht 5\' 6"  (1.676 m)   Wt 215 lb (97.5 kg)   BMI 34.70 kg/m   ROS:  See HPI CONST: no F/C, no malaise, no fatigue MSK: See above NEURO: no numbness/tingling, no weakness SKIN: no rash, no lesions HEME: no bleeding, no bruising, no erythema  Objective: GEN: Alert and oriented, NAD Pulm: Breathing unlabored PSY: normal mood, congruent affect  Right foot No gross deformity, swelling, ecchymoses FROM at ankle and toes  TTP medial plantar calcaneus Negative ant drawer and talar tilt.   Negative squeeze test Thompsons test negative. NV intact distally.  Left foot No gross deformity, swelling, ecchymoses FROM No TTP Negative ant drawer and talar tilt.   Negative squeeze test Thompsons test negative. NV intact distally.  Limited MSK ultrasound Right plantar fascia was visualized in long and short axis.  There was no cortical irregularity or neovascularization along calcaneus.  The plantar fascia measured at 0.6 mm.  Impression: Thickening of plantar fascia consistent with plantar fasciitis  Assessment and Plan:  1.  Right plantar fasciitis  History, physical and ultrasound findings consistent with plantar fasciitis.  I fitted patient with green Hapads with a scaphoid pad in the right shoe.  She is to continue wearing her compression brace in the foot. She is to start an HEP.  Can use anti-inflammatory as needed.  She will follow-up in 3 weeks, if no improvement we can consider an injection.   Lanier Clam, DO, [facility] Sports Medicine Fellow

## 2018-12-06 NOTE — Patient Instructions (Signed)
You have plantar fasciitis Take tylenol and/or aleve as needed for pain  Plantar fascia stretch for 20-30 seconds (do 3 of these) in morning Lowering/raise on a step exercises 3 x 10 once or twice a day - this is very important for long term recovery. Can add heel walks, toe walks forward and backward as well Ice heel for 15 minutes as needed. Avoid flat shoes/barefoot walking as much as possible. Arch straps have been shown to help with pain. Inserts are important Steroid injection is a consideration for short term pain relief if you are struggling. Physical therapy is also an option. Follow up with me in 3 weeks.

## 2018-12-27 ENCOUNTER — Ambulatory Visit: Payer: Managed Care, Other (non HMO) | Admitting: Sports Medicine

## 2018-12-27 ENCOUNTER — Encounter: Payer: Self-pay | Admitting: Sports Medicine

## 2018-12-27 ENCOUNTER — Other Ambulatory Visit: Payer: Self-pay

## 2018-12-27 VITALS — BP 128/84 | Ht 66.0 in | Wt 215.0 lb

## 2018-12-27 DIAGNOSIS — M76821 Posterior tibial tendinitis, right leg: Secondary | ICD-10-CM | POA: Diagnosis not present

## 2018-12-27 DIAGNOSIS — M722 Plantar fascial fibromatosis: Secondary | ICD-10-CM | POA: Diagnosis not present

## 2018-12-27 MED ORDER — METHYLPREDNISOLONE ACETATE 40 MG/ML IJ SUSP
40.0000 mg | Freq: Once | INTRAMUSCULAR | Status: AC
  Administered 2018-12-27: 40 mg via INTRA_ARTICULAR

## 2018-12-27 NOTE — Progress Notes (Signed)
   [facility] [address] [place], [place] [address] Phone: [phone] Fax: [phone]   Patient Name: Christine [name] Date of Birth: Dec 02, 1970 Medical Record Number: [id number] Gender: female Date of Encounter: 12/27/2018  SUBJECTIVE:      Chief Complaint:  Right plantar fasciitis   HPI:  Christine [name] is following up for right plantar fasciitis.  She was doing well with the green [place], but was starting to have pain again while at work and on her feet.  Again pain is worse in the morning.  She is also describing pain that is radiating to the medial anterior dorsum of her foot.  Is interested in an injection at this time.  He denies numbness or tingling into her toes.  No swelling or erythema.     ROS:     See HPI.   PERTINENT  PMH / PSH / FH / SH:  Past Medical, Surgical, Social, and Family History Reviewed & Updated in the EMR.    OBJECTIVE:  BP 128/84   Ht 5\' 6"  (1.676 m)   Wt 215 lb (97.5 kg)   BMI 34.70 kg/m  Physical Exam:  Vital signs are reviewed.   GEN: Alert and oriented, NAD Pulm: Breathing unlabored PSY: normal mood, congruent affect  MSK: Right foot No gross deformity, swelling, ecchymoses FROM at ankle and toes TTP medial plantar calcaneus TTP at navicular tuberosity Negative ant drawer and talar tilt.   Negative squeeze test Thompsons test negative. NV intact distally.  Consent was obtained for corticosteroid injection of his plantar fascia.  Consent was obtained prior to the injection.  The risks were explained to the patient including risk of infection, bleeding, rupture of plantar fascia.  40 mg Depo-Medrol 2 cc of lidocaine were injected around the plantar fascia.  This is done using sterile technique. Patient tolerated the injection well there were no complications.  ASSESSMENT & PLAN:   1. Right foot pain  I do believe she is started to develop some posterior tibialis tendinitis in an effort to compensate for  the plantar fasciitis.  We injected her plantar fascia today which I am hoping will give her relief.  I also gave her a printout of a night splint she can purchase and wear at bed.  She was also provided with HEP for posterior tibialis tendinitis.  She will follow-up with me in 1 month, at which time we can consider formal imaging versus supervised physical therapy.   Lanier Clam, DO, ATC Sports Medicine Fellow

## 2019-01-17 ENCOUNTER — Ambulatory Visit: Payer: Managed Care, Other (non HMO) | Admitting: Sports Medicine

## 2019-03-14 ENCOUNTER — Ambulatory Visit (INDEPENDENT_AMBULATORY_CARE_PROVIDER_SITE_OTHER): Payer: Managed Care, Other (non HMO) | Admitting: Family Medicine

## 2019-03-14 ENCOUNTER — Encounter: Payer: Self-pay | Admitting: Family Medicine

## 2019-03-14 ENCOUNTER — Other Ambulatory Visit: Payer: Self-pay

## 2019-03-14 VITALS — BP 142/78 | HR 80 | Temp 97.3°F | Resp 12 | Ht 66.0 in | Wt 231.9 lb

## 2019-03-14 DIAGNOSIS — M549 Dorsalgia, unspecified: Secondary | ICD-10-CM

## 2019-03-14 DIAGNOSIS — R319 Hematuria, unspecified: Secondary | ICD-10-CM | POA: Diagnosis not present

## 2019-03-14 DIAGNOSIS — N3001 Acute cystitis with hematuria: Secondary | ICD-10-CM

## 2019-03-14 DIAGNOSIS — R309 Painful micturition, unspecified: Secondary | ICD-10-CM

## 2019-03-14 LAB — POCT URINALYSIS DIPSTICK
Bilirubin, UA: NEGATIVE
Blood, UA: POSITIVE
Glucose, UA: NEGATIVE
Ketones, UA: NEGATIVE
Nitrite, UA: NEGATIVE
Protein, UA: NEGATIVE
Spec Grav, UA: 1.025 (ref 1.010–1.025)
Urobilinogen, UA: 0.2 E.U./dL
pH, UA: 6 (ref 5.0–8.0)

## 2019-03-14 MED ORDER — PHENAZOPYRIDINE HCL 200 MG PO TABS: 200.0000 mg | ORAL_TABLET | Freq: Three times a day (TID) | ORAL | 0 refills | Status: DC

## 2019-03-14 MED ORDER — CEPHALEXIN 500 MG PO CAPS: 500.0000 mg | ORAL_CAPSULE | Freq: Four times a day (QID) | ORAL | 0 refills | Status: AC

## 2019-03-14 NOTE — Patient Instructions (Signed)
Take antibiotics as directed drink a lot of water or other clear fluids.  You can take Pyridium for the next 1 to 2 days to help with your pain.  Your symptoms should be improving in the next 2 to 3 days please follow-up with [place] if you are not feeling better or your back pain continues after your treatment is done.  Would recommend follow-up right away if you have any worsening of your pain, fever, vomiting, intolerance of medicines.   Urinary Tract Infection, Adult A urinary tract infection (UTI) is an infection of any part of the urinary tract. The urinary tract includes:  The kidneys.  The ureters.  The bladder.  The urethra. These organs make, store, and get rid of pee (urine) in the body. What are the causes? This is caused by germs (bacteria) in your genital area. These germs grow and cause swelling (inflammation) of your urinary tract. What increases the risk? You are more likely to develop this condition if:  You have a small, thin tube (catheter) to drain pee.  You cannot control when you pee or poop (incontinence).  You are female, and: ? You use these methods to prevent pregnancy:  A medicine that kills sperm (spermicide).  A device that blocks sperm (diaphragm). ? You have low levels of a female hormone (estrogen). ? You are pregnant.  You have genes that add to your risk.  You are sexually active.  You take antibiotic medicines.  You have trouble peeing because of: ? A prostate that is bigger than normal, if you are female. ? A blockage in the part of your body that drains pee from the bladder (urethra). ? A kidney stone. ? A nerve condition that affects your bladder (neurogenic bladder). ? Not getting enough to drink. ? Not peeing often enough.  You have other conditions, such as: ? Diabetes. ? A weak disease-fighting system (immune system). ? Sickle cell disease. ? Gout. ? Injury of the spine. What are the signs or symptoms? Symptoms of this  condition include:  Needing to pee right away (urgently).  Peeing often.  Peeing small amounts often.  Pain or burning when peeing.  Blood in the pee.  Pee that smells bad or not like normal.  Trouble peeing.  Pee that is cloudy.  Fluid coming from the vagina, if you are female.  Pain in the belly or lower back. Other symptoms include:  Throwing up (vomiting).  No urge to eat.  Feeling mixed up (confused).  Being tired and grouchy (irritable).  A fever.  Watery poop (diarrhea). How is this treated? This condition may be treated with:  Antibiotic medicine.  Other medicines.  Drinking enough water. Follow these instructions at home:  Medicines  Take over-the-counter and prescription medicines only as told by your doctor.  If you were prescribed an antibiotic medicine, take it as told by your doctor. Do not stop taking it even if you start to feel better. General instructions  Make sure you: ? Pee until your bladder is empty. ? Do not hold pee for a long time. ? Empty your bladder after sex. ? Wipe from front to back after pooping if you are a female. Use each tissue one time when you wipe.  Drink enough fluid to keep your pee pale yellow.  Keep all follow-up visits as told by your doctor. This is important. Contact a doctor if:  You do not get better after 1-2 days.  Your symptoms go away and then come  back. Get help right away if:  You have very bad back pain.  You have very bad pain in your lower belly.  You have a fever.  You are sick to your stomach (nauseous).  You are throwing up. Summary  A urinary tract infection (UTI) is an infection of any part of the urinary tract.  This condition is caused by germs in your genital area.  There are many risk factors for a UTI. These include having a small, thin tube to drain pee and not being able to control when you pee or poop.  Treatment includes antibiotic medicines for germs.  Drink  enough fluid to keep your pee pale yellow. This information is not intended to replace advice given to you by your health care provider. Make sure you discuss any questions you have with your health care provider. Document Revised: 01/31/2018 Document Reviewed: 08/23/2017 Elsevier Patient Education  2020 [employer].

## 2019-03-14 NOTE — Progress Notes (Signed)
Name: Christine [name]   MRN: [id number]    DOB: [birth year]   Date:03/14/2019       Progress Note  Subjective:    Chief Complaint  Chief Complaint  Patient presents with  . Urinary Tract Infection    onset 1 week symptoms include: back pain, low abdominal pain, foul odor and painful at end of stream    I connected with  [name]  on 03/14/19 at  1:00 PM EST by a video enabled telemedicine application and verified that I am speaking with the correct person using two identifiers.  I discussed the limitations of evaluation and management by telemedicine and the availability of in person appointments. The patient expressed understanding and agreed to proceed. Staff also discussed with the patient that there may be a patient responsible charge related to this service. Patient Location: home Provider Location: [facility] clinic Additional Individuals present: none Pt came into clinic earlier today, thought her appt was earlier, Urine testing and VS done   HPI Onset of back pain, low abd pain and dysuria about one week ago.   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 She does not have a history of recurrent UTIs, diabetes or immunocompromise    Results for orders placed or performed in visit on 03/14/19  POCT Urinalysis Dipstick  Result Value Ref Range   Color, UA light yellow    Clarity, UA clear    Glucose, UA Negative Negative   Bilirubin, UA neg    Ketones, UA neg    Spec Grav, UA 1.025 1.010 - 1.025   Blood, UA positive    pH, UA 6.0 5.0 - 8.0   Protein, UA Negative Negative   Urobilinogen, UA 0.2 0.2 or 1.0 E.U./dL   Nitrite, UA neg    Leukocytes, UA Trace (A) Negative   Appearance clear    Odor foul        Patient Active Problem List   Diagnosis Date Noted  . Tobacco abuse 04/15/2018  . Preventative health care 02/09/2018  . Obesity (BMI 30.0-34.9) 02/09/2018  . Multiple personality disorder ([place]) 01/03/2018  . Mild episode of recurrent major depressive disorder ([place]) 01/03/2018  . Generalized anxiety disorder 01/03/2018  . Menorrhagia with regular cycle 12/10/2013  . Menorrhagia with irregular cycle 11/19/2013    Social History   Tobacco Use  . Smoking status: Current Every Day Smoker    Packs/day: 0.50    Years: 30.00    Pack years: 15.00    Types: Cigarettes  . Smokeless tobacco: Never Used  Substance Use Topics  . Alcohol use: No     Current Outpatient Medications:  .  rizatriptan (MAXALT-MLT) 10 MG disintegrating tablet, Take 1 tablet by mouth as needed., Disp: , Rfl: 6 .  topiramate (TOPAMAX) 100 MG tablet, Take 100 mg by mouth 4 (four) times daily as needed., Disp: , Rfl:   Allergies  Allergen Reactions  . Zoloft [Sertraline Hcl]     Unpleasant thoughts    I personally reviewed active problem list, medication list, allergies, family history, social history, health maintenance, notes from last encounter, lab  results, imaging with the patient/caregiver today.  Review of Systems  Constitutional: Negative.   HENT: Negative.   Eyes: Negative.   Respiratory: Negative.   Cardiovascular: Negative.   Gastrointestinal: Negative.   Endocrine: Negative.   Genitourinary: Negative.   Musculoskeletal: Negative.   Skin: Negative.   Allergic/Immunologic: Negative.   Neurological: Negative.   Hematological: Negative.   Psychiatric/Behavioral: Negative.   All other systems reviewed and are negative.    Objective:   Virtual encounter, vitals limited, only able to obtain the following Today's Vitals   03/14/19 1208  BP: (!)  142/78  Pulse: 80  Resp: 12  Temp: (!) 97.3 F (36.3 C)  SpO2: 98%  Weight: 231 lb 14.4 oz (105.2 kg)  Height: 5\' 6"  (1.676 m)   Body mass index is 37.43 kg/m. Nursing Note and Vital Signs reviewed.  Physical Exam Vitals and nursing note reviewed.  Constitutional:      General: She is not in acute distress.    Appearance: Normal appearance. She is well-developed. She is obese. She is not ill-appearing, toxic-appearing or diaphoretic.  HENT:     Head: Normocephalic and atraumatic.  Eyes:     General:        Right eye: No discharge.        Left eye: No discharge.     Conjunctiva/sclera: Conjunctivae normal.  Neck:     Trachea: No tracheal deviation.  Cardiovascular:     Rate and Rhythm: Normal rate.  Pulmonary:     Effort: Pulmonary effort is normal. No respiratory distress.     Breath sounds: No stridor.  Skin:    Coloration: Skin is not jaundiced or pale.     Findings: No rash.  Neurological:     Mental Status: She is alert.  Psychiatric:        Mood and Affect: Mood normal.        Behavior: Behavior normal.    @PHYSEXAM @ PE limited by telephone encounter  Results for orders placed or performed in visit on 03/14/19 (from the past 72 hour(s))  POCT Urinalysis Dipstick     Status: Abnormal   Collection Time: 03/14/19 12:15 PM  Result Value Ref Range   Color, UA light yellow    Clarity, UA clear    Glucose, UA Negative Negative   Bilirubin, UA neg    Ketones, UA neg    Spec Grav, UA 1.025 1.010 - 1.025   Blood, UA positive    pH, UA 6.0 5.0 - 8.0   Protein, UA Negative Negative   Urobilinogen, UA 0.2 0.2 or 1.0 E.U./dL   Nitrite, UA neg    Leukocytes, UA Trace (A) Negative   Appearance clear    Odor foul     Assessment and Plan:     ICD-10-CM   1. Painful urination  R30.9 POCT Urinalysis Dipstick    Urine Culture    phenazopyridine (PYRIDIUM) 200 MG tablet  2. Back pain, unspecified back location, unspecified back pain laterality, unspecified  chronicity  M54.9 POCT Urinalysis Dipstick    Urine Culture  3. Hematuria, unspecified type  R31.9 Urine Culture    phenazopyridine (PYRIDIUM) 200 MG tablet  4. Acute cystitis with hematuria  N30.01 cephALEXin (KEFLEX) 500 MG capsule    phenazopyridine (PYRIDIUM) 200 MG tablet    Patient with symptoms consistent with cystitis/UTI, unclear if low back pain is related did encourage the patient to follow-up if not improving, urinalysis is pertinent for trace leukocytes and blood we will start  treatment based off of the history, urine culture is pending, follow-up if not improving.  -Red flags and when to present for emergency care or RTC including fever >101.25F, chest pain, shortness of breath, new/worsening/un-resolving symptoms,  reviewed with patient at time of visit. Follow up and care instructions discussed and provided in AVS. - I discussed the assessment and treatment plan with the patient. The patient was provided an opportunity to ask questions and all were answered. The patient agreed with the plan and demonstrated an understanding of the instructions.  I provided 8 minutes of non-face-to-face time during this encounter.  Delsa Grana, PA-C 03/14/19 1:11 PM

## 2019-03-15 LAB — URINE CULTURE
MICRO NUMBER:: 10047845
SPECIMEN QUALITY:: ADEQUATE

## 2019-04-23 NOTE — Progress Notes (Signed)
Patient ID: [name], female    DOB: 27-Jun-1970, 49 y.o.   MRN: [id number]  PCP: Christine Grana, PA-C  Chief Complaint  Patient presents with  . Possible UTI    left side back pain and painful urination    Subjective:   Christine [name] is a 49 y.o. female, presents to clinic with CC of the following:  Chief Complaint  Patient presents with  . Possible UTI    left side back pain and painful urination    HPI:  Patient is a 49 y.o. female who was seen 03/14/2019 by Christine [name] for UTI concerns and treated empirically with keflex and pyridium.  Her urine had trace leukocytes and blood, with culture returning no growth She presented with  back pain, low abd pain and dysuria for one week with the Abd pain suprapubic and associated with urinary frequency, urgency, bladder pressure  She returns today with painful urination and back pain on left.  She did note the pain got better after she was seen previously, but then about 2 to 3 weeks ago, again she started to notice some discomfort at the end of urination, and the urine is foul-smelling, like a "can of tuna" by her description.  Some urgency and frequency.  No blood noted.  In the last few days, she has had some left low back pain, with no trauma preceding, not more painful with movements, and described as more of an ache, like someone hit me there.  She denies any vaginal symptoms, no fevers, no upper respiratory infectious symptoms.  She has no history of kidney stones.  Denies any abdominal pains or suprapubic pains.  Tob - current smoker  Patient Active Problem List   Diagnosis Date Noted  . Tobacco abuse 04/15/2018  . Preventative health care 02/09/2018  . Obesity (BMI 30.0-34.9) 02/09/2018  . Multiple personality disorder ([place]) 01/03/2018  . Mild episode of recurrent major depressive disorder ([place]) 01/03/2018  . Generalized anxiety disorder 01/03/2018  . Menorrhagia with regular cycle 12/10/2013  . Menorrhagia with  irregular cycle 11/19/2013      Current Outpatient Medications:  .  rizatriptan (MAXALT-MLT) 10 MG disintegrating tablet, Take 1 tablet by mouth as needed., Disp: , Rfl: 6 .  nitrofurantoin, macrocrystal-monohydrate, (MACROBID) 100 MG capsule, Take 1 capsule (100 mg total) by mouth 2 (two) times daily for 5 days., Disp: 10 capsule, Rfl: 0 .  phenazopyridine (PYRIDIUM) 200 MG tablet, Take 1 tablet (200 mg total) by mouth 3 (three) times daily. (Patient not taking: Reported on 04/24/2019), Disp: 6 tablet, Rfl: 0 .  topiramate (TOPAMAX) 100 MG tablet, Take 100 mg by mouth 4 (four) times daily as needed., Disp: , Rfl:    Allergies  Allergen Reactions  . Zoloft [Sertraline Hcl]     Unpleasant thoughts     Past Surgical History:  Procedure Laterality Date  . BREAST BIOPSY N/A early 2000s   benign  . [place]   x 2   . DILITATION & CURRETTAGE/HYSTROSCOPY WITH THERMACHOICE ABLATION N/A 01/13/2014   Procedure: DILATATION & CURETTAGE/HYSTEROSCOPY WITH THERMACHOICE ABLATION;  Surgeon: [name], MD;  Location: [facility];  Service: Gynecology;  Laterality: N/A;  uteral sound is to 11cm,55ml of D5W in, 4ml out. temp:87 degree celcious total therapy time-64min, 57 sec  . TUBAL LIGATION       Family History  Problem Relation Age of Onset  . Hypertension Mother   . Heart attack Father   .  Stroke Maternal Grandmother   . Heart attack Maternal Grandfather      Social History   Socioeconomic History  . Marital status: Divorced    Spouse name: Not on file  . Number of children: 3  . Years of education: 11  . Highest education level: Some college, no degree  Occupational History  . Occupation: [employer]  Tobacco Use  . Smoking status: Current Every Day Smoker    Packs/day: 0.50    Years: 30.00    Pack years: 15.00    Types: Cigarettes  . Smokeless tobacco: Never Used  Substance and Sexual Activity  . Alcohol use: No  . Drug use: No  . Sexual  activity: Yes    Birth control/protection: Surgical  Other Topics Concern  . 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 Difficulty of Paying Living Expenses: Not on file  Food Insecurity:   . Worried About Charity fundraiser in the Last Year: Not on file  . Ran Out of Food in the Last Year: Not on file  Transportation Needs:   . Lack of Transportation (Medical): Not on file  . Lack of Transportation (Non-Medical): Not on file  Physical Activity:   . Days of Exercise per Week: Not on file  . Minutes of Exercise per Session: Not on file  Stress:   . Feeling of Stress : Not on file  Social Connections:   . Frequency of Communication with Friends and Family: Not on file  . Frequency of Social Gatherings with Friends and Family: Not on file  . Attends Religious Services: Not on file  . Active Member of Clubs or Organizations: Not on file  . Attends Archivist Meetings: Not on file  . Marital Status: Not on file  Intimate Partner Violence:   . Fear of Current or Ex-Partner: Not on file  . Emotionally Abused: Not on file  . Physically Abused: Not on file  . Sexually Abused: Not on file    With staff assistance, above reviewed with the patient today.  ROS: As per HPI, otherwise no specific complaints on a limited and focused system review   Results for orders placed or performed in visit on 04/24/19 (from the past 72 hour(s))  POCT urinalysis dipstick     Status: Abnormal   Collection Time: 04/24/19  8:02 AM  Result Value Ref Range   Color, UA yellow    Clarity, UA clear    Glucose, UA Negative Negative   Bilirubin, UA negative    Ketones, UA negative    Spec Grav, UA 1.010 1.010 - 1.025   Blood, UA large    pH, UA 5.0 5.0 - 8.0   Protein, UA Positive (A) Negative   Urobilinogen, UA negative (A) 0.2 or 1.0 E.U./dL   Nitrite, UA negative    Leukocytes, UA Small (1+) (A)  Negative   Appearance clear    Odor none      PHQ2/9: Depression screen [facility] 2/9 04/24/2019 03/14/2019 10/11/2018 07/04/2018 04/15/2018  Decreased Interest 0 0 0 1 0  Down, Depressed, Hopeless 0 0 0 1 0  PHQ - 2 Score 0 0 0 2 0  Altered sleeping 0 0 0 2 0  Tired, decreased energy 1 0 0 2 0  Change in appetite 0 0 0 1 0  Feeling bad or failure about yourself  0 0 0 1 0  Trouble concentrating 0 0 0 1 0  Moving slowly or fidgety/restless 0 0 0 1 -  Suicidal thoughts 0 0 0 0 0  PHQ-9 Score 1 0 0 10 0  Difficult doing work/chores Not difficult at all Not difficult at all Not difficult at all Somewhat difficult Not difficult at all   PHQ-2/9 Result is neg  Fall Risk: Fall Risk  04/24/2019 03/14/2019 10/11/2018 07/04/2018 04/15/2018  Falls in the past year? 0 0 0 0 0  Number falls in past yr: 0 0 0 0 0  Injury with Fall? 0 0 0 0 0  Follow up - - Falls evaluation completed - -      Objective:   Vitals:   04/24/19 0753  BP: 124/82  Pulse: 100  Resp: 16  Temp: 97.9 F (36.6 C)  SpO2: 99%  Weight: 233 lb (105.7 kg)  Height: 5\' 6"  (1.676 m)    Body mass index is 37.61 kg/m.  Physical Exam   NAD, masked, obese HEENT - [place]/AT, sclera anicteric, conj - non-inj'ed, pharynx clear Neck - supple, no adenopathy,  Car - RRR without m/g/r Pulm- RR and effort normal at rest, CTA without wheeze or rales Abd - soft, obese, mildly tender right of the umbilicus, not marked, no rebound or guarding, nontender elsewhere, ND, BS+,  no masses Back - no CVA tenderness on the right or left.  Tender to palpation in the lower back on the left, in the muscle beds adjacent to the lower lumbar spine well below the left CVA region.  Nontender over the lumbar sacral spine with palpation. No radicular signs (nor symptoms ) Skin- no rash noted on exposed areas Neuro/psychiatric - affect was not flat, appropriate with conversation  Alert and oriented  Speech and gait are normal   Results for orders placed or  performed in visit on 04/24/19  POCT urinalysis dipstick  Result Value Ref Range   Color, UA yellow    Clarity, UA clear    Glucose, UA Negative Negative   Bilirubin, UA negative    Ketones, UA negative    Spec Grav, UA 1.010 1.010 - 1.025   Blood, UA large    pH, UA 5.0 5.0 - 8.0   Protein, UA Positive (A) Negative   Urobilinogen, UA negative (A) 0.2 or 1.0 E.U./dL   Nitrite, UA negative    Leukocytes, UA Small (1+) (A) Negative   Appearance clear    Odor none        Assessment & Plan:   1. Acute cystitis with hematuria We will treat empirically with an antibiotic with positive blood and leukocytes on the urine dip in the office.  Also had protein in the urine.  Will send for culture.  Really doubt a pyelonephritis concerned, with the tenderness well below the CVA region.  Given the recurrence concern, do feel checking a renal ultrasound is appropriate.  He denies any history of kidney stones prior. - POCT urinalysis dipstick - US Renal; Future - nitrofurantoin, macrocrystal-monohydrate, (MACROBID) 100 MG capsule; Take 1 capsule (100 mg total) by mouth 2 (two) times daily for 5 days.  Dispense: 10 capsule; Refill: 0 - Urine Culture  2. Acute left-sided low back pain without sciatica More likely mechanical then related to the kidney directly on exam, although as noted above, do feel checking a renal ultrasound is appropriate.  Sometimes can have referred pain to below the CVA region as noted to her on discussion. -  US Renal; Future  3. Class 2 obesity due to excess calories without serious comorbidity with body mass index (BMI) of 37.0 to 37.9 in adult   Await results of the ultrasound, and if symptoms do not improve or worsen despite the antibiotic initiated, is to follow-up.  Also await culture result.       Towanda Malkin, MD 04/24/19 8:18 AM

## 2019-04-24 ENCOUNTER — Encounter: Payer: Self-pay | Admitting: Internal Medicine

## 2019-04-24 ENCOUNTER — Other Ambulatory Visit: Payer: Self-pay

## 2019-04-24 ENCOUNTER — Ambulatory Visit (INDEPENDENT_AMBULATORY_CARE_PROVIDER_SITE_OTHER): Payer: Managed Care, Other (non HMO) | Admitting: Internal Medicine

## 2019-04-24 VITALS — BP 124/82 | HR 100 | Temp 97.9°F | Resp 16 | Ht 66.0 in | Wt 233.0 lb

## 2019-04-24 DIAGNOSIS — M545 Low back pain, unspecified: Secondary | ICD-10-CM

## 2019-04-24 DIAGNOSIS — N3001 Acute cystitis with hematuria: Secondary | ICD-10-CM | POA: Diagnosis not present

## 2019-04-24 DIAGNOSIS — E6609 Other obesity due to excess calories: Secondary | ICD-10-CM | POA: Diagnosis not present

## 2019-04-24 DIAGNOSIS — Z6837 Body mass index (BMI) 37.0-37.9, adult: Secondary | ICD-10-CM

## 2019-04-24 LAB — POCT URINALYSIS DIPSTICK
Bilirubin, UA: NEGATIVE
Glucose, UA: NEGATIVE
Ketones, UA: NEGATIVE
Nitrite, UA: NEGATIVE
Protein, UA: POSITIVE — AB
Spec Grav, UA: 1.01 (ref 1.010–1.025)
Urobilinogen, UA: NEGATIVE E.U./dL — AB
pH, UA: 5 (ref 5.0–8.0)

## 2019-04-24 MED ORDER — NITROFURANTOIN MONOHYD MACRO 100 MG PO CAPS: 100.0000 mg | ORAL_CAPSULE | Freq: Two times a day (BID) | ORAL | 0 refills | Status: AC

## 2019-04-28 ENCOUNTER — Other Ambulatory Visit: Payer: Self-pay | Admitting: Internal Medicine

## 2019-04-28 ENCOUNTER — Ambulatory Visit: Payer: Self-pay

## 2019-04-28 MED ORDER — SULFAMETHOXAZOLE-TRIMETHOPRIM 800-160 MG PO TABS: 1.0000 | ORAL_TABLET | Freq: Two times a day (BID) | ORAL | 0 refills | Status: AC

## 2019-04-28 NOTE — Progress Notes (Signed)
Patient was empirically treated with nitrofurantoin for concerns for cystitis, and she called noting she was still symptomatic.  We had not yet received the culture results via epic, and she noted she had and sent [place] a copy of the report. The culture grew 10,000-49,000 CFU/mL of Klebsiella (Enterobacter), with resistance to Augmentin and cefazolin.  Had intermediate resistance to nitrofurantoin.  Was sensitive to Bactrim, also Cipro.  Given the concerns noted with the antibiotic Cipro, will treat with Bactrim DS-1 twice daily for 7 days (feel a more prolonged course is indicated given the history that was reviewed). Also awaiting an ultrasound that was ordered of the kidneys last visit. She is to call and let us know if she is not improving as the week is ending, as may need to send off another sample and possibly change to a different medicine pending her status

## 2019-04-28 NOTE — Telephone Encounter (Signed)
See order only encounter. Ordered Bactrim DS one tab twice daily for 7 days. To let us know as week is ending if not better, and also await renal U/S ordered.   [facility]

## 2019-04-28 NOTE — Telephone Encounter (Signed)
Pt. Reports she was seen 04/24/19 by Dr. Roxan Hockey and treated for a UTI. States she feels worse, but thought the Macrobid was possibly making her feel bad. Has bilateral flank pain, nausea and had vomiting Friday. Decreased urinary output, but is drinking "a lot of water." Denies any fever.Would like to know what to do. Please advise pt. Contact number [phone].  Answer Assessment - Initial Assessment Questions 1.   NAME of MEDICATION: "What medicine are you calling about?"     Macrobid 2.   QUESTION: "What is your question?"     Could it cause back pain, nausea, vomiting 3.   PRESCRIBING HCP: "Who prescribed it?" Reason: if prescribed by specialist, call should be referred to that group.     Dr. Roxan Hockey 4. SYMPTOMS: "Do you have any symptoms?"     Yes as above 5. SEVERITY: If symptoms are present, ask "Are they mild, moderate or severe?"     Moderate 6.  PREGNANCY:  "Is there any chance that you are pregnant?" "When was your last menstrual period?"     No  Protocols used: MEDICATION QUESTION CALL-A-AH

## 2019-05-28 ENCOUNTER — Ambulatory Visit: Payer: Self-pay

## 2019-05-28 ENCOUNTER — Other Ambulatory Visit: Payer: Self-pay

## 2019-05-28 ENCOUNTER — Encounter: Payer: Self-pay | Admitting: Sports Medicine

## 2019-05-28 ENCOUNTER — Ambulatory Visit: Payer: Managed Care, Other (non HMO) | Admitting: Sports Medicine

## 2019-05-28 VITALS — BP 122/84 | Ht 66.0 in | Wt 229.0 lb

## 2019-05-28 DIAGNOSIS — M722 Plantar fascial fibromatosis: Secondary | ICD-10-CM | POA: Diagnosis not present

## 2019-05-28 MED ORDER — METHYLPREDNISOLONE ACETATE 40 MG/ML IJ SUSP
40.0000 mg | Freq: Once | INTRAMUSCULAR | Status: AC
  Administered 2019-05-28: 12:00:00 40 mg via INTRA_ARTICULAR

## 2019-05-28 NOTE — Progress Notes (Addendum)
   [facility] [address] [place], [place] [address] Phone: [phone] Fax: [phone]   Patient Name: Christine [name] Date of Birth: Jan 17, 1971 Medical Record Number: [id number] Gender: female Date of Encounter: 05/28/2019  SUBJECTIVE:      Chief Complaint:  Right plantar fasciitis   HPI:  Christine [name] is following up for right plantar fasciitis.  I last injected her on 12/27/2018 and she got significant relief from this.  Over the last week, the symptoms returned.  Pain is worse in the morning and better as she is walking.  She is using the sports insoles.  She is not wearing any kind of brace.  She denies any new injury.  No numbness or tingling.     ROS:     See HPI.   PERTINENT  PMH / PSH / FH / SH:  Past Medical, Surgical, Social, and Family History Reviewed & Updated in the EMR.   OBJECTIVE:  BP 122/84   Ht 5\' 6"  (1.676 m)   Wt 229 lb (103.9 kg)   BMI 36.96 kg/m  Physical Exam:  Vital signs are reviewed.   GEN: Alert and oriented, NAD Pulm: Breathing unlabored PSY: normal mood, congruent affect  MSK: Right foot No gross deformity, swelling, ecchymoses FROMat ankle and toes TTPmedial plantar calcaneus Negative ant drawer and talar tilt.  Negativesqueeze test Thompsons test negative. NV intact distally.  Consent was obtained for corticosteroid injection of her right plantar fascia. Consent was obtained prior to the injection. The risks were explained to the patient including risk of infection, bleeding, rupture of plantar fascia. 40 mg Depo-Medrol and 1 cc of lidocaine were injected around the plantar fascia. This is done using sterile technique. Patient tolerated the injection well there were no complications.  ASSESSMENT & PLAN:   1. Right plantar fasciitis  Successful injection as above.  Recommended she wear a compression sleeve over her foot while at work.  She can consider purchasing new orthotics as these are a  few months old.  I will leave it open-ended for her as for follow-up.  We can consider repeating an injection in the future.   Lanier Clam, DO, ATC Sports Medicine Fellow  Addendum:  I was the preceptor for this visit and available for immediate consultation.  Karlton Lemon MD Kirt Boys

## 2019-05-28 NOTE — Addendum Note (Signed)
Addended by: Jolinda Croak E on: 05/28/2019 11:50 AM   Modules accepted: Orders

## 2019-12-04 NOTE — Progress Notes (Signed)
Name: Christine Christine [name]   MRN: [id number]    DOB: 1970/03/15   Date:12/05/2019       Progress Note  Chief Complaint  Christine [name] presents with  . Menopause  . Menstrual Problem    heavy cycles, Christine [name]     Subjective:   Christine Christine [name] is a 49 y.o. female, presents to Christine [name] for Christine [name] and vaginal bleeding/menorrhagia  Christine [name] from periods  -Christine Christine [name] that she has started her period on Sunday, 11/30/2019 to today, she did not have a period for 1 week prior to this and before that had a blood for 3 weeks straight.   Last menses was beginning of September around 3 Prior to that he has a history of irregular cycles and increasingly heavier menstrual bleeding.  Her PCP discussed Christine Christine [name] with her in Christine Christine [name] and referred her to GYN.  She recently went to establish with an consult with a GYN specialist that Christine Christine [name] is in [place] -Dr. Aileen Christine [name] -office visit in July and a 2-week follow-up Records reviewed today Christine Christine [name] that they did ultrasound, and endometrial biopsy and would do nothing else for her Christine Christine Christine [name] that she is not a candidate for any treatment because she is a current smoker, she was told she was not a surgical candidate because she is a current smoker, and she Christine [name] that they told her to call and follow-up and they could discuss further but Christine [name] has not gotten a follow-up visit.  Christine Christine [name] that bleeding over Christine last month is heavy she woke up in a "puddle of blood" she Christine [name] Christine Christine [name] and cramping is severe associate with severe Christine [name] that radiates to her thighs causing her to be unable to hold still she also Christine [name] associated hot flashes sweats and palpitations. Her mother had a history of heavy menstrual bleeding and she had a hysterectomy and she has no other female relatives so she does not know any family history of when they went through menopause. Christine Christine [name] that her Christine [name] is severe, she is pacing around Christine Christine [name], appears  uncomfortable and agitated.  She has associated migraine headaches and she has not been to any of Christine specialist that were helping to manage this for her she was previously managed on Topamax, benzos and Maxalt.  She Christine [name] some specialist that would prescribe controlled substances although she does not state exactly what they were for she just says that that provider is no longer with Christine Christine [name] anymore.  She previously saw Dr. Manuella Christine [name] at [name] for migraine management but she has not seen him in over a Christine [name] as well.  She has not had Maxalt in some time and she is only taking Excedrin Migraine and over-Christine-counter medications.  She Christine [name] that she has missed a lot of work in Christine Christine [name] some Christine [name] because she was in so much severe Christine [name] she could not work 1 day she was crying, other Christine [name] he was having migraines.  Reviewed GYN records, HPI from 09/11/2019 office visit with Dr. Gunnar Christine [name], seen for abnormal perimenopausal bleeding: Here for office EMB for evaluation of abnormal perimenopausal bleeding. She had a pelvic US this morning that showed an endometrial thickness of 4 mm without any uterine structural abnormalities such as fibroids and/or polyps. She has not had 1 full Christine [name] of amenorrhea but has noted a heavier menstrual bleeding pattern in Christine last Christine [name]. She had a history of endometrial ablation in 2016 but continued to have menstrual bleeding.  LMP: Christine [name] last menstrual period was 08/20/2019 (exact date). has bleeding intervals of every 14-28 Christine [name], lasting for 5 Christine [name], flow is heavy to moderate bleeding  She also Christine [name] intermenstrual bleeding in May and June 2021.   Christine Christine [name] that she is currently soaking 1-2 pads every couple of hours.  She denies any shortness of breath, rapid heart rate, near syncope, cold intolerance.    Pt is new to me though I am her PCP due to prior PCP's leaving Christine Christine [name] - I have seen her once previously about 10 months ago for UTI complaints.   Pt  has hx of migraines - triggered by menstrual cycles   Intractable migraine with aura without status migrainosus (Primary Dx);  Chronic tension-type headache, intractable; we cannot exciting Bilateral occipital neuralgia;  Anxiety and depression;  Multiple personality disorder (CMS-HCC);  Vitamin D deficiency;  Snoring;  Vitamin B12 deficiency  Christine [name] Christine Christine [name] to work on smoking cessation but she also has a lot of stress anxiety and has been gaining weight which is very upsetting to her.  She does smoke cigarettes had recently has been able to cut down to 2 to 4 cigarettes a day some day.  She asked about smoking cessation.      Current Outpatient Medications:  .  baclofen (LIORESAL) 10 MG tablet, Take 0.5-1 tablets (5-10 mg total) by mouth 3 (three) times daily as needed for muscle spasms., Disp: 60 each, Rfl: 1 .  cloNIDine (CATAPRES) 0.1 MG tablet, Take 1 tablet (0.1 mg total) by mouth at bedtime as needed (for hot flashes/sweats)., Disp: 30 tablet, Rfl: 3 .  naproxen sodium (ALEVE) 220 MG tablet, Take 1 tablet (220 mg total) by mouth 2 (two) times daily as needed (for Christine [name])., Disp: 60 tablet, Rfl: 2 .  rizatriptan (MAXALT-MLT) 10 MG disintegrating tablet, Take 1 tablet (10 mg total) by mouth as needed for migraine. May repeat in 2 hours if needed, Disp: 10 tablet, Rfl: 2  Christine [name] Active Problem List   Diagnosis Date Noted  . Tobacco abuse 04/15/2018  . Preventative health care 02/09/2018  . Obesity (BMI 30.0-34.9) 02/09/2018  . Multiple personality disorder ([place]) 01/03/2018  . Mild episode of recurrent major depressive disorder ([place]) 01/03/2018  . Generalized anxiety disorder 01/03/2018  . Menorrhagia with regular cycle 12/10/2013  . Menorrhagia with irregular cycle 11/19/2013    Past Surgical History:  Procedure Laterality Date  . BREAST BIOPSY N/A early 2000s   benign  . Ava   x 2   . DILITATION & CURRETTAGE/HYSTROSCOPY WITH  THERMACHOICE ABLATION N/A 01/13/2014   Procedure: DILATATION & CURETTAGE/HYSTEROSCOPY WITH THERMACHOICE ABLATION;  Surgeon: Christine Kind, MD;  Location: [facility];  Christine [name]: Gynecology;  Laterality: N/A;  uteral sound is to 11cm,11ml of D5W in, 59ml out. temp:87 degree celcious total therapy time-83min, 57 sec  . TUBAL LIGATION      Family History  Problem Relation Age of Onset  . Hypertension Mother   . Heart attack Father   . Stroke Maternal Grandmother   . Heart attack Maternal Grandfather     Social History   Tobacco Use  . Smoking status: Current Every Day Smoker    Packs/day: 0.50    Years: 30.00    Pack years: 15.00    Types: Cigarettes  . Smokeless tobacco: Never Used  Vaping Use  . Vaping Use: Never used  Substance Use Topics  . Alcohol use: No  . Drug use: No  Allergies  Allergen Reactions  . Zoloft [Sertraline Hcl]     Unpleasant thoughts    Health Maintenance  Topic Date Due  . TETANUS/TDAP  Never done  . COLONOSCOPY  Never done  . INFLUENZA VACCINE  09/28/2019  . COVID-19 Vaccine (2 - Pfizer 2-dose series) 10/15/2019  . MAMMOGRAM  07/16/2020  . PAP SMEAR-Modifier  02/07/2021  . Hepatitis C Screening  Completed  . HIV Screening  Completed    Chart Review Today: I personally reviewed active problem list, medication list, allergies, family history, social history, health maintenance, notes from last encounter, lab results, imaging with Christine Christine [name]/caregiver today.  Review of Systems  10 Systems reviewed and are negative for acute change except as noted in Christine HPI.    Objective:   Vitals:   12/05/19 1355  BP: 124/82  Pulse: 92  Resp: 16  Temp: 97.8 F (36.6 C)  TempSrc: Oral  SpO2: 99%  Weight: 243 lb 12.8 oz (110.6 kg)  Height: 5\' 6"  (1.676 m)    Body mass index is 39.35 kg/m.  Physical Exam Vitals and nursing note reviewed.  Constitutional:      General: She is not in acute distress.    Appearance: She is obese. She is  diaphoretic. She is not ill-appearing or toxic-appearing.  HENT:     Head: Normocephalic and atraumatic.  Cardiovascular:     Rate and Rhythm: Normal rate and regular rhythm.     Pulses: Normal pulses.     Heart sounds: Normal heart sounds.  Pulmonary:     Effort: Pulmonary effort is normal.     Breath sounds: Normal breath sounds.  Skin:    Coloration: Skin is not jaundiced or pale.  Neurological:     Mental Status: She is alert. Mental status is at baseline.     Gait: Gait normal.  Psychiatric:        Mood and Affect: Mood is anxious. Affect is labile.        Speech: Speech is rapid and pressured.        Behavior: Behavior is agitated and hyperactive. Behavior is cooperative.         Assessment & Plan:   1. Menorrhagia with irregular cycle Worsening menses likely perimenopausal Christine [name].  Christine Christine [name] that she has seen a subspecialist and was not given any treatment and was told she is does not qualify for several management or surgical options due to her smoking history.  Near Christine end of Christine office visit today Christine Christine [name] finally reported that she was given progesterone by GYN and she took it from July up until bout a month ago and then she stopped it.  This is likely Christine cause of her increased and prolonged menses for Christine past 4 to 5 weeks.  I did encourage her to restart progesterone medication. I did reassure her that perimenopausal symptoms do lead to menopause and she may be done with cycles very soon however there is no way for me to tell her exactly when this will happen. She did request a second opinion-we will refer her to [place] for a consult Because of her reported heavy bleeding for 4 out of Christine 5 past weeks we will check a CBC but she is not having any Christine [name] concerning for symptomatic anemia She was sweating and pacing Christine Christine [name] appeared very anxious, and she did continue to rub her legs and complained of severe Christine [name], did offer muscle relaxers, NSAIDs, a  shot of Toradol here  today, we discussed options such as Effexor or clonidine try and help with Christine hot flashes and palpitations -because of her extensive psychiatric history and what is in her diagnosis list I sent in clonidine 0.1 mg dose that she could take at bedtime, and preferred to not give Effexor  - Ambulatory referral to Gynecology - CBC with Differential/Platelet  2. Christine [name] in both lower extremities Muscle Christine [name] which she Christine [name] has occurred with recent worsening of her menstrual cycle Encourage NSAIDs and protecting her stomach with over-Christine-counter medicines like Prilosec - ketorolac (TORADOL) injection 30 mg - baclofen (LIORESAL) 10 MG tablet; Take 0.5-1 tablets (5-10 mg total) by mouth 3 (three) times daily as needed for muscle spasms.  Dispense: 60 each; Refill: 1 - naproxen sodium (ALEVE) 220 MG tablet; Take 1 tablet (220 mg total) by mouth 2 (two) times daily as needed (for Christine [name]).  Dispense: 60 tablet; Refill: 2  3. Current smoker She has cut back on smoking significantly and is only smoking a few cigarettes a day. She is interested in smoking cessation but she is also very anxious about gaining weight if she is stop smoking completely.  Will reach out to [facility] staff and have them contact her and review any programs or resources locally  4. Class 2 obesity due to excess calories without serious comorbidity with body mass index (BMI) of 37.0 to 37.9 in adult She Christine [name] weight gain of about 10 pounds in Christine last couple months, she believes this is due to taking Christine progesterone from GYN.  It does appear that her weight varies and has changed in a 30 pound range over Christine Christine [name] per Christine chart  Wt Readings from Last 5 Encounters:  12/05/19 243 lb 12.8 oz (110.6 kg)  05/28/19 229 lb (103.9 kg)  04/24/19 233 lb (105.7 kg)  03/14/19 231 lb 14.4 oz (105.2 kg)  12/27/18 215 lb (97.5 kg)   BMI Readings from Last 5 Encounters:  12/05/19 39.35 kg/m  05/28/19 36.96 kg/m    04/24/19 37.61 kg/m  03/14/19 37.43 kg/m  12/27/18 34.70 kg/m   5. Migraine without status migrainosus, not intractable, unspecified migraine type Migraines triggered by menses, refill on her Maxalt -encouraged her to follow-up with [facility] neurology for further management - ketorolac (TORADOL) injection 30 mg - rizatriptan (MAXALT-MLT) 10 MG disintegrating tablet; Take 1 tablet (10 mg total) by mouth as needed for migraine. May repeat in 2 hours if needed  Dispense: 10 tablet; Refill: 2  6. Perimenopause  - cloNIDine (CATAPRES) 0.1 MG tablet; Take 1 tablet (0.1 mg total) by mouth at bedtime as needed (for hot flashes/sweats).  Dispense: 30 tablet; Refill: 3   Instructions verbally given to Christine [name] -encouraged her to restart progesterone per Dr. Gunnar Christine [name], can use muscle relaxers, Aleve, heat therapy as needed, can use clonidine at bedtime and Maxalt as prescribed for migraine headaches.  Encouraged her to protect her stomach with an over-Christine-counter PPI.    Explained that she may have more options available if she is able to quit smoking completely.  She is interested in smoking cessation but she is also afraid to stop smoking -I reviewed available medications and resources but explained to her that unless she is committed to smoking cessation she will have a difficult time with being successful.  I reassured her that Christine amount of bleeding is not concerning for heavy menstrual bleeding per GYN or ER standards-reviewed that if she is soaking a tampon or pad every 1/2 hour that she  should go to Christine ER for evaluation. I believe if she restarts her progesterone this will likely improve and explained to her why this may be happening with how she discontinued Christine medications prescribed to her by GYN.  We did briefly discuss Chantix for smoking cessation however I explained that she would need to have very stable and well-controlled mood and psychiatric diagnoses which she Christine [name] that she does  however she appears very anxious and hyperactive and Christine diagnoses on her chart are concerning enough that I told her I would prefer for her to see a specialist or have a psychiatrist evaluate and clear her prior to trying Chantix.  Smoking cessation instruction/counseling given:  counseled Christine [name] on Christine dangers of tobacco use, advised Christine [name] to stop smoking, and reviewed strategies to maximize success   Delsa Grana, PA-C 12/05/19 4:54 PM

## 2019-12-05 ENCOUNTER — Ambulatory Visit: Payer: Managed Care, Other (non HMO) | Admitting: Internal Medicine

## 2019-12-05 ENCOUNTER — Encounter: Payer: Self-pay | Admitting: Family Medicine

## 2019-12-05 ENCOUNTER — Ambulatory Visit (INDEPENDENT_AMBULATORY_CARE_PROVIDER_SITE_OTHER): Payer: 59 | Admitting: Family Medicine

## 2019-12-05 ENCOUNTER — Other Ambulatory Visit: Payer: Self-pay

## 2019-12-05 VITALS — BP 124/82 | HR 92 | Temp 97.8°F | Resp 16 | Ht 66.0 in | Wt 243.8 lb

## 2019-12-05 DIAGNOSIS — N921 Excessive and frequent menstruation with irregular cycle: Secondary | ICD-10-CM

## 2019-12-05 DIAGNOSIS — F17200 Nicotine dependence, unspecified, uncomplicated: Secondary | ICD-10-CM

## 2019-12-05 DIAGNOSIS — G43909 Migraine, unspecified, not intractable, without status migrainosus: Secondary | ICD-10-CM | POA: Diagnosis not present

## 2019-12-05 DIAGNOSIS — M79605 Pain in left leg: Secondary | ICD-10-CM

## 2019-12-05 DIAGNOSIS — M79604 Pain in right leg: Secondary | ICD-10-CM

## 2019-12-05 DIAGNOSIS — E6609 Other obesity due to excess calories: Secondary | ICD-10-CM | POA: Diagnosis not present

## 2019-12-05 DIAGNOSIS — Z6837 Body mass index (BMI) 37.0-37.9, adult: Secondary | ICD-10-CM

## 2019-12-05 DIAGNOSIS — N951 Menopausal and female climacteric states: Secondary | ICD-10-CM

## 2019-12-05 LAB — CBC WITH DIFFERENTIAL/PLATELET
Absolute Monocytes: 911 cells/uL (ref 200–950)
Basophils Absolute: 97 cells/uL (ref 0–200)
Basophils Relative: 0.7 %
Eosinophils Absolute: 331 cells/uL (ref 15–500)
Eosinophils Relative: 2.4 %
HCT: 41.9 % (ref 35.0–45.0)
Hemoglobin: 13.5 g/dL (ref 11.7–15.5)
Lymphs Abs: 3050 cells/uL (ref 850–3900)
MCH: 29.2 pg (ref 27.0–33.0)
MCHC: 32.2 g/dL (ref 32.0–36.0)
MCV: 90.7 fL (ref 80.0–100.0)
MPV: 12.1 fL (ref 7.5–12.5)
Monocytes Relative: 6.6 %
Neutro Abs: 9412 cells/uL — ABNORMAL HIGH (ref 1500–7800)
Neutrophils Relative %: 68.2 %
Platelets: 286 10*3/uL (ref 140–400)
RBC: 4.62 10*6/uL (ref 3.80–5.10)
RDW: 12.4 % (ref 11.0–15.0)
Total Lymphocyte: 22.1 %
WBC: 13.8 10*3/uL — ABNORMAL HIGH (ref 3.8–10.8)

## 2019-12-05 MED ORDER — CLONIDINE HCL 0.1 MG PO TABS: 0.1000 mg | ORAL_TABLET | Freq: Every evening | ORAL | 3 refills | Status: DC | PRN

## 2019-12-05 MED ORDER — NAPROXEN SODIUM 220 MG PO TABS: 220.0000 mg | ORAL_TABLET | Freq: Two times a day (BID) | ORAL | 2 refills | Status: DC | PRN

## 2019-12-05 MED ORDER — BACLOFEN 10 MG PO TABS: 5.0000 mg | ORAL_TABLET | Freq: Three times a day (TID) | ORAL | 1 refills | Status: DC | PRN

## 2019-12-05 MED ORDER — KETOROLAC TROMETHAMINE 60 MG/2ML IM SOLN
30.0000 mg | Freq: Once | INTRAMUSCULAR | Status: AC
  Administered 2019-12-05: 30 mg via INTRAMUSCULAR

## 2019-12-05 MED ORDER — RIZATRIPTAN BENZOATE 10 MG PO TBDP: 10.0000 mg | ORAL_TABLET | ORAL | 2 refills | Status: DC | PRN

## 2019-12-17 ENCOUNTER — Encounter: Payer: Self-pay | Admitting: *Deleted

## 2019-12-26 ENCOUNTER — Other Ambulatory Visit: Payer: Self-pay

## 2019-12-26 ENCOUNTER — Encounter: Payer: Self-pay | Admitting: Obstetrics and Gynecology

## 2019-12-26 ENCOUNTER — Ambulatory Visit (INDEPENDENT_AMBULATORY_CARE_PROVIDER_SITE_OTHER): Payer: 59 | Admitting: Obstetrics and Gynecology

## 2019-12-26 VITALS — BP 128/88 | Ht 67.0 in | Wt 245.0 lb

## 2019-12-26 DIAGNOSIS — Z1239 Encounter for other screening for malignant neoplasm of breast: Secondary | ICD-10-CM | POA: Diagnosis not present

## 2019-12-26 DIAGNOSIS — N939 Abnormal uterine and vaginal bleeding, unspecified: Secondary | ICD-10-CM | POA: Diagnosis not present

## 2019-12-26 NOTE — Progress Notes (Signed)
Gynecology Abnormal Uterine Bleeding Initial Evaluation   Chief Complaint:  Chief Complaint  Patient presents with  . Gynecologic Exam    irregular cycles, pain, menorrhagia     History of Present Illness:    Paitient is a 49 y.o. No obstetric history on file. who LMP was Patient's last menstrual period was 11/28/2019 (approximate)., presents today for a problem visit.  She complains of menometrorrhagia that  began several years ago and its severity is described as severe.  The patient menstrual complaints are chronic present for the past 6 months. She has irregular periods from at irregular intervals with severe cramping.  The patient is sexually active. She currently uses oral progesterone-only contraceptivefor contraception.  Last Pap results esults were obtained 02/07/2018 NIL and HR HPV negative .  Bleeding has continued and been unresponsive to prometrium 200mg  po daily.   Previous evaluation:  Pap 02/07/2018 NILM HPV negative  Endometrial biopsy 09/11/2019 Benign endometrial fragments with focal stromal breakdown and features suggestive of polyp. Negative for atypia, endometritis, and malignancy  TVUS 09/11/2019 EMS of 38mm, no focal masses, no evidence of fibroids, normal right ovary, non-visualized left ovary Previous Treatment: Endometrial ablation  CS x 2, prior ablation  LMP: Patient's last menstrual period was 11/28/2019 (approximate). Shortest Interval: 1 week  Longest Interval: 3 weeks Duration of flow: 3 days up to 3 weeks days Postcoital Bleeding: occasional  Dysmenorrhea: severe dysmenorrhea  Paramter Normal / Abnormal Prsent  Frequency [place]     Infrequent (>38 days)     Normal (?24 days ?38 days)     Freequent (<24 days) X  Duration Normal (?8 days)     Prolonged (>8 days) X  Regularity Regular (shortest to longest cycle variation ?7-9 days)*     Irregular (shortest to longest cycle variation ?8-10days)* X  Flow Volume Light    (Self reported) Normal      Heavy X      Intermenstrual Bleeding None     Random X   Cyclical early     Cyclical mid     Cyclical late        Unscheduled Bleeding  Not applicable    (exogenous hormones) Absent     Present X   FIGO AUB I System: *The available evidence suggests that, using these criteria, the normal range (shortest to longest) varies with age: 50-25 y of age, ?49 d; 28-41 y, ?7 d; and for 76-45 y, ?9 d    Review of Systems: ROS  Past Medical History:  Patient Active Problem List   Diagnosis Date Noted  . Tobacco abuse 04/15/2018  . Preventative health care 02/09/2018  . Obesity (BMI 30.0-34.9) 02/09/2018  . Multiple personality disorder ([place]) 01/03/2018  . Mild episode of recurrent major depressive disorder ([place]) 01/03/2018  . Generalized anxiety disorder 01/03/2018  . Menorrhagia with regular cycle 12/10/2013  . Menorrhagia with irregular cycle 11/19/2013    Past Surgical History:  Past Surgical History:  Procedure Laterality Date  . BREAST BIOPSY N/A early 2000s   benign  . [place]   x 2   . DILITATION & CURRETTAGE/HYSTROSCOPY WITH THERMACHOICE ABLATION N/A 01/13/2014   Procedure: DILATATION & CURETTAGE/HYSTEROSCOPY WITH THERMACHOICE ABLATION;  Surgeon: [name], MD;  Location: [facility];  Service: Gynecology;  Laterality: N/A;  uteral sound is to 11cm,68ml of D5W in, 33ml out. temp:87 degree celcious total therapy time-58min, 57 sec  . TUBAL LIGATION      Obstetric  History: No obstetric history on file.  Family History:  Family History  Problem Relation Age of Onset  . Hypertension Mother   . Heart attack Father   . Stroke Maternal Grandmother   . Heart attack Maternal Grandfather     Social History:  Social History   Socioeconomic History  . Marital status: Divorced    Spouse name: Not on file  . Number of children: 3  . Years of education: 43  . Highest education level: Some college, no degree  Occupational History  . Occupation:  [employer]  Tobacco Use  . Smoking status: Current Every Day Smoker    Packs/day: 0.25    Years: 30.00    Pack years: 7.50    Types: Cigarettes  . Smokeless tobacco: Never Used  . Tobacco comment: Heavy smoker for most of her years of smoking  Vaping Use  . Vaping Use: Never used  Substance and Sexual Activity  . Alcohol use: No  . Drug use: No  . Sexual activity: Yes    Birth control/protection: Surgical  Other Topics Concern  . 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 Difficulty of Paying Living Expenses: Not on file  Food Insecurity:   . Worried About Charity fundraiser in the Last Year: Not on file  . Ran Out of Food in the Last Year: Not on file  Transportation Needs:   . Lack of Transportation (Medical): Not on file  . Lack of Transportation (Non-Medical): Not on file  Physical Activity:   . Days of Exercise per Week: Not on file  . Minutes of Exercise per Session: Not on file  Stress:   . Feeling of Stress : Not on file  Social Connections:   . Frequency of Communication with Friends and Family: Not on file  . Frequency of Social Gatherings with Friends and Family: Not on file  . Attends Religious Services: Not on file  . Active Member of Clubs or Organizations: Not on file  . Attends Archivist Meetings: Not on file  . Marital Status: Not on file  Intimate Partner Violence:   . Fear of Current or Ex-Partner: Not on file  . Emotionally Abused: Not on file  . Physically Abused: Not on file  . Sexually Abused: Not on file    Allergies:  Allergies  Allergen Reactions  . Zoloft [Sertraline Hcl]     Unpleasant thoughts    Medications: Prior to Admission medications   Medication Sig Start Date End Date Taking? Authorizing Provider  baclofen (LIORESAL) 10 MG tablet Take 0.5-1 tablets (5-10 mg total) by mouth 3 (three) times daily as needed for muscle  spasms. 12/05/19   Delsa Grana, PA-C  cloNIDine (CATAPRES) 0.1 MG tablet Take 1 tablet (0.1 mg total) by mouth at bedtime as needed (for hot flashes/sweats). 12/05/19   Delsa Grana, PA-C  naproxen sodium (ALEVE) 220 MG tablet Take 1 tablet (220 mg total) by mouth 2 (two) times daily as needed (for pain). 12/05/19   Delsa Grana, PA-C  rizatriptan (MAXALT-MLT) 10 MG disintegrating tablet Take 1 tablet (10 mg total) by mouth as needed for migraine. May repeat in 2 hours if needed 12/05/19   Delsa Grana, PA-C    Physical Exam Blood pressure 128/88, height 5\' 7"  (1.702 m), weight 245 lb (111.1 kg), last menstrual period 11/28/2019. Body mass index  is 38.37 kg/m.   Patient's last menstrual period was 11/28/2019 (approximate).  General: NAD, well nourished appears stated age 56: normocephalic, anicteric Pulmonary: No increased work of breathing Abdomen: soft, non-tender, non-distended.  Umbilicus without lesions.  No hepatomegaly, splenomegaly or masses palpable. No evidence of hernia  Genitourinary:  External: Normal external female genitalia.  Normal urethral meatus, normal Bartholin's and Skene's glands.    Vagina: Normal vaginal mucosa, no evidence of prolapse.    Cervix: Grossly normal in appearance, no bleeding  Uterus: Non-enlarged, mobile, normal contour.  No CMT  Adnexa: ovaries non-enlarged, no adnexal masses  Rectal: deferred  Lymphatic: no evidence of inguinal lymphadenopathy Extremities: no edema, erythema, or tenderness Neurologic: Grossly intact Psychiatric: mood appropriate, affect full  Female chaperone present for pelvic portions of the physical exam  Assessment: 49 y.o. No obstetric history on file. with abnormal uterine bleeding  Plan: Problem List Items Addressed This Visit    None    Visit Diagnoses    Abnormal uterine bleeding    -  Primary   Relevant Orders   CBC   FSH   Estradiol   Thyroid Panel With TSH   Breast screening       Relevant Orders   MM  3D SCREEN BREAST BILATERAL      1) Discussed management options for abnormal uterine bleeding including expectant, NSAIDs, tranexamic acid (Lysteda), oral progesterone (Provera, norethindrone, megace), Depo Provera, Levonorgestrel containing IUD, endometrial ablation (Novasure) or hysterectomy as definitive surgical management.  Discussed risks and benefits of each method.   The patient has started a basic PALM-COIEN classification system work up for structural lesions, will obtain labs today.  In the meantime the continue prometrium.  The role of unopposed estrogen in the development of endometrial hyperplasia or carcinoma is discussed.  The risk of endometrial hyperplasia is linearly correlated with increasing BMI given the production of estrone by adipose tissue.  Bleeding precautions reviewed.  - Post TLH, BS, cystoscopy - Patient opts for definitive surgical management via hysterectomy. The risks of surgery were discussed in detail with the patient including but not limited to: bleeding which may require transfusion or reoperation; infection which may require antibiotics; injury to bowel, bladder, ureters or other surrounding organs (With a literature reported rate of urinary tract injury of 1% quoted); need for additional procedures including laparotomy; thromboembolic phenomenon, incisional problems and other postoperative/anesthesia complications.  Patient was also advised that recovery procedure generally involves an overnight stay; and the  expected recovery time after a hysterectomy being in the range of 6-8 weeks.  Likelihood of success in alleviating the patient's symptoms was discussed.  While definitive in regards to issues with menstural bleeding, pelvic pain if present preoperatively may continue and in fact worsen postoperatively.  She is aware that the procedure will render her unable to pursue childbearing in the future.   She was told that she will be contacted by our surgical scheduler  regarding the time and date of her surgery; routine preoperative instructions of having nothing to eat or drink after midnight on the day prior to surgery and also coming to the hospital 1.5 hours prior to her time of surgery were also emphasized.  She was told she may be called for a preoperative appointment about a week prior to surgery and will be given further preoperative instructions at that visit.  Routine postoperative instructions will be reviewed with the patient and her family in detail after surgery. Printed patient education handouts about the procedure was given  to the patient to review at home.   2) A total of 30 minutes were spent in face-to-face contact with the patient during this encounter with over half of that time devoted to counseling and coordination of care.   3) Mammogram ordered  4) Return in 2 weeks (on 01/09/2020), or if symptoms worsen or fail to improve.   Malachy Mood, MD, [facility], [facility] 12/26/2019, 1:24 PM

## 2019-12-27 LAB — THYROID PANEL WITH TSH
Free Thyroxine Index: 1.6 (ref 1.2–4.9)
T3 Uptake Ratio: 22 % — ABNORMAL LOW (ref 24–39)
T4, Total: 7.1 ug/dL (ref 4.5–12.0)
TSH: 2.45 u[IU]/mL (ref 0.450–4.500)

## 2019-12-27 LAB — CBC
Hematocrit: 41.8 % (ref 34.0–46.6)
Hemoglobin: 13.9 g/dL (ref 11.1–15.9)
MCH: 29.5 pg (ref 26.6–33.0)
MCHC: 33.3 g/dL (ref 31.5–35.7)
MCV: 89 fL (ref 79–97)
Platelets: 302 10*3/uL (ref 150–450)
RBC: 4.71 x10E6/uL (ref 3.77–5.28)
RDW: 12.2 % (ref 11.7–15.4)
WBC: 12.3 10*3/uL — ABNORMAL HIGH (ref 3.4–10.8)

## 2019-12-27 LAB — ESTRADIOL: Estradiol: 498 pg/mL

## 2019-12-27 LAB — FOLLICLE STIMULATING HORMONE: FSH: 10.1 m[IU]/mL

## 2019-12-31 ENCOUNTER — Telehealth: Payer: Self-pay | Admitting: Obstetrics and Gynecology

## 2019-12-31 NOTE — Telephone Encounter (Signed)
Called patient to schedule TLH/BS, Cystoscopy w Georgianne Fick  DOS 12/7  H&P 12/3 @ 9:50    Covid testing 12/3 @ 8-10:30, [address], drive up and wear mask. Advised pt to quarantine until DOS.  Pre-admit phone call appointment to be requested - date and time will be included on H&P paper work. Also all appointments will be updated on pt MyChart. Explained that this appointment has a call window. Based on the time scheduled will indicate if the call will be received within a 4 hour window before 1:00 or after.  Advised that pt may also receive calls from the hospital pharmacy and pre-service center.  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 No secondary insurance.

## 2019-12-31 NOTE — Telephone Encounter (Signed)
-----   Message from Malachy Mood, MD sent at 12/26/2019 10:29 PM EDT ----- Regarding: Surgery Surgery Booking Request Patient Full Name:  Christine [name]  MRN: [id number]  DOB: 1970-08-18  Surgeon: Malachy Mood, MD  Requested Surgery Date and Time: 3-6 weeks Primary Diagnosis AND Code: AUB Secondary Diagnosis and Code:  Surgical Procedure: TLH, BS, cystoscopy RNFA Requested?: No L&D Notification: No Admission Status: observation Length of Surgery: 125 min Special Case Needs: No H&P: Yes Phone Interview???:  Yes Interpreter: No Medical Clearance:  No Special Scheduling Instructions: No Any known health/anesthesia issues, diabetes, sleep apnea, latex allergy, defibrillator/pacemaker?: No Acuity: P2   (P1 highest, P2 delay may cause harm, P3 low, elective gyn, P4 lowest)

## 2020-01-09 ENCOUNTER — Ambulatory Visit (INDEPENDENT_AMBULATORY_CARE_PROVIDER_SITE_OTHER): Payer: 59 | Admitting: Family Medicine

## 2020-01-09 ENCOUNTER — Other Ambulatory Visit: Payer: Self-pay

## 2020-01-09 ENCOUNTER — Encounter: Payer: Self-pay | Admitting: Family Medicine

## 2020-01-09 VITALS — BP 124/78 | HR 90 | Temp 98.2°F | Resp 16 | Ht 67.0 in | Wt 248.8 lb

## 2020-01-09 DIAGNOSIS — G43909 Migraine, unspecified, not intractable, without status migrainosus: Secondary | ICD-10-CM | POA: Diagnosis not present

## 2020-01-09 DIAGNOSIS — Z0289 Encounter for other administrative examinations: Secondary | ICD-10-CM | POA: Diagnosis not present

## 2020-01-09 DIAGNOSIS — J329 Chronic sinusitis, unspecified: Secondary | ICD-10-CM

## 2020-01-09 DIAGNOSIS — R300 Dysuria: Secondary | ICD-10-CM | POA: Diagnosis not present

## 2020-01-09 DIAGNOSIS — J310 Chronic rhinitis: Secondary | ICD-10-CM

## 2020-01-09 LAB — POCT URINALYSIS DIPSTICK
Bilirubin, UA: NEGATIVE
Glucose, UA: NEGATIVE
Ketones, UA: NEGATIVE
Nitrite, UA: NEGATIVE
Protein, UA: POSITIVE — AB
Spec Grav, UA: 1.02 (ref 1.010–1.025)
Urobilinogen, UA: 0.2 E.U./dL
pH, UA: 5 (ref 5.0–8.0)

## 2020-01-09 MED ORDER — PROMETHAZINE HCL 25 MG RE SUPP: 25.0000 mg | Freq: Four times a day (QID) | RECTAL | 1 refills | Status: DC | PRN

## 2020-01-09 MED ORDER — SUMATRIPTAN SUCCINATE 6 MG/0.5ML ~~LOC~~ SOAJ: SUBCUTANEOUS | 1 refills | Status: DC

## 2020-01-09 MED ORDER — CEFDINIR 300 MG PO CAPS: 300.0000 mg | ORAL_CAPSULE | Freq: Two times a day (BID) | ORAL | 0 refills | Status: AC

## 2020-01-09 NOTE — Patient Instructions (Signed)
I will look up imitrex injectable dosing and send in that, plus phenergan and an antibiotic to cover sinuses and possible UTI  You should hear about following up with Dr. Manuella Ghazi

## 2020-01-09 NOTE — Progress Notes (Signed)
Name: Christine [name]   MRN: [id number]    DOB: 02-24-1971   Date:01/09/2020       Progress Note  Chief Complaint  Patient presents with  . Consult    FMLA paperwork  . Migraine     Subjective:   Christine [name] is a 49 y.o. female, presents to clinic for [facility] for migraines  HA's worse for several months, change in quality, worse with menorrhagia, used to come with menses but with worsening perimenopausal sx HA's much worse (she is pending hysterectomy with Dr. Georgianne Fick next month)  Migraines in the past tx with maxalt and imitrex injections and with prn phenergan in the past sharp intermittent HA's, used to see Dr. Greggory Keen neurology Recently has been more daily, dull, most recently located to forehead Associated N, V, dizziness, photophobia and phonophobia Various triggers - smells, foods, menses  States in the past had procedures in nose, failed Topamax, maxalt Denies past imaging  FMLA paperwork done Last Saw dr Manuella Ghazi or a PCP for migraines in 2019 - reviewed today - he prescribed Ajovy, Maxalt and other imaging was not approved by her insurance and there was no follow-up   last OV here, her meds were refilled, but multiple other acute complaints were addressed, and prior to that she saw Dr. Sanda Klein in 2019 for migraines  She also has urinary frequency and dysuria - would like to have her urine checked for UTI  She has had teeth checked, vision checked  She is a CNA on med/surg floor at [employer]    Current Outpatient Medications:  .  baclofen (LIORESAL) 10 MG tablet, Take 0.5-1 tablets (5-10 mg total) by mouth 3 (three) times daily as needed for muscle spasms., Disp: 60 each, Rfl: 1 .  buPROPion (WELLBUTRIN XL) 150 MG 24 hr tablet, Take by mouth., Disp: , Rfl:  .  cloNIDine (CATAPRES) 0.1 MG tablet, Take 1 tablet (0.1 mg total) by mouth at bedtime as needed (for hot flashes/sweats)., Disp: 30 tablet, Rfl: 3 .  naproxen sodium (ALEVE) 220 MG tablet, Take 1  tablet (220 mg total) by mouth 2 (two) times daily as needed (for pain)., Disp: 60 tablet, Rfl: 2 .  progesterone (PROMETRIUM) 200 MG capsule, Take by mouth., Disp: , Rfl:  .  rizatriptan (MAXALT-MLT) 10 MG disintegrating tablet, Take 1 tablet (10 mg total) by mouth as needed for migraine. May repeat in 2 hours if needed, Disp: 10 tablet, Rfl: 2  Patient Active Problem List   Diagnosis Date Noted  . Tobacco abuse 04/15/2018  . Preventative health care 02/09/2018  . Obesity (BMI 30.0-34.9) 02/09/2018  . Multiple personality disorder ([place]) 01/03/2018  . Mild episode of recurrent major depressive disorder ([place]) 01/03/2018  . Generalized anxiety disorder 01/03/2018  . Menorrhagia with regular cycle 12/10/2013  . Menorrhagia with irregular cycle 11/19/2013    Past Surgical History:  Procedure Laterality Date  . BREAST BIOPSY N/A early 2000s   benign  . [name]   x 2   . DILITATION & CURRETTAGE/HYSTROSCOPY WITH THERMACHOICE ABLATION N/A 01/13/2014   Procedure: DILATATION & CURETTAGE/HYSTEROSCOPY WITH THERMACHOICE ABLATION;  Surgeon: [name], MD;  Location: [facility];  Service: Gynecology;  Laterality: N/A;  uteral sound is to 11cm,62ml of D5W in, 74ml out. temp:87 degree celcious total therapy time-38min, 57 sec  . TUBAL LIGATION      Family History  Problem Relation Age of Onset  . Hypertension Mother   . Heart attack Father   .  Stroke Maternal Grandmother   . Heart attack Maternal Grandfather     Social History   Tobacco Use  . Smoking status: Current Every Day Smoker    Packs/day: 0.25    Years: 30.00    Pack years: 7.50    Types: Cigarettes  . Smokeless tobacco: Never Used  . Tobacco comment: Heavy smoker for most of her years of smoking  Vaping Use  . Vaping Use: Never used  Substance Use Topics  . Alcohol use: No  . Drug use: No     Allergies  Allergen Reactions  . Zoloft [Sertraline Hcl]     Unpleasant thoughts    Health  Maintenance  Topic Date Due  . TETANUS/TDAP  Never done  . COLONOSCOPY  Never done  . MAMMOGRAM  07/16/2020  . PAP SMEAR-Modifier  02/07/2021  . INFLUENZA VACCINE  Completed  . COVID-19 Vaccine  Completed  . Hepatitis C Screening  Completed  . HIV Screening  Completed    Chart Review Today: I personally reviewed active problem list, medication list, allergies, family history, social history, health maintenance, notes from last encounter, lab results, imaging with the patient/caregiver today.   Review of Systems  10 Systems reviewed and are negative for acute change except as noted in the HPI.    Objective:   Vitals:   01/09/20 1318  BP: 124/78  Pulse: 90  Resp: 16  Temp: 98.2 F (36.8 C)  TempSrc: Oral  SpO2: 99%  Weight: 248 lb 12.8 oz (112.9 kg)  Height: 5\' 7"  (1.702 m)    Body mass index is 38.97 kg/m.  Physical Exam Vitals and nursing note reviewed.  Constitutional:      General: She is not in acute distress.    Appearance: She is well-developed. She is obese. She is not ill-appearing, toxic-appearing or diaphoretic.  HENT:     Head: Normocephalic and atraumatic. No right periorbital erythema or left periorbital erythema.     Jaw: There is normal jaw occlusion.     Salivary Glands: Right salivary gland is not diffusely enlarged or tender. Left salivary gland is not diffusely enlarged or tender.     Right Ear: Tympanic membrane, ear canal and external ear normal.     Left Ear: Tympanic membrane, ear canal and external ear normal.     Nose: Mucosal edema, congestion and rhinorrhea present. Rhinorrhea is purulent.     Right Sinus: No maxillary sinus tenderness.     Left Sinus: No maxillary sinus tenderness.     Mouth/Throat:     Mouth: Mucous membranes are moist. Mucous membranes are not pale.     Pharynx: Oropharynx is clear. Uvula midline. No pharyngeal swelling, oropharyngeal exudate, posterior oropharyngeal erythema or uvula swelling.     Tonsils: No  tonsillar exudate or tonsillar abscesses.  Eyes:     General: Lids are normal. No allergic shiner.       Right eye: No discharge.        Left eye: No discharge.     Extraocular Movements: Extraocular movements intact.     Right eye: Normal extraocular motion and no nystagmus.     Left eye: Normal extraocular motion and no nystagmus.     Conjunctiva/sclera: Conjunctivae normal.     Pupils: Pupils are equal, round, and reactive to light.  Neck:     Trachea: No tracheal deviation.  Cardiovascular:     Rate and Rhythm: Normal rate and regular rhythm.     Heart sounds: Normal heart  sounds.  Pulmonary:     Effort: Pulmonary effort is normal. No respiratory distress.     Breath sounds: No stridor. No wheezing, rhonchi or rales.  Abdominal:     General: Bowel sounds are normal. There is no distension.     Palpations: Abdomen is soft.  Musculoskeletal:        General: Normal range of motion.     Cervical back: Normal range of motion and neck supple.  Skin:    General: Skin is warm and dry.     Coloration: Skin is not pale.     Findings: No rash.  Neurological:     General: No focal deficit present.     Mental Status: She is alert.     Cranial Nerves: Cranial nerves are intact. No cranial nerve deficit, dysarthria or facial asymmetry.     Sensory: Sensation is intact.     Motor: Motor function is intact. No weakness, tremor or abnormal muscle tone.     Coordination: Coordination is intact. Coordination normal.     Gait: Gait is intact. Gait normal.  Psychiatric:        Behavior: Behavior normal.         Assessment & Plan:     ICD-10-CM   1. Migraine without status migrainosus, not intractable, unspecified migraine type  G43.909 promethazine (PHENERGAN) 25 MG suppository    SUMAtriptan 6 MG/0.5ML SOAJ   worsening HA's, tx sinuses, f/up with neurology, FMLA paperwork done today  2. Rhinosinusitis  J31.0 cefdinir (OMNICEF) 300 MG capsule   J32.9    very erythematous and  inflammed appearing nasal mucosa, HA's coming often across forehead for several weeks tx for bacterial sinusitis  3. Dysuria  R30.0 POCT Urinalysis Dipstick    Urine Culture    cefdinir (OMNICEF) 300 MG capsule   abx chosen to cover urinary and sinus microbes  4. Encounter for completion of form with patient  Z02.89    FLMA completed today      Return for 3 months for migraines if not into see Dr. Manuella Ghazi.   Delsa Grana, PA-C 01/09/20 2:03 PM

## 2020-01-09 NOTE — Progress Notes (Signed)
po

## 2020-01-11 LAB — URINE CULTURE
MICRO NUMBER:: 11197728
SPECIMEN QUALITY:: ADEQUATE

## 2020-01-12 ENCOUNTER — Other Ambulatory Visit: Payer: Self-pay | Admitting: Family Medicine

## 2020-01-12 MED ORDER — SULFAMETHOXAZOLE-TRIMETHOPRIM 800-160 MG PO TABS: 1.0000 | ORAL_TABLET | Freq: Two times a day (BID) | ORAL | 0 refills | Status: AC

## 2020-01-29 ENCOUNTER — Other Ambulatory Visit: Payer: Self-pay | Admitting: Family Medicine

## 2020-01-29 DIAGNOSIS — M79604 Pain in right leg: Secondary | ICD-10-CM

## 2020-01-29 DIAGNOSIS — N921 Excessive and frequent menstruation with irregular cycle: Secondary | ICD-10-CM

## 2020-01-29 DIAGNOSIS — M79605 Pain in left leg: Secondary | ICD-10-CM

## 2020-01-30 ENCOUNTER — Other Ambulatory Visit: Payer: Self-pay

## 2020-01-30 ENCOUNTER — Encounter
Admission: RE | Admit: 2020-01-30 | Discharge: 2020-01-30 | Disposition: A | Discharge status: Home / Self Care | Payer: 59 | Source: Ambulatory Visit | Attending: Obstetrics and Gynecology | Admitting: Obstetrics and Gynecology

## 2020-01-30 ENCOUNTER — Ambulatory Visit (INDEPENDENT_AMBULATORY_CARE_PROVIDER_SITE_OTHER): Payer: 59 | Admitting: Obstetrics and Gynecology

## 2020-01-30 ENCOUNTER — Encounter: Payer: Self-pay | Admitting: Obstetrics and Gynecology

## 2020-01-30 VITALS — BP 126/82 | Ht 66.0 in | Wt 247.0 lb

## 2020-01-30 DIAGNOSIS — Z01818 Encounter for other preprocedural examination: Secondary | ICD-10-CM | POA: Diagnosis not present

## 2020-01-30 HISTORY — DX: Other specified postprocedural states: Z98.890

## 2020-01-30 HISTORY — DX: Nausea with vomiting, unspecified: R11.2

## 2020-01-30 HISTORY — DX: Gastro-esophageal reflux disease without esophagitis: K21.9

## 2020-01-30 HISTORY — DX: Other complications of anesthesia, initial encounter: T88.59XA

## 2020-01-30 HISTORY — DX: Personal history of urinary calculi: Z87.442

## 2020-01-30 NOTE — Progress Notes (Signed)
RM 4

## 2020-01-30 NOTE — Patient Instructions (Signed)
Your procedure is scheduled on: Tuesday 02/03/20.  Report to THE FIRST FLOOR REGISTRATION DESK IN THE MEDICAL MALL ON THE MORNING OF SURGERY FIRST, THEN YOU WILL CHECK IN AT THE SURGERY INFORMATION DESK LOCATED OUTSIDE THE SAME DAY SURGERY DEPARTMENT LOCATED ON 2ND FLOOR MEDICAL MALL ENTRANCE.  To find out your arrival time please call [phone] between 1PM - 3PM on Monday 02/02/20.   Remember: Instructions that are not followed completely may result in serious medical risk, up to and including death, or upon the discretion of your surgeon and anesthesiologist your surgery may need to be rescheduled.     __X__ 1. Do not eat food after midnight the night before your procedure.                 No gum chewing or hard candies. You may drink clear liquids up to 2 hours                 before you are scheduled to arrive for your surgery- DO NOT drink clear                 liquids within 2 hours of the start of your surgery.                 Clear Liquids include:  water, apple juice without pulp, clear carbohydrate                 drink such as Clearfast or Gatorade, Black Coffee or Tea (Do not add                 milk or creamer to coffee or tea).  Dr. Georgianne Fick wants you to finish your Clear Pre-Surgery Ensure 2 hours prior to your arrival time on the morning of your surgery.  __X__2.  On the morning of surgery brush your teeth with toothpaste and water, you may rinse your mouth with mouthwash if you wish.  Do not swallow any toothpaste or mouthwash.    __X__ 3.  No Alcohol for 24 hours before or after surgery.  __X__ 4.  Do Not Smoke or use e-cigarettes For 24 Hours Prior to Your Surgery.                 Do not use any chewable tobacco products for at least 6 hours prior to                 surgery.  __X__5.  Notify your doctor if there is any change in your medical condition      (cold, fever, infections).      Do NOT wear jewelry, make-up, hairpins, clips or nail polish. Do NOT wear  lotions, powders, or perfumes.  Do NOT shave 48 hours prior to surgery. Men may shave face and neck. Do NOT bring valuables to the hospital.     [facility] is not responsible for any belongings or valuables.   Contacts, dentures/partials or body piercings may not be worn into surgery. Bring a case for your contacts, glasses or hearing aids, a denture cup will be supplied.  Leave your suitcase in the car. After surgery it may be brought to your room.   For patients admitted to the hospital, discharge time is determined by your treatment team.    __X__ Take these medicines the morning of surgery with A SIP OF WATER:     1. baclofen (LIORESAL) if needed  2. promethazine (PHENERGAN) if needed  3. rizatriptan (MAXALT-MLT) if  needed  4. SUMAtriptan if needed     __X__ Use CHG Soap as directed  __X__ Stop Anti-inflammatories 7 days before surgery such as Advil, Ibuprofen, Motrin, BC or Goodies Powder, Naprosyn, Naproxen, Aleve, Aspirin, Meloxicam. May take Tylenol if needed for pain or discomfort.   __X__Do not start taking any new herbal supplements or vitamins prior to your procedure.     Wear comfortable clothing (specific to your surgery type) to the hospital.  Plan for stool softeners for home use; pain medications have a tendency to cause constipation. You can also help prevent constipation by eating foods high in fiber such as fruits and vegetables and drinking plenty of fluids as your diet allows.  After surgery, you can prevent lung complications by doing breathing exercises.Take deep breaths and cough every 1-2 hours. Your doctor may order a device called an Incentive Spirometer to help you take deep breaths.  Please call the [facility] Department at [phone] if you have any questions about these instructions.

## 2020-01-30 NOTE — Progress Notes (Signed)
Obstetrics & Gynecology Surgery H&P    Chief Complaint: Scheduled Surgery   History of Present Illness: Patient is a 49 y.o. No obstetric history on file. presenting for scheduled TLH, BS, cyst, for the treatment or further evaluation of AUB.   Prior Treatments prior to proceeding with surgery include: endometrial ablation       Pap 02/07/2018 NILM HPV negative             Endometrial biopsy 09/11/2019 Benign endometrial fragments with focal stromal breakdown and features suggestive of polyp. Negative for atypia, endometritis, and malignancy             TVUS 09/11/2019 EMS of 53mm, no focal masses, no evidence of fibroids, normal right ovary, non-visualized left ovary Previous Treatment: Endometrial ablation   Review of Systems:10 point review of systems  Past Medical History:  Patient Active Problem List   Diagnosis Date Noted  . Migraine without status migrainosus, not intractable 01/09/2020  . Tobacco abuse 04/15/2018  . Preventative health care 02/09/2018  . Obesity (BMI 30.0-34.9) 02/09/2018  . Multiple personality disorder ([place]) 01/03/2018  . Mild episode of recurrent major depressive disorder ([place]) 01/03/2018  . Generalized anxiety disorder 01/03/2018  . Menorrhagia with irregular cycle 11/19/2013    Past Surgical History:  Past Surgical History:  Procedure Laterality Date  . BREAST BIOPSY N/A early 2000s   benign  . [place]   x 2   . DILITATION & CURRETTAGE/HYSTROSCOPY WITH THERMACHOICE ABLATION N/A 01/13/2014   Procedure: DILATATION & CURETTAGE/HYSTEROSCOPY WITH THERMACHOICE ABLATION;  Surgeon: [name], MD;  Location: [facility];  Service: Gynecology;  Laterality: N/A;  uteral sound is to 11cm,13ml of D5W in, 18ml out. temp:87 degree celcious total therapy time-38min, 57 sec  . TUBAL LIGATION      Family History:  Family History  Problem Relation Age of Onset  . Hypertension Mother   . Heart attack Father   . Stroke Maternal  Grandmother   . Heart attack Maternal Grandfather     Social History:  Social History   Socioeconomic History  . Marital status: Divorced    Spouse name: Not on file  . Number of children: 3  . Years of education: 74  . Highest education level: Some college, no degree  Occupational History  . Occupation: [employer]  Tobacco Use  . Smoking status: Current Every Day Smoker    Packs/day: 0.25    Years: 30.00    Pack years: 7.50    Types: Cigarettes  . Smokeless tobacco: Never Used  . Tobacco comment: Heavy smoker for most of her years of smoking  Vaping Use  . Vaping Use: Never used  Substance and Sexual Activity  . Alcohol use: No  . Drug use: No  . Sexual activity: Yes    Birth control/protection: Surgical  Other Topics Concern  . 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 Difficulty of Paying Living Expenses: Not on file  Food Insecurity:   . Worried About Charity fundraiser in the Last Year: Not on file  . Ran Out of Food in the Last Year: Not on file  Transportation Needs:   . Lack of Transportation (Medical): Not on file  . Lack of Transportation (Non-Medical): Not on file  Physical Activity:   . Days of Exercise per Week: Not on  file  . Minutes of Exercise per Session: Not on file  Stress:   . Feeling of Stress : Not on file  Social Connections:   . Frequency of Communication with Friends and Family: Not on file  . Frequency of Social Gatherings with Friends and Family: Not on file  . Attends Religious Services: Not on file  . Active Member of Clubs or Organizations: Not on file  . Attends Archivist Meetings: Not on file  . Marital Status: Not on file  Intimate Partner Violence:   . Fear of Current or Ex-Partner: Not on file  . Emotionally Abused: Not on file  . Physically Abused: Not on file  . Sexually Abused: Not on file    Allergies:    Allergies  Allergen Reactions  . Zoloft [Sertraline Hcl]     Unpleasant thoughts    Medications: Prior to Admission medications   Medication Sig Start Date End Date Taking? Authorizing Provider  baclofen (LIORESAL) 10 MG tablet Take 0.5-1 tablets (5-10 mg total) by mouth 3 (three) times daily as needed for muscle spasms. 01/29/20  Yes Delsa Grana, PA-C  buPROPion (WELLBUTRIN XL) 150 MG 24 hr tablet Take 150 mg by mouth daily.  08/28/19  Yes [provider]  cloNIDine (CATAPRES) 0.1 MG tablet Take 1 tablet (0.1 mg total) by mouth at bedtime as needed (for hot flashes/sweats). Patient taking differently: Take 0.1 mg by mouth at bedtime.  12/05/19  Yes Delsa Grana, PA-C  GNP NAPROXEN SODIUM 220 MG tablet TAKE 1 TABLET BY MOUTH TWICE DAILY AS NEEDED FOR PAIN 01/29/20  Yes Delsa Grana, PA-C  progesterone (PROMETRIUM) 200 MG capsule Take 200 mg by mouth daily.  08/28/19  Yes [provider]  promethazine (PHENERGAN) 25 MG suppository Place 1 suppository (25 mg total) rectally every 6 (six) hours as needed for nausea or vomiting. 01/09/20  Yes Delsa Grana, PA-C  rizatriptan (MAXALT-MLT) 10 MG disintegrating tablet Take 1 tablet (10 mg total) by mouth as needed for migraine. May repeat in 2 hours if needed 12/05/19  Yes Delsa Grana, PA-C  SUMAtriptan 6 MG/0.5ML SOAJ Inject 6 mg SQ at onset of migraine, may repeat dose in 2 hours if not improved, Maximum dose: 6 mg per dose, 12 mg in 24 hours Patient taking differently: Inject 6 mg into the muscle See admin instructions. Inject 6 mg SQ at onset of migraine, may repeat dose in 2 hours if not improved, Maximum dose: 6 mg per dose, 12 mg in 24 hours 01/09/20  Yes Delsa Grana, PA-C    Physical Exam Vitals: Blood pressure 126/82, height 5\' 6"  (1.676 m), weight 247 lb (112 kg). General: NAD HEENT: normocephalic, anicteric Pulmonary: No increased work of breathing, CTAB Cardiovascular: RRR, distal pulses 2+ Abdomen: soft, non-tender,  non-distended Genitourinary: deferred Extremities: no edema, erythema, or tenderness Neurologic: Grossly intact Psychiatric: mood appropriate, affect full  Imaging No results found.  Assessment: 49 y.o. No obstetric history on file. presenting for scheduled TLH, BS, cystoscopy  Plan: 1) Patient opts for definitive surgical management via hysterectomy. The risks of surgery were discussed in detail with the patient including but not limited to: bleeding which may require transfusion or reoperation; infection which may require antibiotics; injury to bowel, bladder, ureters or other surrounding organs (With a literature reported rate of urinary tract injury of 1% quoted); need for additional procedures including laparotomy; thromboembolic phenomenon, incisional problems and other postoperative/anesthesia complications.  Patient was also advised that recovery procedure generally involves an overnight  stay; and the  expected recovery time after a hysterectomy being in the range of 6-8 weeks.  Likelihood of success in alleviating the patient's symptoms was discussed.  While definitive in regards to issues with menstural bleeding, pelvic pain if present preoperatively may continue and in fact worsen postoperatively.  She is aware that the procedure will render her unable to pursue childbearing in the future.   She was told that she will be contacted by our surgical scheduler regarding the time and date of her surgery; routine preoperative instructions of having nothing to eat or drink after midnight on the day prior to surgery and also coming to the hospital 1.5 hours prior to her time of surgery were also emphasized.  She was told she may be called for a preoperative appointment about a week prior to surgery and will be given further preoperative instructions at that visit.  Routine postoperative instructions will be reviewed with the patient and her family in detail after surgery. Printed patient education  handouts about the procedure was given to the patient to review at home.   2) Routine postoperative instructions were reviewed with the patient and her family in detail today including the expected length of recovery and likely postoperative course.  The patient concurred with the proposed plan, giving informed written consent for the surgery today.  Patient instructed on the importance of being NPO after midnight prior to her procedure.  If warranted preoperative prophylactic antibiotics and SCDs ordered on call to the OR to meet SCIP guidelines and adhere to recommendation laid forth in [place] Number 104 May 2009  "Antibiotic Prophylaxis for Gynecologic Procedures".     Malachy Mood, MD, [facility], [facility] 01/30/2020, 10:37 AM

## 2020-01-30 NOTE — H&P (View-Only) (Signed)
Obstetrics & Gynecology Surgery H&P    Chief Complaint: Scheduled Surgery   History of Present Illness: Patient is a 49 y.o. No obstetric history on file. presenting for scheduled TLH, BS, cyst, for the treatment or further evaluation of AUB.   Prior Treatments prior to proceeding with surgery include: endometrial ablation       Pap 02/07/2018 NILM HPV negative             Endometrial biopsy 09/11/2019 Benign endometrial fragments with focal stromal breakdown and features suggestive of polyp. Negative for atypia, endometritis, and malignancy             TVUS 09/11/2019 EMS of 92mm, no focal masses, no evidence of fibroids, normal right ovary, non-visualized left ovary Previous Treatment: Endometrial ablation   Review of Systems:10 point review of systems  Past Medical History:  Patient Active Problem List   Diagnosis Date Noted  . Migraine without status migrainosus, not intractable 01/09/2020  . Tobacco abuse 04/15/2018  . Preventative health care 02/09/2018  . Obesity (BMI 30.0-34.9) 02/09/2018  . Multiple personality disorder ([place]) 01/03/2018  . Mild episode of recurrent major depressive disorder ([place]) 01/03/2018  . Generalized anxiety disorder 01/03/2018  . Menorrhagia with irregular cycle 11/19/2013    Past Surgical History:  Past Surgical History:  Procedure Laterality Date  . BREAST BIOPSY N/A early 2000s   benign  . [place]   x 2   . DILITATION & CURRETTAGE/HYSTROSCOPY WITH THERMACHOICE ABLATION N/A 01/13/2014   Procedure: DILATATION & CURETTAGE/HYSTEROSCOPY WITH THERMACHOICE ABLATION;  Surgeon: [name], MD;  Location: [facility];  Service: Gynecology;  Laterality: N/A;  uteral sound is to 11cm,50ml of D5W in, 41ml out. temp:87 degree celcious total therapy time-73min, 57 sec  . TUBAL LIGATION      Family History:  Family History  Problem Relation Age of Onset  . Hypertension Mother   . Heart attack Father   . Stroke Maternal  Grandmother   . Heart attack Maternal Grandfather     Social History:  Social History   Socioeconomic History  . Marital status: Divorced    Spouse name: Not on file  . Number of children: 3  . Years of education: 56  . Highest education level: Some college, no degree  Occupational History  . Occupation: [employer]  Tobacco Use  . Smoking status: Current Every Day Smoker    Packs/day: 0.25    Years: 30.00    Pack years: 7.50    Types: Cigarettes  . Smokeless tobacco: Never Used  . Tobacco comment: Heavy smoker for most of her years of smoking  Vaping Use  . Vaping Use: Never used  Substance and Sexual Activity  . Alcohol use: No  . Drug use: No  . Sexual activity: Yes    Birth control/protection: Surgical  Other Topics Concern  . 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 Difficulty of Paying Living Expenses: Not on file  Food Insecurity:   . Worried About Charity fundraiser in the Last Year: Not on file  . Ran Out of Food in the Last Year: Not on file  Transportation Needs:   . Lack of Transportation (Medical): Not on file  . Lack of Transportation (Non-Medical): Not on file  Physical Activity:   . Days of Exercise per Week: Not on  file  . Minutes of Exercise per Session: Not on file  Stress:   . Feeling of Stress : Not on file  Social Connections:   . Frequency of Communication with Friends and Family: Not on file  . Frequency of Social Gatherings with Friends and Family: Not on file  . Attends Religious Services: Not on file  . Active Member of Clubs or Organizations: Not on file  . Attends Archivist Meetings: Not on file  . Marital Status: Not on file  Intimate Partner Violence:   . Fear of Current or Ex-Partner: Not on file  . Emotionally Abused: Not on file  . Physically Abused: Not on file  . Sexually Abused: Not on file    Allergies:    Allergies  Allergen Reactions  . Zoloft [Sertraline Hcl]     Unpleasant thoughts    Medications: Prior to Admission medications   Medication Sig Start Date End Date Taking? Authorizing Provider  baclofen (LIORESAL) 10 MG tablet Take 0.5-1 tablets (5-10 mg total) by mouth 3 (three) times daily as needed for muscle spasms. 01/29/20  Yes Delsa Grana, PA-C  buPROPion (WELLBUTRIN XL) 150 MG 24 hr tablet Take 150 mg by mouth daily.  08/28/19  Yes [provider]  cloNIDine (CATAPRES) 0.1 MG tablet Take 1 tablet (0.1 mg total) by mouth at bedtime as needed (for hot flashes/sweats). Patient taking differently: Take 0.1 mg by mouth at bedtime.  12/05/19  Yes Delsa Grana, PA-C  GNP NAPROXEN SODIUM 220 MG tablet TAKE 1 TABLET BY MOUTH TWICE DAILY AS NEEDED FOR PAIN 01/29/20  Yes Delsa Grana, PA-C  progesterone (PROMETRIUM) 200 MG capsule Take 200 mg by mouth daily.  08/28/19  Yes [provider]  promethazine (PHENERGAN) 25 MG suppository Place 1 suppository (25 mg total) rectally every 6 (six) hours as needed for nausea or vomiting. 01/09/20  Yes Delsa Grana, PA-C  rizatriptan (MAXALT-MLT) 10 MG disintegrating tablet Take 1 tablet (10 mg total) by mouth as needed for migraine. May repeat in 2 hours if needed 12/05/19  Yes Delsa Grana, PA-C  SUMAtriptan 6 MG/0.5ML SOAJ Inject 6 mg SQ at onset of migraine, may repeat dose in 2 hours if not improved, Maximum dose: 6 mg per dose, 12 mg in 24 hours Patient taking differently: Inject 6 mg into the muscle See admin instructions. Inject 6 mg SQ at onset of migraine, may repeat dose in 2 hours if not improved, Maximum dose: 6 mg per dose, 12 mg in 24 hours 01/09/20  Yes Delsa Grana, PA-C    Physical Exam Vitals: Blood pressure 126/82, height 5\' 6"  (1.676 m), weight 247 lb (112 kg). General: NAD HEENT: normocephalic, anicteric Pulmonary: No increased work of breathing, CTAB Cardiovascular: RRR, distal pulses 2+ Abdomen: soft, non-tender,  non-distended Genitourinary: deferred Extremities: no edema, erythema, or tenderness Neurologic: Grossly intact Psychiatric: mood appropriate, affect full  Imaging No results found.  Assessment: 49 y.o. No obstetric history on file. presenting for scheduled TLH, BS, cystoscopy  Plan: 1) Patient opts for definitive surgical management via hysterectomy. The risks of surgery were discussed in detail with the patient including but not limited to: bleeding which may require transfusion or reoperation; infection which may require antibiotics; injury to bowel, bladder, ureters or other surrounding organs (With a literature reported rate of urinary tract injury of 1% quoted); need for additional procedures including laparotomy; thromboembolic phenomenon, incisional problems and other postoperative/anesthesia complications.  Patient was also advised that recovery procedure generally involves an overnight  stay; and the  expected recovery time after a hysterectomy being in the range of 6-8 weeks.  Likelihood of success in alleviating the patient's symptoms was discussed.  While definitive in regards to issues with menstural bleeding, pelvic pain if present preoperatively may continue and in fact worsen postoperatively.  She is aware that the procedure will render her unable to pursue childbearing in the future.   She was told that she will be contacted by our surgical scheduler regarding the time and date of her surgery; routine preoperative instructions of having nothing to eat or drink after midnight on the day prior to surgery and also coming to the hospital 1.5 hours prior to her time of surgery were also emphasized.  She was told she may be called for a preoperative appointment about a week prior to surgery and will be given further preoperative instructions at that visit.  Routine postoperative instructions will be reviewed with the patient and her family in detail after surgery. Printed patient education  handouts about the procedure was given to the patient to review at home.   2) Routine postoperative instructions were reviewed with the patient and her family in detail today including the expected length of recovery and likely postoperative course.  The patient concurred with the proposed plan, giving informed written consent for the surgery today.  Patient instructed on the importance of being NPO after midnight prior to her procedure.  If warranted preoperative prophylactic antibiotics and SCDs ordered on call to the OR to meet SCIP guidelines and adhere to recommendation laid forth in [place] Number 104 May 2009  "Antibiotic Prophylaxis for Gynecologic Procedures".     Malachy Mood, MD, [facility], [facility] 01/30/2020, 10:37 AM

## 2020-02-02 ENCOUNTER — Other Ambulatory Visit: Payer: Self-pay

## 2020-02-02 ENCOUNTER — Other Ambulatory Visit
Admission: RE | Admit: 2020-02-02 | Discharge: 2020-02-02 | Disposition: A | Discharge status: Home / Self Care | Payer: 59 | Source: Ambulatory Visit | Attending: Obstetrics and Gynecology | Admitting: Obstetrics and Gynecology

## 2020-02-02 DIAGNOSIS — Z01812 Encounter for preprocedural laboratory examination: Secondary | ICD-10-CM | POA: Diagnosis present

## 2020-02-02 DIAGNOSIS — Z20822 Contact with and (suspected) exposure to covid-19: Secondary | ICD-10-CM | POA: Insufficient documentation

## 2020-02-02 LAB — COMPREHENSIVE METABOLIC PANEL
ALT: 13 U/L (ref 0–44)
AST: 16 U/L (ref 15–41)
Albumin: 3.8 g/dL (ref 3.5–5.0)
Alkaline Phosphatase: 62 U/L (ref 38–126)
Anion gap: 10 (ref 5–15)
BUN: 14 mg/dL (ref 6–20)
CO2: 24 mmol/L (ref 22–32)
Calcium: 9.1 mg/dL (ref 8.9–10.3)
Chloride: 107 mmol/L (ref 98–111)
Creatinine, Ser: 0.81 mg/dL (ref 0.44–1.00)
GFR, Estimated: >60 mL/min (ref >60)
Glucose, Bld: 93 mg/dL (ref 70–99)
Potassium: 3.8 mmol/L (ref 3.5–5.1)
Sodium: 141 mmol/L (ref 135–145)
Total Bilirubin: 0.4 mg/dL (ref 0.3–1.2)
Total Protein: 7 g/dL (ref 6.5–8.1)

## 2020-02-02 LAB — CBC
HCT: 39.7 % (ref 36.0–46.0)
Hemoglobin: 13.1 g/dL (ref 12.0–15.0)
MCH: 29.4 pg (ref 26.0–34.0)
MCHC: 33 g/dL (ref 30.0–36.0)
MCV: 89.2 fL (ref 80.0–100.0)
Platelets: 223 10*3/uL (ref 150–400)
RBC: 4.45 MIL/uL (ref 3.87–5.11)
RDW: 12.7 % (ref 11.5–15.5)
WBC: 9.2 10*3/uL (ref 4.0–10.5)
nRBC: 0 % (ref 0.0–0.2)

## 2020-02-02 LAB — TYPE AND SCREEN
ABO/RH(D): A POS
Antibody Screen: NEGATIVE

## 2020-02-03 ENCOUNTER — Ambulatory Visit: Payer: 59 | Admitting: Anesthesiology

## 2020-02-03 ENCOUNTER — Encounter
Admission: RE | Disposition: A | Discharge status: Home / Self Care | Payer: Self-pay | Source: Home / Self Care | Attending: Obstetrics and Gynecology

## 2020-02-03 ENCOUNTER — Observation Stay
Admission: RE | Admit: 2020-02-03 | Discharge: 2020-02-04 | Disposition: A | Discharge status: Home / Self Care | Payer: 59 | Attending: Obstetrics and Gynecology | Admitting: Obstetrics and Gynecology

## 2020-02-03 ENCOUNTER — Encounter: Payer: Self-pay | Admitting: Obstetrics and Gynecology

## 2020-02-03 DIAGNOSIS — F17210 Nicotine dependence, cigarettes, uncomplicated: Secondary | ICD-10-CM | POA: Diagnosis not present

## 2020-02-03 DIAGNOSIS — Z9851 Tubal ligation status: Secondary | ICD-10-CM | POA: Diagnosis not present

## 2020-02-03 DIAGNOSIS — D251 Intramural leiomyoma of uterus: Principal | ICD-10-CM | POA: Insufficient documentation

## 2020-02-03 DIAGNOSIS — N939 Abnormal uterine and vaginal bleeding, unspecified: Secondary | ICD-10-CM

## 2020-02-03 DIAGNOSIS — Z90710 Acquired absence of both cervix and uterus: Secondary | ICD-10-CM | POA: Diagnosis present

## 2020-02-03 DIAGNOSIS — N838 Other noninflammatory disorders of ovary, fallopian tube and broad ligament: Secondary | ICD-10-CM

## 2020-02-03 DIAGNOSIS — N800 Endometriosis of uterus: Secondary | ICD-10-CM | POA: Diagnosis not present

## 2020-02-03 HISTORY — PX: TOTAL LAPAROSCOPIC HYSTERECTOMY WITH SALPINGECTOMY: SHX6742

## 2020-02-03 HISTORY — PX: CYSTOSCOPY: SHX5120

## 2020-02-03 LAB — POCT URINE PREGNANCY: Preg Test, Ur: NEGATIVE

## 2020-02-03 LAB — SARS CORONAVIRUS 2 (TAT 6-24 HRS): SARS Coronavirus 2: NEGATIVE

## 2020-02-03 LAB — ABO/RH: ABO/RH(D): A POS

## 2020-02-03 SURGERY — HYSTERECTOMY, TOTAL, LAPAROSCOPIC, WITH SALPINGECTOMY
Anesthesia: General

## 2020-02-03 MED ORDER — DEXMEDETOMIDINE (PRECEDEX) IN NS 20 MCG/5ML (4 MCG/ML) IV SYRINGE
PREFILLED_SYRINGE | INTRAVENOUS | Status: DC | PRN
  Administered 2020-02-03: 8 ug via INTRAVENOUS

## 2020-02-03 MED ORDER — PROPOFOL 10 MG/ML IV BOLUS
INTRAVENOUS | Status: AC
  Filled 2020-02-03: qty 60

## 2020-02-03 MED ORDER — ONDANSETRON HCL 4 MG PO TABS: 4.0000 mg | ORAL_TABLET | Freq: Four times a day (QID) | ORAL | Status: DC | PRN

## 2020-02-03 MED ORDER — SUGAMMADEX SODIUM 500 MG/5ML IV SOLN
INTRAVENOUS | Status: DC | PRN
  Administered 2020-02-03: 400 mg via INTRAVENOUS

## 2020-02-03 MED ORDER — MIDAZOLAM HCL 2 MG/2ML IJ SOLN
INTRAMUSCULAR | Status: DC | PRN
  Administered 2020-02-03: 2 mg via INTRAVENOUS

## 2020-02-03 MED ORDER — FENTANYL CITRATE (PF) 100 MCG/2ML IJ SOLN
INTRAMUSCULAR | Status: AC
  Administered 2020-02-03: 25 ug via INTRAVENOUS
  Filled 2020-02-03: qty 2

## 2020-02-03 MED ORDER — PROPOFOL 10 MG/ML IV BOLUS
INTRAVENOUS | Status: DC | PRN
  Administered 2020-02-03: 150 mg via INTRAVENOUS
  Administered 2020-02-03: 50 mg via INTRAVENOUS

## 2020-02-03 MED ORDER — EPHEDRINE 5 MG/ML INJ
INTRAVENOUS | Status: AC
  Filled 2020-02-03: qty 10

## 2020-02-03 MED ORDER — ACETAMINOPHEN 10 MG/ML IV SOLN
INTRAVENOUS | Status: DC | PRN
  Administered 2020-02-03: 1000 mg via INTRAVENOUS

## 2020-02-03 MED ORDER — ROCURONIUM BROMIDE 100 MG/10ML IV SOLN
INTRAVENOUS | Status: DC | PRN
  Administered 2020-02-03: 30 mg via INTRAVENOUS
  Administered 2020-02-03: 50 mg via INTRAVENOUS

## 2020-02-03 MED ORDER — SIMETHICONE 80 MG PO CHEW: 80.0000 mg | CHEWABLE_TABLET | Freq: Four times a day (QID) | ORAL | Status: DC | PRN

## 2020-02-03 MED ORDER — ONDANSETRON HCL 4 MG/2ML IJ SOLN
4.0000 mg | Freq: Four times a day (QID) | INTRAMUSCULAR | Status: DC | PRN
  Filled 2020-02-03: qty 2

## 2020-02-03 MED ORDER — FENTANYL CITRATE (PF) 100 MCG/2ML IJ SOLN
INTRAMUSCULAR | Status: AC
  Filled 2020-02-03: qty 2

## 2020-02-03 MED ORDER — CEFAZOLIN SODIUM-DEXTROSE 2-4 GM/100ML-% IV SOLN
INTRAVENOUS | Status: AC
  Filled 2020-02-03: qty 100

## 2020-02-03 MED ORDER — GLYCOPYRROLATE 0.2 MG/ML IJ SOLN
INTRAMUSCULAR | Status: DC | PRN
  Administered 2020-02-03 (×2): .2 mg via INTRAVENOUS

## 2020-02-03 MED ORDER — HYDROMORPHONE HCL 1 MG/ML IJ SOLN
INTRAMUSCULAR | Status: AC
  Filled 2020-02-03: qty 1

## 2020-02-03 MED ORDER — SUGAMMADEX SODIUM 500 MG/5ML IV SOLN
INTRAVENOUS | Status: AC
  Filled 2020-02-03: qty 5

## 2020-02-03 MED ORDER — FENTANYL CITRATE (PF) 100 MCG/2ML IJ SOLN
INTRAMUSCULAR | Status: DC | PRN
  Administered 2020-02-03: 50 ug via INTRAVENOUS
  Administered 2020-02-03: 100 ug via INTRAVENOUS
  Administered 2020-02-03: 50 ug via INTRAVENOUS

## 2020-02-03 MED ORDER — ONDANSETRON HCL 4 MG/2ML IJ SOLN
INTRAMUSCULAR | Status: AC
  Filled 2020-02-03: qty 4

## 2020-02-03 MED ORDER — SCOPOLAMINE 1 MG/3DAYS TD PT72
MEDICATED_PATCH | TRANSDERMAL | Status: AC
  Administered 2020-02-03: 1.5 mg via TRANSDERMAL
  Filled 2020-02-03: qty 1

## 2020-02-03 MED ORDER — BUPROPION HCL ER (XL) 150 MG PO TB24
150.0000 mg | ORAL_TABLET | Freq: Every day | ORAL | Status: DC
  Administered 2020-02-03: 150 mg via ORAL
  Filled 2020-02-03 (×2): qty 1

## 2020-02-03 MED ORDER — BUPIVACAINE HCL 0.5 % IJ SOLN
INTRAMUSCULAR | Status: DC | PRN
  Administered 2020-02-03: 15 mL

## 2020-02-03 MED ORDER — KETOROLAC TROMETHAMINE 30 MG/ML IJ SOLN
INTRAMUSCULAR | Status: DC | PRN
  Administered 2020-02-03: 30 mg via INTRAVENOUS

## 2020-02-03 MED ORDER — EPHEDRINE SULFATE 50 MG/ML IJ SOLN
INTRAMUSCULAR | Status: DC | PRN
  Administered 2020-02-03: 10 mg via INTRAVENOUS

## 2020-02-03 MED ORDER — LIDOCAINE HCL (PF) 2 % IJ SOLN
INTRAMUSCULAR | Status: AC
  Filled 2020-02-03: qty 20

## 2020-02-03 MED ORDER — SCOPOLAMINE 1 MG/3DAYS TD PT72
1.0000 | MEDICATED_PATCH | TRANSDERMAL | Status: DC
  Administered 2020-02-03: 1.5 mg via TRANSDERMAL

## 2020-02-03 MED ORDER — OXYCODONE-ACETAMINOPHEN 5-325 MG PO TABS
1.0000 | ORAL_TABLET | ORAL | Status: DC | PRN
  Administered 2020-02-03 – 2020-02-04 (×4): 2 via ORAL
  Filled 2020-02-03 (×4): qty 2

## 2020-02-03 MED ORDER — SODIUM CHLORIDE 0.9 % IV SOLN
150.0000 mg | Freq: Once | INTRAVENOUS | Status: AC
  Administered 2020-02-03: 150 mg via INTRAVENOUS
  Filled 2020-02-03: qty 5

## 2020-02-03 MED ORDER — PROPOFOL 10 MG/ML IV BOLUS
INTRAVENOUS | Status: AC
  Filled 2020-02-03: qty 40

## 2020-02-03 MED ORDER — ROCURONIUM BROMIDE 10 MG/ML (PF) SYRINGE
PREFILLED_SYRINGE | INTRAVENOUS | Status: AC
  Filled 2020-02-03: qty 10

## 2020-02-03 MED ORDER — HYDROMORPHONE HCL 1 MG/ML IJ SOLN
INTRAMUSCULAR | Status: DC | PRN
Start: 2020-02-03 — End: 2020-02-03
  Administered 2020-02-03 (×2): .5 mg via INTRAVENOUS

## 2020-02-03 MED ORDER — DEXTROSE-NACL 5-0.45 % IV SOLN: INTRAVENOUS | Status: DC

## 2020-02-03 MED ORDER — FAMOTIDINE 20 MG PO TABS: 20.0000 mg | ORAL_TABLET | Freq: Once | ORAL | Status: AC

## 2020-02-03 MED ORDER — HEMOSTATIC AGENTS (NO CHARGE) OPTIME
TOPICAL | Status: DC | PRN
  Administered 2020-02-03: 1 via TOPICAL

## 2020-02-03 MED ORDER — GLYCOPYRROLATE 0.2 MG/ML IJ SOLN
INTRAMUSCULAR | Status: AC
  Filled 2020-02-03: qty 2

## 2020-02-03 MED ORDER — MENTHOL 3 MG MT LOZG
1.0000 | LOZENGE | OROMUCOSAL | Status: DC | PRN
  Filled 2020-02-03 (×3): qty 9

## 2020-02-03 MED ORDER — KETOROLAC TROMETHAMINE 30 MG/ML IJ SOLN
INTRAMUSCULAR | Status: AC
  Filled 2020-02-03: qty 1

## 2020-02-03 MED ORDER — ONDANSETRON HCL 4 MG/2ML IJ SOLN: 4.0000 mg | Freq: Once | INTRAMUSCULAR | Status: DC | PRN

## 2020-02-03 MED ORDER — ACETAMINOPHEN 10 MG/ML IV SOLN
INTRAVENOUS | Status: AC
  Filled 2020-02-03: qty 100

## 2020-02-03 MED ORDER — LIDOCAINE HCL (CARDIAC) PF 100 MG/5ML IV SOSY
PREFILLED_SYRINGE | INTRAVENOUS | Status: DC | PRN
  Administered 2020-02-03: 100 mg via INTRAVENOUS

## 2020-02-03 MED ORDER — PHENYLEPHRINE HCL (PRESSORS) 10 MG/ML IV SOLN
INTRAVENOUS | Status: AC
  Filled 2020-02-03: qty 1

## 2020-02-03 MED ORDER — DEXAMETHASONE SODIUM PHOSPHATE 10 MG/ML IJ SOLN
INTRAMUSCULAR | Status: DC | PRN
  Administered 2020-02-03: 10 mg via INTRAVENOUS

## 2020-02-03 MED ORDER — ORAL CARE MOUTH RINSE: 15.0000 mL | Freq: Once | OROMUCOSAL | Status: AC

## 2020-02-03 MED ORDER — DEXMEDETOMIDINE (PRECEDEX) IN NS 20 MCG/5ML (4 MCG/ML) IV SYRINGE
PREFILLED_SYRINGE | INTRAVENOUS | Status: AC
  Filled 2020-02-03: qty 5

## 2020-02-03 MED ORDER — CHLORHEXIDINE GLUCONATE 0.12 % MT SOLN: 15.0000 mL | Freq: Once | OROMUCOSAL | Status: AC

## 2020-02-03 MED ORDER — FAMOTIDINE 20 MG PO TABS
ORAL_TABLET | ORAL | Status: AC
  Administered 2020-02-03: 20 mg via ORAL
  Filled 2020-02-03: qty 1

## 2020-02-03 MED ORDER — LACTATED RINGERS IV SOLN: INTRAVENOUS | Status: DC

## 2020-02-03 MED ORDER — DEXAMETHASONE SODIUM PHOSPHATE 10 MG/ML IJ SOLN
INTRAMUSCULAR | Status: AC
  Filled 2020-02-03: qty 1

## 2020-02-03 MED ORDER — KETOROLAC TROMETHAMINE 30 MG/ML IJ SOLN
30.0000 mg | Freq: Four times a day (QID) | INTRAMUSCULAR | Status: DC | PRN
  Administered 2020-02-03 – 2020-02-04 (×3): 30 mg via INTRAVENOUS
  Filled 2020-02-03 (×3): qty 1

## 2020-02-03 MED ORDER — POVIDONE-IODINE 10 % EX SWAB
2.0000 "application " | Freq: Once | CUTANEOUS | Status: AC
  Administered 2020-02-03: 2 via TOPICAL

## 2020-02-03 MED ORDER — FENTANYL CITRATE (PF) 100 MCG/2ML IJ SOLN
25.0000 ug | INTRAMUSCULAR | Status: AC | PRN
  Administered 2020-02-03 (×6): 25 ug via INTRAVENOUS

## 2020-02-03 MED ORDER — ONDANSETRON HCL 4 MG/2ML IJ SOLN
INTRAMUSCULAR | Status: DC | PRN
  Administered 2020-02-03 (×2): 4 mg via INTRAVENOUS

## 2020-02-03 MED ORDER — CEFAZOLIN SODIUM-DEXTROSE 2-4 GM/100ML-% IV SOLN
2.0000 g | INTRAVENOUS | Status: AC
  Administered 2020-02-03: 2 g via INTRAVENOUS

## 2020-02-03 MED ORDER — MIDAZOLAM HCL 2 MG/2ML IJ SOLN
INTRAMUSCULAR | Status: AC
  Filled 2020-02-03: qty 2

## 2020-02-03 MED ORDER — CHLORHEXIDINE GLUCONATE 0.12 % MT SOLN
OROMUCOSAL | Status: AC
  Administered 2020-02-03: 15 mL via OROMUCOSAL
  Filled 2020-02-03: qty 15

## 2020-02-03 SURGICAL SUPPLY — 51 items
APPLICATOR ARISTA FLEXITIP XL (MISCELLANEOUS) ×3 IMPLANT
BAG URINE DRAIN 2000ML AR STRL (UROLOGICAL SUPPLIES) ×3 IMPLANT
BLADE SURG SZ11 CARB STEEL (BLADE) ×3 IMPLANT
CATH FOLEY 2WAY  5CC 16FR (CATHETERS) ×1
CATH URTH 16FR FL 2W BLN LF (CATHETERS) ×2 IMPLANT
CHLORAPREP W/TINT 26 (MISCELLANEOUS) ×3 IMPLANT
COVER WAND RF STERILE (DRAPES) ×3 IMPLANT
DEFOGGER SCOPE WARMER CLEARIFY (MISCELLANEOUS) ×3 IMPLANT
DERMABOND ADVANCED (GAUZE/BANDAGES/DRESSINGS) ×1
DERMABOND ADVANCED .7 DNX12 (GAUZE/BANDAGES/DRESSINGS) ×2 IMPLANT
DEVICE SUTURE ENDOST 10MM (ENDOMECHANICALS) ×3 IMPLANT
GAUZE 4X4 16PLY RFD (DISPOSABLE) ×3 IMPLANT
GLOVE BIO SURGEON STRL SZ7 (GLOVE) ×12 IMPLANT
GLOVE INDICATOR 7.5 STRL GRN (GLOVE) ×12 IMPLANT
GOWN STRL REUS W/ TWL LRG LVL3 (GOWN DISPOSABLE) ×6 IMPLANT
GOWN STRL REUS W/ TWL XL LVL3 (GOWN DISPOSABLE) ×2 IMPLANT
GOWN STRL REUS W/TWL LRG LVL3 (GOWN DISPOSABLE) ×3
GOWN STRL REUS W/TWL XL LVL3 (GOWN DISPOSABLE) ×1
GRASPER SUT TROCAR 14GX15 (MISCELLANEOUS) ×3 IMPLANT
HEMOSTAT ARISTA ABSORB 3G PWDR (HEMOSTASIS) ×3 IMPLANT
IRRIGATION STRYKERFLOW (MISCELLANEOUS) ×2 IMPLANT
IRRIGATOR STRYKERFLOW (MISCELLANEOUS) ×3
IV LACTATED RINGERS 1000ML (IV SOLUTION) ×3 IMPLANT
IV NS 1000ML (IV SOLUTION) ×1
IV NS 1000ML BAXH (IV SOLUTION) ×2 IMPLANT
KIT PINK PAD W/HEAD ARE REST (MISCELLANEOUS) ×3
KIT PINK PAD W/HEAD ARM REST (MISCELLANEOUS) ×2 IMPLANT
LABEL OR SOLS (LABEL) ×3 IMPLANT
MANIFOLD NEPTUNE II (INSTRUMENTS) ×3 IMPLANT
MANIPULATOR VCARE STD CRV RETR (MISCELLANEOUS) ×3 IMPLANT
NS IRRIG 500ML POUR BTL (IV SOLUTION) ×3 IMPLANT
OCCLUDER COLPOPNEUMO (BALLOONS) ×3 IMPLANT
PACK GYN LAPAROSCOPIC (MISCELLANEOUS) ×3 IMPLANT
PAD OB MATERNITY 4.3X12.25 (PERSONAL CARE ITEMS) ×3 IMPLANT
PAD PREP 24X41 OB/GYN DISP (PERSONAL CARE ITEMS) ×3 IMPLANT
SCISSORS METZENBAUM CVD 33 (INSTRUMENTS) ×3 IMPLANT
SET CYSTO W/LG BORE CLAMP LF (SET/KITS/TRAYS/PACK) ×3 IMPLANT
SHEARS HARMONIC ACE PLUS 36CM (ENDOMECHANICALS) ×3 IMPLANT
SLEEVE ENDOPATH XCEL 5M (ENDOMECHANICALS) ×6 IMPLANT
SURGILUBE 2OZ TUBE FLIPTOP (MISCELLANEOUS) ×3 IMPLANT
SUT ENDO VLOC 180-0-8IN (SUTURE) ×3 IMPLANT
SUT MNCRL 4-0 (SUTURE) ×2
SUT MNCRL 4-0 27XMFL (SUTURE) ×4
SUT VIC AB 0 CT1 27 (SUTURE) ×1
SUT VIC AB 0 CT1 27XCR 8 STRN (SUTURE) ×2 IMPLANT
SUTURE MNCRL 4-0 27XMF (SUTURE) ×4 IMPLANT
SYR 10ML LL (SYRINGE) ×3 IMPLANT
SYR 50ML LL SCALE MARK (SYRINGE) ×3 IMPLANT
TROCAR ENDO BLADELESS 11MM (ENDOMECHANICALS) ×3 IMPLANT
TROCAR XCEL NON-BLD 5MMX100MML (ENDOMECHANICALS) ×3 IMPLANT
TUBING EVAC SMOKE HEATED PNEUM (TUBING) ×3 IMPLANT

## 2020-02-03 NOTE — Op Note (Addendum)
Preoperative Diagnosis: 1) 49 y.o. with abnormal uterine bleeding  Postoperative Diagnosis: 1) 49 y.o. with abnormal uterine bleeding  Operation Performed: Total laparoscopic hysterectomy, bilateral salpingectomy, and cystoscopy  Indication: 49 year old with abnormal uterine bleeding, history of previous endometrial ablation  Surgeon: Malachy Mood, MD  Assistant: Adrian Prows, MD this surgery required a high level surgical assistant with none other readily available  Anesthesia: General  Preoperative Antibiotics: 2g Ancef  Estimated Blood Loss: 80 mL  IV Fluids: 1L crystaloid  Urine Output:: 1477mL  Drains or Tubes: Foley to gravity drainage  Implants: none  Specimens Removed: Uterus, cervix, and bilateral fallopian tubes  Complications: none  Intraoperative Findings: Normal cervix, uterus, and ovaries.  Both tubes status post prior bilateral partial salpingectomy.  Right tube with paratubal cyst on distal/fimbriated portion of tube.  Patient Condition: stable  Procedure in Detail:  Patient was taken to the operating room where she was administered general anesthesia.  She was positioned in the dorsal lithotomy position utilizing Allen stirups, prepped and draped in the usual sterile fashion.  Prior to proceeding with procedure a time out was performed.  Attention was turned to the patient's pelvis.  An indwelling foley catheter was placed to decompress the patient's bladder.  An operative speculum was placed to allow visualization of the cervix.  The anterior lip of the cervix was grasped with a single tooth tenaculum, and a medium V-care uterine manipulator was placed to allow manipulation of the uterus.  The operative speculum and single tooth tenaculum were then removed.  Attention was turned to the patient's abdomen.  The umbilicus was infiltrated with 1% Sensorcaine, before making a stab incision using an 11 blade scalpel.  A 61mm Excel trocar was then used to  gain direct entry into the peritoneal cavity utilizing the camera to visualize progress of the trocar during placement.  Once peritoneal entry had been achieved, insufflation was started and pneumoperitoneum established at a pressure of 42mmHg.    One left and one right lower quadrant site were then injected with 1% Sensorcaine and a stab incision was made using an 11 blade scalpel.  Two additional 61mm Excel trocars were placed through these incisions under direct visualization. The umbilical trocar was stepped up to an 80mm Excel trocar.  General inspection of the abdomen revealed the above noted findings.   The left tube was identified and grasped at its fimbriated end.  The tube was transected from its attachments to the ovary and mesosalpinx using a 38mm Harmonic scalpel.  The utero ovarian ligament was identified ligated and transected using the Harmonic scalpel. The round ligament was then likewise ligated and transected.  The anterior leaf of the broad ligament was dissected down to the level of the internal cervical os and a bladder flap was started.  The posterior leaf of the broad ligament was dissected down to the utero-sacral ligament.  The uterine artery was skeletonized before being ligated and transected using the Harmonic scalpel with cephelad pressure applied to the V-care device to assure lateralization of the ureter.  A bite was then taken with Harmonic medial to transected portio of uterine artery to further lateralize the ureter and vessel off the V-care cup.  The patient right adnexal structures were then dissected in similar fashion by Dr. Gilman Schmidt.  The bladder flap was completed and the bladder mobilized off the V-care cup.  An anterior colpotomy was scores and carried around in a clockwise fashion to free the specimen, which was then removed vaginally.  The vaginal cuff was then closed using an endostitch device is a running fashion.  The cuff was hemsotatic at the conclusion of closure  without visible or palpable defects.  Carleene Overlie was applied to all pedicles. The 57mm Trocar was removed the fascia closed using a Carter-Thompson and 0 Vicryl suture.    Pneumoperitoneum was evacuated and the remaining trocars were removed.  The 45mm trocar site was closed using a 4-0 Monocryl in a subcuticular fashion.  All trocar sites were then dressed with surgical skin glue.   The indwelling foley catheter was removed.  Cystoscopy was performed noting and intact bladder dome as well as brisk efflux of urine from bother ureteral orifices.  The cystoscopy was removed and the indwelling foley catheter was replaced.  Sponge needle and instrument counts were correct time two.  The patient tolerated the procedure well and was taken to the recovery room in stable condition.

## 2020-02-03 NOTE — Anesthesia Preprocedure Evaluation (Signed)
Anesthesia Evaluation  Patient identified by MRN, date of birth, ID band Patient awake    Reviewed: Allergy & Precautions, NPO status , Patient's Chart, lab work & pertinent test results  History of Anesthesia Complications (+) PONV and history of anesthetic complications  Airway Mallampati: III       Dental   Pulmonary neg sleep apnea, neg COPD, Current Smoker,           Cardiovascular (-) hypertension(-) Past MI and (-) CHF (-) dysrhythmias (-) Valvular Problems/Murmurs     Neuro/Psych neg Seizures Anxiety Depression    GI/Hepatic Neg liver ROS, GERD  ,  Endo/Other  neg diabetes  Renal/GU negative Renal ROS     Musculoskeletal   Abdominal   Peds  Hematology   Anesthesia Other Findings   Reproductive/Obstetrics                             Anesthesia Physical Anesthesia Plan  ASA: II  Anesthesia Plan: General   Post-op Pain Management:    Induction: Intravenous  PONV Risk Score and Plan: 4 or greater and Ondansetron, Dexamethasone and Scopolamine patch - Pre-op  Airway Management Planned: Oral ETT  Additional Equipment:   Intra-op Plan:   Post-operative Plan:   Informed Consent: I have reviewed the patients History and Physical, chart, labs and discussed the procedure including the risks, benefits and alternatives for the proposed anesthesia with the patient or authorized representative who has indicated his/her understanding and acceptance.       Plan Discussed with:   Anesthesia Plan Comments:         Anesthesia Quick Evaluation

## 2020-02-03 NOTE — Anesthesia Procedure Notes (Signed)
Procedure Name: Intubation Performed by: Kelton Pillar, CRNA Pre-anesthesia Checklist: Patient identified, Emergency Drugs available, Suction available and Patient being monitored Patient Re-evaluated:Patient Re-evaluated prior to induction Oxygen Delivery Method: Circle system utilized Preoxygenation: Pre-oxygenation with 100% oxygen Induction Type: IV induction Ventilation: Mask ventilation without difficulty Laryngoscope Size: McGraph and 3 Grade View: Grade I Tube type: Oral Tube size: 7.0 mm Number of attempts: 1 Airway Equipment and Method: Stylet and Oral airway Placement Confirmation: ETT inserted through vocal cords under direct vision,  positive ETCO2,  breath sounds checked- equal and bilateral and CO2 detector Secured at: 21 cm Tube secured with: Tape Dental Injury: Teeth and Oropharynx as per pre-operative assessment  Difficulty Due To: Difficult Airway- due to limited oral opening

## 2020-02-03 NOTE — Transfer of Care (Signed)
Immediate Anesthesia Transfer of Care Note  Patient: Christine [name]  Procedure(s) Performed: TOTAL LAPAROSCOPIC HYSTERECTOMY WITH SALPINGECTOMY (Bilateral ) CYSTOSCOPY (N/A )  Patient Location: PACU  Anesthesia Type:General  Level of Consciousness: awake, oriented, drowsy and patient cooperative  Airway & Oxygen Therapy: Patient Spontanous Breathing  Post-op Assessment: Report given to RN and Post -op Vital signs reviewed and stable  Post vital signs: Reviewed and stable  Last Vitals:  Vitals Value Taken Time  BP 141/90 02/03/20 0945  Temp    Pulse 81 02/03/20 0947  Resp 18 02/03/20 0947  SpO2 97 % 02/03/20 0947  Vitals shown include unvalidated device data.  Last Pain:  Vitals:   02/03/20 0619  PainSc: 0-No pain         Complications: No complications documented.

## 2020-02-03 NOTE — Anesthesia Postprocedure Evaluation (Signed)
Anesthesia Post Note  Patient: Christine [name]  Procedure(s) Performed: TOTAL LAPAROSCOPIC HYSTERECTOMY WITH SALPINGECTOMY (Bilateral ) CYSTOSCOPY (N/A )  Patient location during evaluation: PACU Anesthesia Type: General Level of consciousness: awake and alert Pain management: pain level controlled Vital Signs Assessment: post-procedure vital signs reviewed and stable Respiratory status: spontaneous breathing and respiratory function stable Cardiovascular status: stable Anesthetic complications: no   No complications documented.   Last Vitals:  Vitals:   02/03/20 1045 02/03/20 1058  BP: 136/89 125/90  Pulse: 86 69  Resp: 18 18  Temp:  36.4 C  SpO2: 94% 100%    Last Pain:  Vitals:   02/03/20 1045  PainSc: 2                  Christine [name] K

## 2020-02-03 NOTE — H&P (Signed)
Date of Initial H&P:01/30/2020  History reviewed, patient examined, no change in status, stable for surgery.

## 2020-02-03 NOTE — Interval H&P Note (Signed)
History and Physical Interval Note:  02/03/2020 7:32 AM  Christine [name]  has presented today for surgery, with the diagnosis of AUB.  The various methods of treatment have been discussed with the patient and family. After consideration of risks, benefits and other options for treatment, the patient has consented to  Procedure(s): TOTAL LAPAROSCOPIC HYSTERECTOMY WITH SALPINGECTOMY (Bilateral) CYSTOSCOPY (N/A) as a surgical intervention.  The patient's history has been reviewed, patient examined, no change in status, stable for surgery.  I have reviewed the patient's chart and labs.  Questions were answered to the patient's satisfaction.     Malachy Mood

## 2020-02-04 DIAGNOSIS — D251 Intramural leiomyoma of uterus: Secondary | ICD-10-CM | POA: Diagnosis not present

## 2020-02-04 LAB — SURGICAL PATHOLOGY

## 2020-02-04 LAB — BASIC METABOLIC PANEL
Anion gap: 9 (ref 5–15)
BUN: 11 mg/dL (ref 6–20)
CO2: 23 mmol/L (ref 22–32)
Calcium: 8.6 mg/dL — ABNORMAL LOW (ref 8.9–10.3)
Chloride: 106 mmol/L (ref 98–111)
Creatinine, Ser: 0.75 mg/dL (ref 0.44–1.00)
GFR, Estimated: >60 mL/min (ref >60)
Glucose, Bld: 160 mg/dL — ABNORMAL HIGH (ref 70–99)
Potassium: 4.2 mmol/L (ref 3.5–5.1)
Sodium: 138 mmol/L (ref 135–145)

## 2020-02-04 LAB — CBC
HCT: 35.1 % — ABNORMAL LOW (ref 36.0–46.0)
Hemoglobin: 11.8 g/dL — ABNORMAL LOW (ref 12.0–15.0)
MCH: 29.8 pg (ref 26.0–34.0)
MCHC: 33.6 g/dL (ref 30.0–36.0)
MCV: 88.6 fL (ref 80.0–100.0)
Platelets: 207 10*3/uL (ref 150–400)
RBC: 3.96 MIL/uL (ref 3.87–5.11)
RDW: 12.8 % (ref 11.5–15.5)
WBC: 22.1 10*3/uL — ABNORMAL HIGH (ref 4.0–10.5)
nRBC: 0 % (ref 0.0–0.2)

## 2020-02-04 MED ORDER — IBUPROFEN 600 MG PO TABS: 600.0000 mg | ORAL_TABLET | Freq: Four times a day (QID) | ORAL | 0 refills | Status: DC | PRN

## 2020-02-04 MED ORDER — OXYCODONE-ACETAMINOPHEN 5-325 MG PO TABS: 1.0000 | ORAL_TABLET | ORAL | 0 refills | Status: DC | PRN

## 2020-02-04 NOTE — Discharge Summary (Signed)
Physician Discharge Summary  Patient ID: Christine [name] MRN: [id number] DOB/AGE: [birth year] 49 y.o.  Admit date: 02/03/2020 Discharge date: 02/04/2020  Admission Diagnoses:  Discharge Diagnoses:  Active Problems:   Abnormal uterine bleeding (AUB)   S/P hysterectomy   Discharged Condition: stable  Hospital Course: 49 y.o. admitted for scheduled TLH, BS, cystoscopy for failed uterine ablation.  Procedure underwent without complications. The patient had her foley removed on postoperative day 0 and voided without difficulty.  By the morning of postoperative day one she was tolerating po, reported good pain control on po analgesics, and had ambulated.  The patient remained hemodynamically stable and afebrile throughout her admission.  Postoperative day 1 labs revealed a stabel H&H, mild leukocytosis appropriate in the setting of recent clean contaminated surgical procedure, and stable BUN & Cr.    Consults: None  Significant Diagnostic Studies:  Results for orders placed or performed during the hospital encounter of 02/03/20 (from the past 24 hour(s))  CBC     Status: Abnormal   Collection Time: 02/04/20  6:03 AM  Result Value Ref Range   WBC 22.1 (H) 4.0 - 10.5 K/uL   RBC 3.96 3.87 - 5.11 MIL/uL   Hemoglobin 11.8 (L) 12.0 - 15.0 g/dL   HCT 35.1 (L) 36 - 46 %   MCV 88.6 80.0 - 100.0 fL   MCH 29.8 26.0 - 34.0 pg   MCHC 33.6 30.0 - 36.0 g/dL   RDW 12.8 11.5 - 15.5 %   Platelets 207 150 - 400 K/uL   nRBC 0.0 0.0 - 0.2 %  Basic metabolic panel     Status: Abnormal   Collection Time: 02/04/20  6:03 AM  Result Value Ref Range   Sodium 138 135 - 145 mmol/L   Potassium 4.2 3.5 - 5.1 mmol/L   Chloride 106 98 - 111 mmol/L   CO2 23 22 - 32 mmol/L   Glucose, Bld 160 (H) 70 - 99 mg/dL   BUN 11 6 - 20 mg/dL   Creatinine, Ser 0.75 0.44 - 1.00 mg/dL   Calcium 8.6 (L) 8.9 - 10.3 mg/dL   GFR, Estimated >60 >60 mL/min   Anion gap 9 5 - 15     Treatments: surgery: TLH, BS, cystoscopy  02/04/2020  Discharge Exam: Blood pressure 114/63, pulse 64, temperature 97.8 F (36.6 C), temperature source Oral, resp. rate 20, height 5\' 7"  (1.702 m), weight 112.5 kg, SpO2 96 %. General appearance: alert, appears stated age and no distress Resp: no increased work of breathing GI: +BS, soft, appropriately tender around incisions, non-distended Incision/Wound:D/C/I trocar sites  Disposition: Discharge disposition: 01-Home or Self Care       Discharge Instructions    Call MD for:   Complete by: As directed    Heavy vaginal bleeding greater than 1 pad an hour   Call MD for:  difficulty breathing, headache or visual disturbances   Complete by: As directed    Call MD for:  extreme fatigue   Complete by: As directed    Call MD for:  hives   Complete by: As directed    Call MD for:  persistant dizziness or light-headedness   Complete by: As directed    Call MD for:  persistant nausea and vomiting   Complete by: As directed    Call MD for:  redness, tenderness, or signs of infection (pain, swelling, redness, odor or green/yellow discharge around incision site)   Complete by: As directed    Call MD for:  severe  uncontrolled pain   Complete by: As directed    Call MD for:  temperature >100.4   Complete by: As directed    Diet general   Complete by: As directed    Discharge wound care:   Complete by: As directed    You may apply a light dressing for minor discharge from the incision or to keep waistbands of clothing from rubbing.  You may shower, use soap on your incision.  Avoid baths or soaking the incision in the first 6 weeks following your surgery..   Driving restriction   Complete by: As directed    Avoid driving for at least 2 weeks or while taking prescription narcotics.   Lifting restrictions   Complete by: As directed    Weight restriction of 10lbs for 6 weeks.     Allergies as of 02/04/2020      Reactions   Zoloft [sertraline Hcl]    Unpleasant thoughts       Medication List    STOP taking these medications   GNP Naproxen Sodium 220 MG tablet Generic drug: naproxen sodium     TAKE these medications   baclofen 10 MG tablet Commonly known as: LIORESAL Take 0.5-1 tablets (5-10 mg total) by mouth 3 (three) times daily as needed for muscle spasms.   buPROPion 150 MG 24 hr tablet Commonly known as: WELLBUTRIN XL Take 150 mg by mouth daily.   cloNIDine 0.1 MG tablet Commonly known as: CATAPRES Take 1 tablet (0.1 mg total) by mouth at bedtime as needed (for hot flashes/sweats). What changed: when to take this   ibuprofen 600 MG tablet Commonly known as: ADVIL Take 1 tablet (600 mg total) by mouth every 6 (six) hours as needed.   oxyCODONE-acetaminophen 5-325 MG tablet Commonly known as: PERCOCET/ROXICET Take 1 tablet by mouth every 4 (four) hours as needed for moderate pain or severe pain.   promethazine 25 MG suppository Commonly known as: PHENERGAN Place 1 suppository (25 mg total) rectally every 6 (six) hours as needed for nausea or vomiting.   rizatriptan 10 MG disintegrating tablet Commonly known as: MAXALT-MLT Take 1 tablet (10 mg total) by mouth as needed for migraine. May repeat in 2 hours if needed   SUMAtriptan 6 MG/0.5ML Soaj Inject 6 mg SQ at onset of migraine, may repeat dose in 2 hours if not improved, Maximum dose: 6 mg per dose, 12 mg in 24 hours What changed:   how much to take  how to take this  when to take this            Discharge Care Instructions  (From admission, onward)         Start     Ordered   02/04/20 0000  Discharge wound care:       Comments: You may apply a light dressing for minor discharge from the incision or to keep waistbands of clothing from rubbing.  You may shower, use soap on your incision.  Avoid baths or soaking the incision in the first 6 weeks following your surgery..   02/04/20 6568          Follow-up Information    Malachy Mood, MD Follow up in 1 week(s).    Specialty: Obstetrics and Gynecology Why: postop Contact information: [address] [place] [address] [phone]               Signed: Malachy Mood 02/04/2020, 8:08 AM

## 2020-02-04 NOTE — Discharge Instructions (Signed)

## 2020-02-04 NOTE — Progress Notes (Signed)
Pt to be discharged home. Discharge instructions, prescriptions, and follow up appointments given to and reviewed with pt. Pt verbalized understanding. To be escorted by axillary.   

## 2020-02-05 NOTE — Telephone Encounter (Signed)
Pt calling; wasn't able to talk to AMS when he called; she is having a lot of pain and has hives; had surgery Tues.; please call back.  [phone]

## 2020-02-09 ENCOUNTER — Encounter: Payer: Self-pay | Admitting: Obstetrics and Gynecology

## 2020-02-09 ENCOUNTER — Other Ambulatory Visit: Payer: Self-pay

## 2020-02-09 ENCOUNTER — Ambulatory Visit (INDEPENDENT_AMBULATORY_CARE_PROVIDER_SITE_OTHER): Payer: 59 | Admitting: Obstetrics and Gynecology

## 2020-02-09 VITALS — BP 126/72 | Ht 66.0 in | Wt 249.0 lb

## 2020-02-09 DIAGNOSIS — Z4889 Encounter for other specified surgical aftercare: Secondary | ICD-10-CM

## 2020-02-09 NOTE — Progress Notes (Signed)
Postoperative Follow-up Patient presents post op from [place], BS, cystoscopy 1weeks ago for abnormal uterine bleeding.  Subjective: Patient reports marked improvement in her preop symptoms. Eating a regular diet without difficulty. Pain is controlled without any medications.  Activity: normal activities of daily living.  Objective: Blood pressure 126/72, height 5\' 6"  (1.676 m), weight 249 lb (112.9 kg).  General: NAD Pulmonary: no increased work of breathing Abdomen: soft, non-tender, non-distended, incision(s) D/C/I Extremities: no edema Neurologic: normal gait    Admission on 02/03/2020, Discharged on 02/04/2020  Component Date Value Ref Range Status  . ABO/RH(D) 02/03/2020    Final                   Value:A POS Performed at [facility], [address] [place],  [address]   . Preg Test, Ur 02/03/2020 Negative  Negative Final  . SURGICAL PATHOLOGY 02/03/2020    Final-Edited                   Value:SURGICAL PATHOLOGY CASE: [id number] PATIENT: [facility] Surgical Pathology Report     Specimen Submitted: A. Uterus, cervix, BI fallopian tubes  Clinical History: AUB      DIAGNOSIS: A. UTERUS, BILATERAL FALLOPIAN TUBES; HYSTERECTOMY AND BILATERAL SALPINGECTOMY: - BENIGN ECTOCERVICAL AND ENDOCERVICAL MUCOSA. - BENIGN PARTIALLY DENUDED WEAKLY PROLIFERATIVE ENDOMETRIUM. - ADENOMYOSIS. - INTRAMURAL LEIOMYOMA. - BILATERAL BENIGN FALLOPIAN TUBES. - BENIGN PARATUBAL CYSTS.  GROSS DESCRIPTION: A. Labeled: Uterus, cervix, bilateral fallopian tubes Received: In formalin Weight: 155 grams Dimensions:      Fundus -6.6 x 7 x 4.7 cm      Cervix -5 x 3.3 x 2.7 cm Serosa: Pink-red and relatively smooth Cervix: Pale pink and smooth Endocervix: The squamocolumnar junction is not distinct. Endometrial cavity:      Dimensions -approximately 4.2 x 1.2 x 0.3 cm      Thickness -0.1 cm      Other findings -portions of the endometrial cavity  appear contracted                          and have possibly of undergone a previous ablation Myometrium:     Thickness -2.7 cm     Other findings -within the myometrium is a single pink-white circumscribed bulging intramural nodule that measures 2.2 x 1.3 x 0.9 cm, which on sectioning has a whorled pattern.  The rest of the myometrium has a trabeculated appearance. Adnexa: The bilateral ovaries are not received      Right fallopian tube           Measurements -7.8 cm in length and ranges from 0.3 to 0.7 cm in diameter           Other findings -the fallopian tube appears to be previously ablated, and shows a large paratubal cyst that measures up to 1.8 cm in diameter, containing clear fluid.  There are several smaller paratubal cysts from 0.1 to 0.3 cm in diameter, containing yellowish fluid.  On sectioning portions of the tube expel viscous brown fluid      Left fallopian tube            Measurements -6 cm in length and ranges from 0.3 to 0.7 cm in diameter           Other findings -the fallopian tube appear  s to be previously ligated, and the serosal surface is studded by several paratubal cysts from 0.1 to 0.5 cm in diameter, containing clear fluid. Other comments: None  Block summary: 1-2-cervix 3-6-endomyometrium including areas of possible previous ablation 7-myometrial nodule 8-9-entire longitudinally sectioned fimbriated end of right fallopian tube and representative cross-sections 10-11-entire longitudinally sectioned fimbriated end of left fallopian tube and representative cross-sections   Final Diagnosis performed by Raynelle Bring, MD.   Electronically signed 02/04/2020 9:14:14AM The electronic signature indicates that the named Attending Pathologist has evaluated the specimen Technical component performed at [facility], [address] [place], [place] [address] Lab: [phone] Dir: Rush Farmer, MD, MMM  Professional component performed at  [facility], [facility], [place], [place], [place] [address] Lab: [phone] Dir: Dellia Nims. Caleen Essex, MD   . WBC 02/04/2020 22.1* 4.0 - 10.5 K/uL Final  . RBC 02/04/2020 3.96  3.87 - 5.11 MIL/uL Final  . Hemoglobin 02/04/2020 11.8* 12.0 - 15.0 g/dL Final  . HCT 02/04/2020 35.1* 36.0 - 46.0 % Final  . MCV 02/04/2020 88.6  80.0 - 100.0 fL Final  . MCH 02/04/2020 29.8  26.0 - 34.0 pg Final  . MCHC 02/04/2020 33.6  30.0 - 36.0 g/dL Final  . RDW 02/04/2020 12.8  11.5 - 15.5 % Final  . Platelets 02/04/2020 207  150 - 400 K/uL Final  . nRBC 02/04/2020 0.0  0.0 - 0.2 % Final   Performed at [facility], [address], [place], [place] [address]  . Sodium 02/04/2020 138  135 - 145 mmol/L Final  . Potassium 02/04/2020 4.2  3.5 - 5.1 mmol/L Final  . Chloride 02/04/2020 106  98 - 111 mmol/L Final  . CO2 02/04/2020 23  22 - 32 mmol/L Final  . Glucose, Bld 02/04/2020 160* 70 - 99 mg/dL Final   Glucose reference range applies only to samples taken after fasting for at least 8 hours.  . BUN 02/04/2020 11  6 - 20 mg/dL Final  . Creatinine, Ser 02/04/2020 0.75  0.44 - 1.00 mg/dL Final  . Calcium 02/04/2020 8.6* 8.9 - 10.3 mg/dL Final  . GFR, Estimated 02/04/2020 >60  >60 mL/min Final   Comment: (NOTE) Calculated using the CKD-EPI Creatinine Equation (2021)   . Anion gap 02/04/2020 9  5 - 15 Final   Performed at [facility], [address] [place],  [address]    Assessment: 49 y.o. s/p TLH, BS, cystoscopy stable  Plan: Patient has done well after surgery with no apparent complications.  I have discussed the post-operative course to date, and the expected progress moving forward.  The patient understands what complications to be concerned about.  I will see the patient in routine follow up, or sooner if needed.    Activity plan: No heavy lifting.  Return in about 5 weeks (around 03/15/2020) for postop  visit.   Malachy Mood, MD, [facility], [facility] 02/09/2020, 4:40 PM

## 2020-02-18 ENCOUNTER — Other Ambulatory Visit: Payer: Self-pay

## 2020-02-19 ENCOUNTER — Telehealth: Payer: Self-pay

## 2020-02-19 ENCOUNTER — Other Ambulatory Visit: Payer: Self-pay | Admitting: Obstetrics and Gynecology

## 2020-02-19 MED ORDER — OXYCODONE-ACETAMINOPHEN 5-325 MG PO TABS: 1.0000 | ORAL_TABLET | ORAL | 0 refills | Status: DC | PRN

## 2020-02-19 NOTE — Telephone Encounter (Signed)
Pt calling for refill of percocet.  Pharm adv her to call us.  Still has pain in stomach area; mainly wants rx to help her sleep; states AMS told her ot let him know if she needed a refill.  [phone]

## 2020-02-19 NOTE — Telephone Encounter (Signed)
Rx has been sent  

## 2020-02-19 NOTE — Telephone Encounter (Signed)
Pt aware.

## 2020-02-26 NOTE — Addendum Note (Signed)
Addended by: Lorrene Reid on: 02/26/2020 08:04 AM   Modules accepted: Level of Service

## 2020-03-15 ENCOUNTER — Ambulatory Visit: Payer: 59 | Admitting: Obstetrics and Gynecology

## 2020-03-17 ENCOUNTER — Telehealth: Payer: Self-pay

## 2020-03-17 NOTE — Telephone Encounter (Signed)
Spoke w/patient. Advised AMS out of office today and would need to sign paper. She states it is the same form 1002E we filled out prior. They just need the date extended. She works Mon-Thurs and would not return now until 03/22/20. Advised could print form already completed and do a single cross thru with return to work as 03/22/20 d/t weather closing. Fax to [employer] [phone]

## 2020-03-17 NOTE — Telephone Encounter (Signed)
Patient has hysterectomy with AMS 02/03/20. She was scheduled to return to work 03/15/20 and had apt w/AMS 03/15/20 & office was closed. She has apt tomorrow 03/18/20 and they are calling for more snow. Her work has sent her another form to be filled out extending her time off. Inquiring if she can email it and have it filled out and faxed to her job. She doesn't want to loose her job. She's afraid d/t predicted weather that her appointment tomorrow may be cancelled also. [phone]

## 2020-03-18 ENCOUNTER — Ambulatory Visit (INDEPENDENT_AMBULATORY_CARE_PROVIDER_SITE_OTHER): Payer: 59 | Admitting: Obstetrics and Gynecology

## 2020-03-18 ENCOUNTER — Encounter: Payer: Self-pay | Admitting: Obstetrics and Gynecology

## 2020-03-18 ENCOUNTER — Other Ambulatory Visit: Payer: Self-pay

## 2020-03-18 VITALS — BP 151/84 | Ht 66.0 in | Wt 254.1 lb

## 2020-03-18 DIAGNOSIS — Z4889 Encounter for other specified surgical aftercare: Secondary | ICD-10-CM

## 2020-03-18 NOTE — Progress Notes (Signed)
Postoperative Follow-up Patient presents post op from [place], BS, cystoscopy 6weeks ago for abnormal uterine bleeding (adenomyosis).  Subjective: Patient reports marked improvement in her preop symptoms. Eating a regular diet without difficulty. Pain is controlled without any medications.  Activity: normal activities of daily living.  Objective: Blood pressure (!) 151/84, height 5\' 6"  (1.676 m), weight 254 lb 2 oz (115.3 kg).  General: NAD Pulmonary: no increased work of breathing Abdomen: soft, non-tender, non-distended, incision(s) D/C/I GU: normal external female genitalia vaginal cuff intact, well healed Extremities: no edema Neurologic: normal gait    Admission on 02/03/2020, Discharged on 02/04/2020  Component Date Value Ref Range Status  . ABO/RH(D) 02/03/2020    Final                   Value:A POS Performed at [facility], [address] [place], [place] [address]   . Preg Test, Ur 02/03/2020 Negative  Negative Final  . SURGICAL PATHOLOGY 02/03/2020    Final-Edited                   Value:SURGICAL PATHOLOGY CASE: [id number] PATIENT: [facility] Surgical Pathology Report     Specimen Submitted: A. Uterus, cervix, BI fallopian tubes  Clinical History: AUB      DIAGNOSIS: A. UTERUS, BILATERAL FALLOPIAN TUBES; HYSTERECTOMY AND BILATERAL SALPINGECTOMY: - BENIGN ECTOCERVICAL AND ENDOCERVICAL MUCOSA. - BENIGN PARTIALLY DENUDED WEAKLY PROLIFERATIVE ENDOMETRIUM. - ADENOMYOSIS. - INTRAMURAL LEIOMYOMA. - BILATERAL BENIGN FALLOPIAN TUBES. - BENIGN PARATUBAL CYSTS.  GROSS DESCRIPTION: A. Labeled: Uterus, cervix, bilateral fallopian tubes Received: In formalin Weight: 155 grams Dimensions:      Fundus -6.6 x 7 x 4.7 cm      Cervix -5 x 3.3 x 2.7 cm Serosa: Pink-red and relatively smooth Cervix: Pale pink and smooth Endocervix: The squamocolumnar junction is not distinct. Endometrial cavity:      Dimensions -approximately 4.2 x 1.2 x  0.3 cm      Thickness -0.1 cm      Other findings -portions of the endometrial cavity appear contracted                          and have possibly of undergone a previous ablation Myometrium:     Thickness -2.7 cm     Other findings -within the myometrium is a single pink-white circumscribed bulging intramural nodule that measures 2.2 x 1.3 x 0.9 cm, which on sectioning has a whorled pattern.  The rest of the myometrium has a trabeculated appearance. Adnexa: The bilateral ovaries are not received      Right fallopian tube           Measurements -7.8 cm in length and ranges from 0.3 to 0.7 cm in diameter           Other findings -the fallopian tube appears to be previously ablated, and shows a large paratubal cyst that measures up to 1.8 cm in diameter, containing clear fluid.  There are several smaller paratubal cysts from 0.1 to 0.3 cm in diameter, containing yellowish fluid.  On sectioning portions of the tube expel viscous brown fluid      Left fallopian tube            Measurements -6 cm in length and ranges from 0.3 to 0.7 cm in diameter           Other findings -the fallopian tube appear  s to be previously ligated, and the serosal surface is studded by several paratubal cysts from 0.1 to 0.5 cm in diameter, containing clear fluid. Other comments: None  Block summary: 1-2-cervix 3-6-endomyometrium including areas of possible previous ablation 7-myometrial nodule 8-9-entire longitudinally sectioned fimbriated end of right fallopian tube and representative cross-sections 10-11-entire longitudinally sectioned fimbriated end of left fallopian tube and representative cross-sections   Final Diagnosis performed by Raynelle Bring, MD.   Electronically signed 02/04/2020 9:14:14AM The electronic signature indicates that the named Attending Pathologist has evaluated the specimen Technical component performed at [facility], [address] [place], [place] [address]  Lab: [phone] Dir: Rush Farmer, MD, MMM  Professional component performed at [facility], [facility], [place], [place], [place] [address] Lab: [phone] Dir: Dellia Nims. Caleen Essex, MD   . WBC 02/04/2020 22.1* 4.0 - 10.5 K/uL Final  . RBC 02/04/2020 3.96  3.87 - 5.11 MIL/uL Final  . Hemoglobin 02/04/2020 11.8* 12.0 - 15.0 g/dL Final  . HCT 02/04/2020 35.1* 36.0 - 46.0 % Final  . MCV 02/04/2020 88.6  80.0 - 100.0 fL Final  . MCH 02/04/2020 29.8  26.0 - 34.0 pg Final  . MCHC 02/04/2020 33.6  30.0 - 36.0 g/dL Final  . RDW 02/04/2020 12.8  11.5 - 15.5 % Final  . Platelets 02/04/2020 207  150 - 400 K/uL Final  . nRBC 02/04/2020 0.0  0.0 - 0.2 % Final   Performed at [facility], [address] [place], [place] [address]  . Sodium 02/04/2020 138  135 - 145 mmol/L Final  . Potassium 02/04/2020 4.2  3.5 - 5.1 mmol/L Final  . Chloride 02/04/2020 106  98 - 111 mmol/L Final  . CO2 02/04/2020 23  22 - 32 mmol/L Final  . Glucose, Bld 02/04/2020 160* 70 - 99 mg/dL Final   Glucose reference range applies only to samples taken after fasting for at least 8 hours.  . BUN 02/04/2020 11  6 - 20 mg/dL Final  . Creatinine, Ser 02/04/2020 0.75  0.44 - 1.00 mg/dL Final  . Calcium 02/04/2020 8.6* 8.9 - 10.3 mg/dL Final  . GFR, Estimated 02/04/2020 >60  >60 mL/min Final   Comment: (NOTE) Calculated using the CKD-EPI Creatinine Equation (2021)   . Anion gap 02/04/2020 9  5 - 15 Final   Performed at [facility], [address] [place], [place] [address]    Assessment: 50 y.o. s/p TLH, BS, cystoscopy stable  Plan: Patient has done well after surgery with no apparent complications.  I have discussed the post-operative course to date, and the expected progress moving forward.  The patient understands what complications to be concerned about.  I will see the patient in routine follow up, or sooner if needed.    Activity plan: No  restriction.   Malachy Mood, MD, [facility], [facility] 03/18/2020, 2:16 PM

## 2020-03-23 NOTE — Telephone Encounter (Signed)
FMLA papers updated w/new date & forms faxed on 03/18/20 by Truitt Leep.

## 2020-03-26 ENCOUNTER — Other Ambulatory Visit: Payer: Self-pay | Admitting: Family Medicine

## 2020-03-26 DIAGNOSIS — N951 Menopausal and female climacteric states: Secondary | ICD-10-CM

## 2020-03-26 DIAGNOSIS — M79604 Pain in right leg: Secondary | ICD-10-CM

## 2020-03-26 DIAGNOSIS — M79605 Pain in left leg: Secondary | ICD-10-CM

## 2020-04-16 ENCOUNTER — Ambulatory Visit: Payer: 59 | Admitting: Family Medicine

## 2020-04-26 ENCOUNTER — Other Ambulatory Visit: Payer: Self-pay | Admitting: Obstetrics and Gynecology

## 2020-04-26 NOTE — Telephone Encounter (Signed)
Please advise 

## 2020-06-07 ENCOUNTER — Other Ambulatory Visit: Payer: Self-pay | Admitting: Obstetrics and Gynecology

## 2020-06-07 MED ORDER — METRONIDAZOLE 500 MG PO TABS: 500.0000 mg | ORAL_TABLET | Freq: Two times a day (BID) | ORAL | 0 refills | Status: AC

## 2020-06-07 NOTE — Telephone Encounter (Signed)
Pt called regarding request for rx for flagyl.  [phone]

## 2020-06-09 NOTE — Telephone Encounter (Signed)
Pt aware.

## 2020-07-14 ENCOUNTER — Telehealth: Payer: Self-pay

## 2020-07-14 NOTE — Telephone Encounter (Signed)
Patient reports she had a hysterectomy w/Dr. Georgianne Fick 01/2020. Yesterday she experienced pain in her belly button. It is worse today with shooting pains in her left side. Inquiring if this could be related to her hysterectomy? [phone]

## 2020-07-14 NOTE — Telephone Encounter (Signed)
Spoke w/patient. She reports pain at her belly button where her pants sit. She thought her pants were irritating her. She pulled them down and it has continued to bother her. The left side pain she states is not abdominal, it's flank pain. She denies fever/chills, UTI s&s. Advised likely not related to hysterectomy. Message to AMS for review. Patient to report to urgent care or ED if pain not tolerated with tylenol/ibuprofen.

## 2020-10-05 ENCOUNTER — Encounter: Payer: Self-pay | Admitting: Family Medicine

## 2020-10-05 ENCOUNTER — Telehealth: Payer: Self-pay

## 2020-10-05 NOTE — Telephone Encounter (Signed)
Pt is scheduled for Friday 10/08/20, pts choice

## 2020-10-06 ENCOUNTER — Other Ambulatory Visit: Payer: Self-pay | Admitting: Student

## 2020-10-06 DIAGNOSIS — G43719 Chronic migraine without aura, intractable, without status migrainosus: Secondary | ICD-10-CM

## 2020-10-08 ENCOUNTER — Encounter: Payer: Self-pay | Admitting: Family Medicine

## 2020-10-08 ENCOUNTER — Other Ambulatory Visit: Payer: Self-pay

## 2020-10-08 ENCOUNTER — Other Ambulatory Visit (HOSPITAL_COMMUNITY)
Admission: RE | Admit: 2020-10-08 | Discharge: 2020-10-08 | Disposition: A | Discharge status: Home / Self Care | Payer: 59 | Source: Ambulatory Visit | Attending: Family Medicine | Admitting: Family Medicine

## 2020-10-08 ENCOUNTER — Ambulatory Visit: Payer: 59 | Admitting: Family Medicine

## 2020-10-08 VITALS — BP 124/82 | HR 97 | Temp 98.2°F | Resp 18 | Ht 66.0 in | Wt 252.7 lb

## 2020-10-08 DIAGNOSIS — F330 Major depressive disorder, recurrent, mild: Secondary | ICD-10-CM

## 2020-10-08 DIAGNOSIS — N3001 Acute cystitis with hematuria: Secondary | ICD-10-CM | POA: Diagnosis not present

## 2020-10-08 DIAGNOSIS — Z90710 Acquired absence of both cervix and uterus: Secondary | ICD-10-CM | POA: Diagnosis not present

## 2020-10-08 DIAGNOSIS — N760 Acute vaginitis: Secondary | ICD-10-CM | POA: Diagnosis present

## 2020-10-08 DIAGNOSIS — Z6841 Body Mass Index (BMI) 40.0 and over, adult: Secondary | ICD-10-CM | POA: Insufficient documentation

## 2020-10-08 DIAGNOSIS — B9689 Other specified bacterial agents as the cause of diseases classified elsewhere: Secondary | ICD-10-CM

## 2020-10-08 DIAGNOSIS — G43909 Migraine, unspecified, not intractable, without status migrainosus: Secondary | ICD-10-CM | POA: Diagnosis not present

## 2020-10-08 DIAGNOSIS — N951 Menopausal and female climacteric states: Secondary | ICD-10-CM | POA: Insufficient documentation

## 2020-10-08 LAB — POCT URINALYSIS DIPSTICK
Bilirubin, UA: NEGATIVE
Glucose, UA: NEGATIVE
Ketones, UA: NEGATIVE
Nitrite, UA: NEGATIVE
Protein, UA: POSITIVE — AB
Spec Grav, UA: 1.02 (ref 1.010–1.025)
Urobilinogen, UA: 0.2 E.U./dL
pH, UA: 5 (ref 5.0–8.0)

## 2020-10-08 MED ORDER — RIZATRIPTAN BENZOATE 10 MG PO TBDP: 10.0000 mg | ORAL_TABLET | ORAL | 5 refills | Status: DC | PRN

## 2020-10-08 MED ORDER — NITROFURANTOIN MONOHYD MACRO 100 MG PO CAPS: 100.0000 mg | ORAL_CAPSULE | Freq: Two times a day (BID) | ORAL | 0 refills | Status: DC

## 2020-10-08 MED ORDER — METRONIDAZOLE 500 MG PO TABS
500.0000 mg | ORAL_TABLET | Freq: Two times a day (BID) | ORAL | 0 refills | Status: AC
Start: 2020-10-08 — End: 2020-10-15

## 2020-10-08 MED ORDER — BUPROPION HCL ER (XL) 150 MG PO TB24: 150.0000 mg | ORAL_TABLET | Freq: Every day | ORAL | 3 refills | Status: DC

## 2020-10-08 NOTE — Progress Notes (Signed)
yellow 

## 2020-10-08 NOTE — Progress Notes (Signed)
Patient ID: [name], female    DOB: 06/17/1970, 50 y.o.   MRN: [id number]  PCP: Delsa Grana, PA-C  Chief Complaint  Patient presents with   Urinary Tract Infection   Vaginal Discharge    Subjective:   Christine [name] is a 50 y.o. female, presents to clinic with CC of the following:  Urinary Tract Infection  This is a recurrent problem. The current episode started in the past 7 days. The problem occurs every urination. The problem has been unchanged. The quality of the pain is described as burning. There has been no fever. She is Sexually active. There is No history of pyelonephritis. Associated symptoms include flank pain, frequency, hesitancy and urgency. Pertinent negatives include no chills, discharge, hematuria, nausea, possible pregnancy, sweats or vomiting. She has tried nothing for the symptoms. The treatment provided no relief. Her past medical history is significant for recurrent UTIs.  Vaginal Discharge The patient's pertinent negatives include no vaginal discharge. Primary symptoms comment: She reports symptoms consistent with BV. This is a recurrent problem. The current episode started in the past 7 days. The problem has been unchanged. Associated symptoms include dysuria, flank pain, frequency and urgency. Pertinent negatives include no abdominal pain, anorexia, back pain, chills, fever, hematuria, joint swelling, nausea, painful intercourse, rash, sore throat or vomiting. She has tried nothing for the symptoms. She is sexually active. No, her partner does not have an STD. She uses hysterectomy for contraception. Her past medical history is significant for vaginosis.    Results for orders placed or performed in visit on 10/08/20  POCT urinalysis dipstick  Result Value Ref Range   Color, UA yellow    Clarity, UA clear    Glucose, UA Negative Negative   Bilirubin, UA negative    Ketones, UA negative    Spec Grav, UA 1.020 1.010 - 1.025   Blood, UA large    pH, UA  5.0 5.0 - 8.0   Protein, UA Positive (A) Negative   Urobilinogen, UA 0.2 0.2 or 1.0 E.U./dL   Nitrite, UA negative    Leukocytes, UA Trace (A) Negative   Appearance clear    Odor none      Patient Active Problem List   Diagnosis Date Noted   S/P hysterectomy 02/03/2020   Abnormal uterine bleeding (AUB)    Migraine without status migrainosus, not intractable 01/09/2020   Tobacco abuse 04/15/2018   Preventative health care 02/09/2018   Obesity (BMI 30.0-34.9) 02/09/2018   Multiple personality disorder ([place]) 01/03/2018   Mild episode of recurrent major depressive disorder ([place]) 01/03/2018   Generalized anxiety disorder 01/03/2018   Menorrhagia with irregular cycle 11/19/2013      Current Outpatient Medications:    buPROPion (WELLBUTRIN XL) 150 MG 24 hr tablet, Take 150 mg by mouth daily. , Disp: , Rfl:    cloNIDine (CATAPRES) 0.1 MG tablet, Take 1 tablet (0.1 mg total) by mouth at bedtime as needed (for hot flashes/sweats)., Disp: 30 tablet, Rfl: 11   promethazine (PHENERGAN) 25 MG suppository, Place 1 suppository (25 mg total) rectally every 6 (six) hours as needed for nausea or vomiting., Disp: 6 each, Rfl: 1   rizatriptan (MAXALT-MLT) 10 MG disintegrating tablet, Take 1 tablet (10 mg total) by mouth as needed for migraine. May repeat in 2 hours if needed, Disp: 10 tablet, Rfl: 2   baclofen (LIORESAL) 10 MG tablet, TAKE 1/2 TO 1 TABLET BY MOUTH THREE TIMES DAILY AS NEEDED FOR MUSCLE SPASM, Disp: 60 tablet, Rfl: 0  ibuprofen (ADVIL) 600 MG tablet, TAKE (1) TABLET BY MOUTH EVERY SIX HOURS AS NEEDED., Disp: 40 tablet, Rfl: 5   oxyCODONE-acetaminophen (PERCOCET/ROXICET) 5-325 MG tablet, Take 1 tablet by mouth every 4 (four) hours as needed for moderate pain or severe pain. (Patient not taking: Reported on 03/18/2020), Disp: 20 tablet, Rfl: 0   SUMAtriptan 6 MG/0.5ML SOAJ, Inject 6 mg SQ at onset of migraine, may repeat dose in 2 hours if not improved, Maximum dose: 6 mg per dose, 12 mg  in 24 hours (Patient not taking: Reported on 10/08/2020), Disp: 6 mL, Rfl: 1   Allergies  Allergen Reactions   Zoloft [Sertraline Hcl]     Unpleasant thoughts     Social History   Tobacco Use   Smoking status: Every Day    Packs/day: 0.25    Years: 30.00    Pack years: 7.50    Types: Cigarettes   Smokeless tobacco: Never   Tobacco comments:    Heavy smoker for most of her years of smoking  Vaping Use   Vaping Use: Never used  Substance Use Topics   Alcohol use: No   Drug use: No      Chart Review Today: I personally reviewed active problem list, medication list, allergies, family history, social history, health maintenance, notes from last encounter, lab results, imaging with the patient/caregiver today.   Review of Systems  Constitutional: Negative.  Negative for chills and fever.  HENT: Negative.  Negative for sore throat.   Eyes: Negative.   Respiratory: Negative.    Cardiovascular: Negative.   Gastrointestinal: Negative.  Negative for abdominal pain, anorexia, nausea and vomiting.  Endocrine: Negative.   Genitourinary:  Positive for dysuria, flank pain, frequency, hesitancy and urgency. Negative for hematuria and vaginal discharge.  Musculoskeletal:  Negative for back pain.  Skin: Negative.  Negative for rash.  Allergic/Immunologic: Negative.   Neurological: Negative.   Hematological: Negative.   Psychiatric/Behavioral: Negative.    All other systems reviewed and are negative.     Objective:   Vitals:   10/08/20 1315  BP: 124/82  Pulse: 97  Resp: 18  Temp: 98.2 F (36.8 C)  TempSrc: Oral  SpO2: 98%  Weight: 252 lb 11.2 oz (114.6 kg)  Height: '5\' 6"'$  (1.676 m)    Body mass index is 40.79 kg/m.  Physical Exam Vitals and nursing note reviewed.  Constitutional:      General: She is not in acute distress.    Appearance: Normal appearance. She is obese. She is not ill-appearing, toxic-appearing or diaphoretic.  HENT:     Head: Normocephalic and  atraumatic.     Right Ear: External ear normal.     Left Ear: External ear normal.  Eyes:     General:        Right eye: No discharge.        Left eye: No discharge.     Conjunctiva/sclera: Conjunctivae normal.  Cardiovascular:     Rate and Rhythm: Normal rate and regular rhythm.     Pulses: Normal pulses.     Heart sounds: Normal heart sounds.  Pulmonary:     Effort: Pulmonary effort is normal.     Breath sounds: Normal breath sounds.  Abdominal:     General: Bowel sounds are normal. There is no distension.     Tenderness: There is no abdominal tenderness. There is no right CVA tenderness, left CVA tenderness, guarding or rebound.  Skin:    General: Skin is warm and dry.  Capillary Refill: Capillary refill takes less than 2 seconds.     Coloration: Skin is not jaundiced or pale.     Findings: No lesion or rash.  Neurological:     Mental Status: She is alert. Mental status is at baseline.     Gait: Gait normal.  Psychiatric:        Mood and Affect: Mood normal.     Results for orders placed or performed in visit on 10/08/20  POCT urinalysis dipstick  Result Value Ref Range   Color, UA yellow    Clarity, UA clear    Glucose, UA Negative Negative   Bilirubin, UA negative    Ketones, UA negative    Spec Grav, UA 1.020 1.010 - 1.025   Blood, UA large    pH, UA 5.0 5.0 - 8.0   Protein, UA Positive (A) Negative   Urobilinogen, UA 0.2 0.2 or 1.0 E.U./dL   Nitrite, UA negative    Leukocytes, UA Trace (A) Negative   Appearance clear    Odor none        Assessment & Plan:     ICD-10-CM   1. Acute cystitis with hematuria  N30.01 POCT urinalysis dipstick    Urine Culture    nitrofurantoin, macrocrystal-monohydrate, (MACROBID) 100 MG capsule   Urinary symptoms with urgency and dysuria, UA suggestive of possible UTI, will start treatment with antibiotic and follow culture    2. BV (bacterial vaginosis)  N76.0 Cervicovaginal ancillary only   B96.89 metroNIDAZOLE  (FLAGYL) 500 MG tablet   Patient also endorses symptoms of BV is very sure that this is consistent with her recurrent BV and she asked for Flagyl    3. Migraine without status migrainosus, not intractable, unspecified migraine type  G43.909 rizatriptan (MAXALT-MLT) 10 MG disintegrating tablet   Refill on medications, she is now seeing neurology who is pending new med approval    4. S/P hysterectomy  Z90.710     5. Mild episode of recurrent major depressive disorder (HCC)  F33.0 buPROPion (WELLBUTRIN XL) 150 MG 24 hr tablet      Tx for simple cystitis and for BV per patient request will follow results Did discuss the possibility of atrophic vaginitis causing symptoms and option to do conjugated estrogen creams Abdominal exam benign, patient well-appearing, VSS, low risk sexual history monogamous with husband only    Delsa Grana, PA-C 10/08/20 1:38 PM

## 2020-10-10 LAB — URINE CULTURE
MICRO NUMBER:: 12236558
SPECIMEN QUALITY:: ADEQUATE

## 2020-10-11 ENCOUNTER — Other Ambulatory Visit: Payer: Self-pay | Admitting: Family Medicine

## 2020-10-11 MED ORDER — SULFAMETHOXAZOLE-TRIMETHOPRIM 800-160 MG PO TABS: 1.0000 | ORAL_TABLET | Freq: Two times a day (BID) | ORAL | 0 refills | Status: AC

## 2020-10-12 LAB — CERVICOVAGINAL ANCILLARY ONLY
Bacterial Vaginitis (gardnerella): POSITIVE — AB
Candida Glabrata: NEGATIVE
Candida Vaginitis: NEGATIVE
Comment: NEGATIVE
Comment: NEGATIVE
Comment: NEGATIVE

## 2020-10-13 ENCOUNTER — Ambulatory Visit: Payer: 59

## 2020-10-18 ENCOUNTER — Encounter: Payer: Self-pay | Admitting: Family Medicine

## 2020-10-20 ENCOUNTER — Ambulatory Visit: Payer: 59 | Admitting: Family Medicine

## 2020-10-20 ENCOUNTER — Encounter: Payer: Self-pay | Admitting: Family Medicine

## 2020-10-20 ENCOUNTER — Other Ambulatory Visit: Payer: Self-pay

## 2020-10-20 ENCOUNTER — Other Ambulatory Visit (HOSPITAL_COMMUNITY)
Admission: RE | Admit: 2020-10-20 | Discharge: 2020-10-20 | Disposition: A | Discharge status: Home / Self Care | Payer: 59 | Source: Ambulatory Visit | Attending: Family Medicine | Admitting: Family Medicine

## 2020-10-20 VITALS — BP 130/84 | HR 95 | Temp 98.7°F | Resp 16 | Ht 66.0 in | Wt 252.1 lb

## 2020-10-20 DIAGNOSIS — R739 Hyperglycemia, unspecified: Secondary | ICD-10-CM

## 2020-10-20 DIAGNOSIS — B9689 Other specified bacterial agents as the cause of diseases classified elsewhere: Secondary | ICD-10-CM

## 2020-10-20 DIAGNOSIS — N760 Acute vaginitis: Secondary | ICD-10-CM | POA: Diagnosis present

## 2020-10-20 DIAGNOSIS — R8290 Unspecified abnormal findings in urine: Secondary | ICD-10-CM | POA: Insufficient documentation

## 2020-10-20 DIAGNOSIS — R631 Polydipsia: Secondary | ICD-10-CM | POA: Diagnosis not present

## 2020-10-20 DIAGNOSIS — D649 Anemia, unspecified: Secondary | ICD-10-CM

## 2020-10-20 DIAGNOSIS — M545 Low back pain, unspecified: Secondary | ICD-10-CM | POA: Diagnosis not present

## 2020-10-20 DIAGNOSIS — R350 Frequency of micturition: Secondary | ICD-10-CM

## 2020-10-20 LAB — POCT URINALYSIS DIPSTICK
Bilirubin, UA: NEGATIVE
Blood, UA: POSITIVE
Glucose, UA: NEGATIVE
Ketones, UA: NEGATIVE
Leukocytes, UA: NEGATIVE
Nitrite, UA: NEGATIVE
Protein, UA: NEGATIVE
Spec Grav, UA: 1.01 (ref 1.010–1.025)
Urobilinogen, UA: 0.2 E.U./dL
pH, UA: 5 (ref 5.0–8.0)

## 2020-10-20 NOTE — Progress Notes (Signed)
Patient ID: Christine [name], female    DOB: [birth year], 50 y.o.   MRN: [id number]  PCP: Delsa Grana, PA-C  Chief Complaint  Patient presents with   Urinary Tract Infection    Pt was here 2 weeks ago was on a antibiotic still has symptoms which include fouls smell, frequency and back pain.    Subjective:   Christine [name] is a 50 y.o. female, presents to clinic with CC of the following:  HPI   Results for orders placed or performed in visit on 10/08/20  Urine Culture   Specimen: Urine  Result Value Ref Range   MICRO NUMBER: [id number]    SPECIMEN QUALITY: Adequate    Sample Source URINE, CLEAN CATCH    STATUS: FINAL    ISOLATE 1: Enterobacter aerogenes (A)       Susceptibility   Enterobacter aerogenes - URINE CULTURE, REFLEX    AMOX/CLAVULANIC 16 Resistant     CEFAZOLIN* >=64 Resistant      * For uncomplicated UTI caused by E. coli, K. pneumoniae or P. mirabilis: Cefazolin is susceptible if MIC <32 mcg/mL and predicts susceptible to the oral agents cefaclor, cefdinir, cefpodoxime, cefprozil, cefuroxime, cephalexin and loracarbef.     CEFTAZIDIME <=1 Sensitive     CEFEPIME <=1 Sensitive     CEFTRIAXONE <=1 Sensitive     CIPROFLOXACIN <=0.25 Sensitive     LEVOFLOXACIN <=0.12 Sensitive     GENTAMICIN <=1 Sensitive     IMIPENEM 1 Sensitive     NITROFURANTOIN 128 Resistant     PIP/TAZO <=4 Sensitive     TOBRAMYCIN <=1 Sensitive     TRIMETH/SULFA* <=20 Sensitive      * For uncomplicated UTI caused by E. coli, K. pneumoniae or P. mirabilis: Cefazolin is susceptible if MIC <32 mcg/mL and predicts susceptible to the oral agents cefaclor, cefdinir, cefpodoxime, cefprozil, cefuroxime, cephalexin and loracarbef. Legend: S = Susceptible  I = Intermediate R = Resistant  NS = Not susceptible * = Not tested  NR = Not reported **NN = See antimicrobic comments   POCT urinalysis dipstick  Result Value Ref Range   Color, UA yellow    Clarity, UA clear    Glucose, UA  Negative Negative   Bilirubin, UA negative    Ketones, UA negative    Spec Grav, UA 1.020 1.010 - 1.025   Blood, UA large    pH, UA 5.0 5.0 - 8.0   Protein, UA Positive (A) Negative   Urobilinogen, UA 0.2 0.2 or 1.0 E.U./dL   Nitrite, UA negative    Leukocytes, UA Trace (A) Negative   Appearance clear    Odor none   Cervicovaginal ancillary only  Result Value Ref Range   Candida Vaginitis Negative    Candida Glabrata Negative    Bacterial Vaginitis (gardnerella) Positive (A)    Comment Normal Reference Range Candida Species - Negative    Comment Normal Reference Range Candida Galbrata - Negative    Comment      Normal Reference Range Bacterial Vaginosis - Negative   Still having urinary sx odor, urgency, trouble holding, still back pain No hematuria Dark color and foul urine in am and gets lighter throughout the day  Denies fever, chills, N, V, abd pain   Patient Active Problem List   Diagnosis Date Noted   Perimenopausal vasomotor symptoms 10/08/2020   Class 3 severe obesity with body mass index (BMI) of 40.0 to 44.9 in adult [facility]) 10/08/2020   S/P hysterectomy 02/03/2020  Migraine without status migrainosus, not intractable 01/09/2020   Tobacco use disorder 04/15/2018   Multiple personality disorder ([place]) 01/03/2018   Mild episode of recurrent major depressive disorder ([place]) 01/03/2018   Generalized anxiety disorder 01/03/2018      Current Outpatient Medications:    buPROPion (WELLBUTRIN XL) 150 MG 24 hr tablet, Take 1 tablet (150 mg total) by mouth daily., Disp: 90 tablet, Rfl: 3   cloNIDine (CATAPRES) 0.1 MG tablet, Take 1 tablet (0.1 mg total) by mouth at bedtime as needed (for hot flashes/sweats)., Disp: 30 tablet, Rfl: 11   promethazine (PHENERGAN) 25 MG suppository, Place 1 suppository (25 mg total) rectally every 6 (six) hours as needed for nausea or vomiting., Disp: 6 each, Rfl: 1   rizatriptan (MAXALT-MLT) 10 MG disintegrating tablet, Take 1 tablet (10 mg  total) by mouth as needed for migraine. May repeat in 2 hours if needed, Disp: 10 tablet, Rfl: 5   Allergies  Allergen Reactions   Zoloft [Sertraline Hcl]     Unpleasant thoughts     Social History   Tobacco Use   Smoking status: Every Day    Packs/day: 0.25    Years: 30.00    Pack years: 7.50    Types: Cigarettes   Smokeless tobacco: Never   Tobacco comments:    Heavy smoker for most of her years of smoking  Vaping Use   Vaping Use: Never used  Substance Use Topics   Alcohol use: No   Drug use: No      Chart Review Today: I personally reviewed active problem list, medication list, allergies, family history, social history, health maintenance, notes from last encounter, lab results, imaging with the patient/caregiver today.   Review of Systems  Constitutional: Negative.   HENT: Negative.    Eyes: Negative.   Respiratory: Negative.    Cardiovascular: Negative.   Gastrointestinal: Negative.   Endocrine: Negative.   Genitourinary: Negative.   Musculoskeletal: Negative.   Skin: Negative.   Allergic/Immunologic: Negative.   Neurological: Negative.   Hematological: Negative.   Psychiatric/Behavioral: Negative.    All other systems reviewed and are negative.     Objective:   Vitals:   10/20/20 1324  BP: 130/84  Pulse: 95  Resp: 16  Temp: 98.7 F (37.1 C)  SpO2: 98%  Weight: 252 lb 1.6 oz (114.4 kg)  Height: '5\' 6"'$  (1.676 m)    Body mass index is 40.69 kg/m.  Physical Exam Vitals and nursing note reviewed.  Constitutional:      General: She is not in acute distress.    Appearance: Normal appearance. She is well-developed. She is obese. She is not ill-appearing, toxic-appearing or diaphoretic.     Interventions: Face mask in place.  HENT:     Head: Normocephalic and atraumatic.     Right Ear: External ear normal.     Left Ear: External ear normal.  Eyes:     General: Lids are normal. No scleral icterus.       Right eye: No discharge.        Left  eye: No discharge.     Conjunctiva/sclera: Conjunctivae normal.  Neck:     Trachea: Phonation normal. No tracheal deviation.  Cardiovascular:     Rate and Rhythm: Normal rate and regular rhythm.     Pulses: Normal pulses.          Radial pulses are 2+ on the right side and 2+ on the left side.       Posterior tibial  pulses are 2+ on the right side and 2+ on the left side.     Heart sounds: Normal heart sounds. No murmur heard.   No friction rub. No gallop.  Pulmonary:     Effort: Pulmonary effort is normal. No respiratory distress.     Breath sounds: Normal breath sounds. No stridor. No wheezing, rhonchi or rales.  Chest:     Chest wall: No tenderness.  Abdominal:     General: Bowel sounds are normal. There is no distension.     Palpations: Abdomen is soft.     Tenderness: There is no abdominal tenderness. There is no right CVA tenderness, left CVA tenderness, guarding or rebound.  Musculoskeletal:     Right lower leg: No edema.     Left lower leg: No edema.  Skin:    General: Skin is warm and dry.     Coloration: Skin is not jaundiced or pale.     Findings: No rash.  Neurological:     Mental Status: She is alert.     Motor: No abnormal muscle tone.     Gait: Gait normal.  Psychiatric:        Mood and Affect: Mood normal.        Speech: Speech normal.        Behavior: Behavior normal.     Results for orders placed or performed in visit on 10/08/20  Urine Culture   Specimen: Urine  Result Value Ref Range   MICRO NUMBER: [id number]    SPECIMEN QUALITY: Adequate    Sample Source URINE, CLEAN CATCH    STATUS: FINAL    ISOLATE 1: Enterobacter aerogenes (A)       Susceptibility   Enterobacter aerogenes - URINE CULTURE, REFLEX    AMOX/CLAVULANIC 16 Resistant     CEFAZOLIN* >=64 Resistant      * For uncomplicated UTI caused by E. coli, K. pneumoniae or P. mirabilis: Cefazolin is susceptible if MIC <32 mcg/mL and predicts susceptible to the oral agents cefaclor,  cefdinir, cefpodoxime, cefprozil, cefuroxime, cephalexin and loracarbef.     CEFTAZIDIME <=1 Sensitive     CEFEPIME <=1 Sensitive     CEFTRIAXONE <=1 Sensitive     CIPROFLOXACIN <=0.25 Sensitive     LEVOFLOXACIN <=0.12 Sensitive     GENTAMICIN <=1 Sensitive     IMIPENEM 1 Sensitive     NITROFURANTOIN 128 Resistant     PIP/TAZO <=4 Sensitive     TOBRAMYCIN <=1 Sensitive     TRIMETH/SULFA* <=20 Sensitive      * For uncomplicated UTI caused by E. coli, K. pneumoniae or P. mirabilis: Cefazolin is susceptible if MIC <32 mcg/mL and predicts susceptible to the oral agents cefaclor, cefdinir, cefpodoxime, cefprozil, cefuroxime, cephalexin and loracarbef. Legend: S = Susceptible  I = Intermediate R = Resistant  NS = Not susceptible * = Not tested  NR = Not reported **NN = See antimicrobic comments   POCT urinalysis dipstick  Result Value Ref Range   Color, UA yellow    Clarity, UA clear    Glucose, UA Negative Negative   Bilirubin, UA negative    Ketones, UA negative    Spec Grav, UA 1.020 1.010 - 1.025   Blood, UA large    pH, UA 5.0 5.0 - 8.0   Protein, UA Positive (A) Negative   Urobilinogen, UA 0.2 0.2 or 1.0 E.U./dL   Nitrite, UA negative    Leukocytes, UA Trace (A) Negative   Appearance clear  Odor none   Cervicovaginal ancillary only  Result Value Ref Range   Candida Vaginitis Negative    Candida Glabrata Negative    Bacterial Vaginitis (gardnerella) Positive (A)    Comment Normal Reference Range Candida Species - Negative    Comment Normal Reference Range Candida Galbrata - Negative    Comment      Normal Reference Range Bacterial Vaginosis - Negative       Assessment & Plan:     ICD-10-CM   1. Urine frequency  R35.0 POCT Urinalysis Dipstick    Urine Culture    COMPLETE METABOLIC PANEL WITH GFR    Hemoglobin A1c   recurrent - retest urine culture, vaginal swab, r/o T2DM, may be atrophic vaginitis sx?    2. Low back pain, unspecified back pain  laterality, unspecified chronicity, unspecified whether sciatica present  M54.50 POCT Urinalysis Dipstick    Urine Culture    3. Foul smelling urine  R82.90 POCT Urinalysis Dipstick    Urine Culture    Cervicovaginal ancillary only    4. Excessive thirst  [id number] COMPLETE METABOLIC PANEL WITH GFR    Hemoglobin A1c    5. Hyperglycemia  R73.9 Hemoglobin A1c    6. Bacterial vaginosis  N76.0 Cervicovaginal ancillary only   B96.89    Hx of, retest    7. Anemia, unspecified type  D64.9 CBC with Differential/Platelet   f/up on last labs showing anemia, asx, VSS          Delsa Grana, PA-C 10/20/20 1:40 PM

## 2020-10-21 LAB — CBC WITH DIFFERENTIAL/PLATELET
Absolute Monocytes: 642 cells/uL (ref 200–950)
Basophils Absolute: 97 cells/uL (ref 0–200)
Basophils Relative: 1.1 %
Eosinophils Absolute: 308 cells/uL (ref 15–500)
Eosinophils Relative: 3.5 %
HCT: 41.1 % (ref 35.0–45.0)
Hemoglobin: 13.8 g/dL (ref 11.7–15.5)
Lymphs Abs: 2886 cells/uL (ref 850–3900)
MCH: 30.1 pg (ref 27.0–33.0)
MCHC: 33.6 g/dL (ref 32.0–36.0)
MCV: 89.5 fL (ref 80.0–100.0)
MPV: 12 fL (ref 7.5–12.5)
Monocytes Relative: 7.3 %
Neutro Abs: 4866 cells/uL (ref 1500–7800)
Neutrophils Relative %: 55.3 %
Platelets: 305 10*3/uL (ref 140–400)
RBC: 4.59 10*6/uL (ref 3.80–5.10)
RDW: 12.3 % (ref 11.0–15.0)
Total Lymphocyte: 32.8 %
WBC: 8.8 10*3/uL (ref 3.8–10.8)

## 2020-10-21 LAB — URINE CULTURE
MICRO NUMBER:: 12286627
SPECIMEN QUALITY:: ADEQUATE

## 2020-10-21 LAB — COMPLETE METABOLIC PANEL WITH GFR
AG Ratio: 1.5 (calc) (ref 1.0–2.5)
ALT: 14 U/L (ref 6–29)
AST: 15 U/L (ref 10–35)
Albumin: 4.1 g/dL (ref 3.6–5.1)
Alkaline phosphatase (APISO): 75 U/L (ref 37–153)
BUN: 14 mg/dL (ref 7–25)
CO2: 26 mmol/L (ref 20–32)
Calcium: 9.3 mg/dL (ref 8.6–10.4)
Chloride: 105 mmol/L (ref 98–110)
Creat: 0.72 mg/dL (ref 0.50–1.03)
Globulin: 2.7 g/dL (calc) (ref 1.9–3.7)
Glucose, Bld: 87 mg/dL (ref 65–99)
Potassium: 4.6 mmol/L (ref 3.5–5.3)
Sodium: 140 mmol/L (ref 135–146)
Total Bilirubin: 0.3 mg/dL (ref 0.2–1.2)
Total Protein: 6.8 g/dL (ref 6.1–8.1)
eGFR: 101 mL/min/{1.73_m2} (ref >=60)

## 2020-10-21 LAB — HEMOGLOBIN A1C
Hgb A1c MFr Bld: 5.2 % of total Hgb (ref <5.7)
Mean Plasma Glucose: 103 mg/dL
eAG (mmol/L): 5.7 mmol/L

## 2020-10-22 ENCOUNTER — Ambulatory Visit
Admission: RE | Admit: 2020-10-22 | Discharge: 2020-10-22 | Disposition: A | Discharge status: Home / Self Care | Payer: 59 | Source: Ambulatory Visit | Attending: Student | Admitting: Student

## 2020-10-22 ENCOUNTER — Other Ambulatory Visit: Payer: Self-pay

## 2020-10-22 DIAGNOSIS — G43719 Chronic migraine without aura, intractable, without status migrainosus: Secondary | ICD-10-CM | POA: Diagnosis present

## 2020-10-22 LAB — CERVICOVAGINAL ANCILLARY ONLY
Bacterial Vaginitis (gardnerella): NEGATIVE
Candida Glabrata: NEGATIVE
Candida Vaginitis: NEGATIVE
Comment: NEGATIVE
Comment: NEGATIVE
Comment: NEGATIVE

## 2020-10-22 IMAGING — MR MR HEAD W/O CM
11 series · 48 of 48 positions shown · non-contrast
Comparison: None.

CLINICAL DATA: Migraine headaches

EXAM:
MRI HEAD WITHOUT CONTRAST
TECHNIQUE: Multiplanar, multiecho pulse sequences of the brain and surrounding
structures were obtained without intravenous contrast.

[Series 5: ax dwi_tracew · axial · 3.0mm · 0.65mm/px · z∈[-79,+76]mm · 5 of 48 slices shown]
[im 1/48]
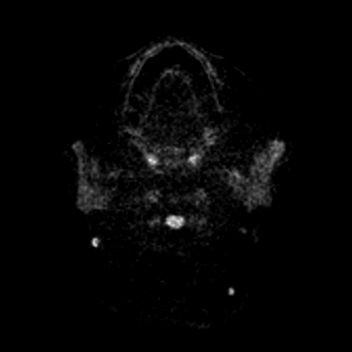
[im 12/48]
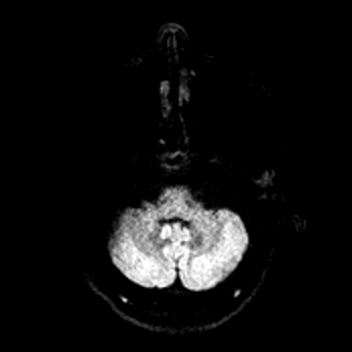
[im 24/48]
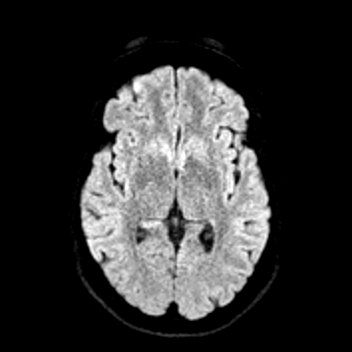
[im 36/48]
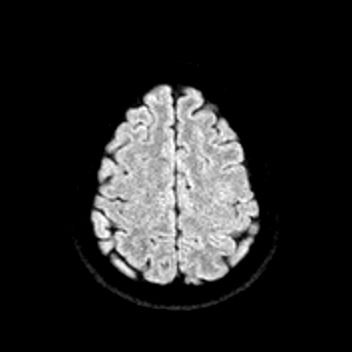
[im 48/48]
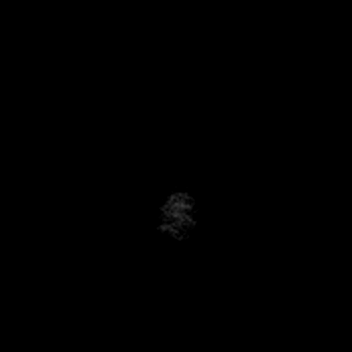

[Series 6: ax dwi_adc · axial · 3.0mm · 0.65mm/px · z∈[-79,+76]mm · 5 of 48 slices shown]
[im 1/48]
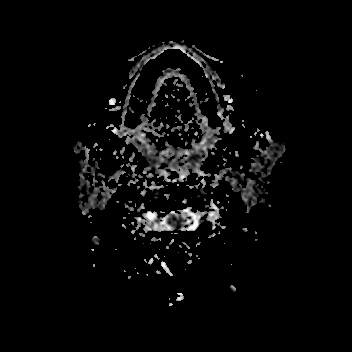
[im 12/48]
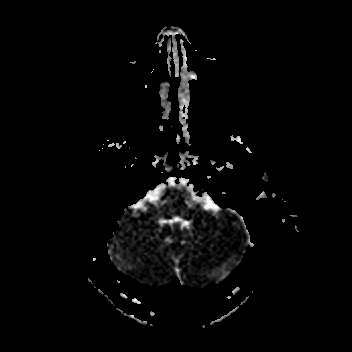
[im 24/48]
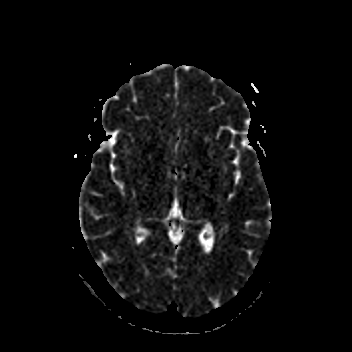
[im 36/48]
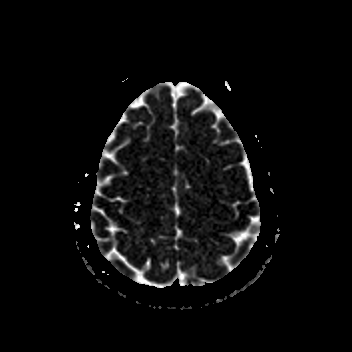
[im 48/48]
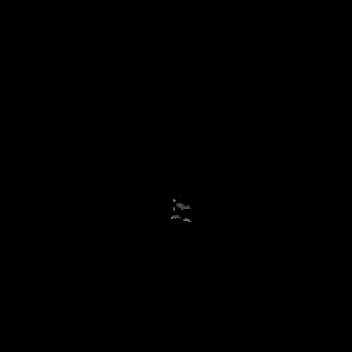

[Series 7: cor dwi_tracew · coronal · 5.0mm · 0.68mm/px · 3 of 38 slices shown]
[im 1/38]
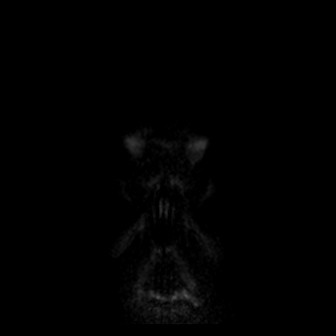
[im 19/38]
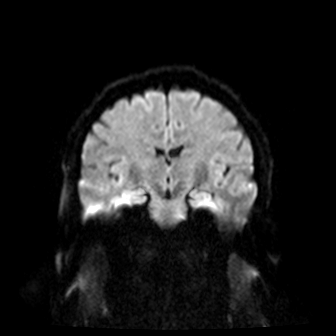
[im 38/38]
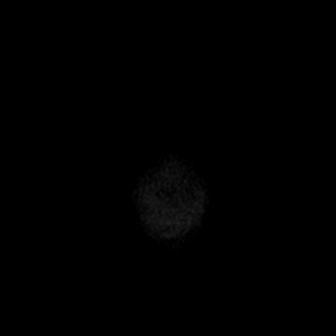

[Series 8: cor dwi_adc · coronal · 5.0mm · 0.68mm/px · 3 of 38 slices shown]
[im 1/38]
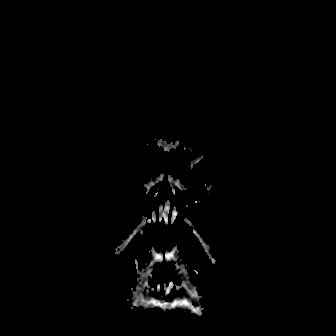
[im 19/38]
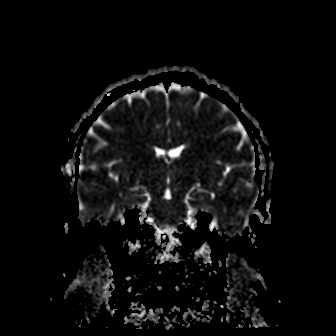
[im 38/38]
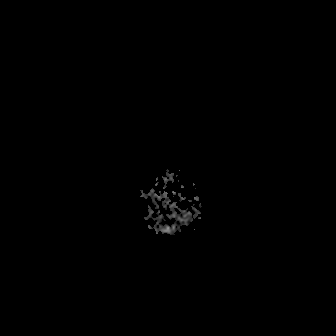

[Series 9: T1 · sagittal · 5.0mm · 0.62mm/px · 2 of 21 slices shown (1 of 2)]
[im 1/21]
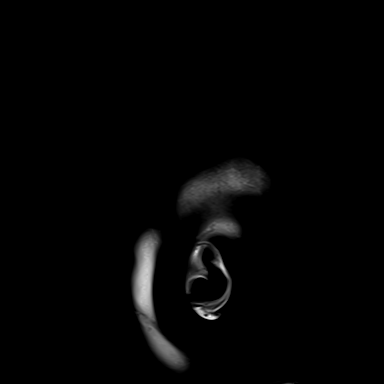
[im 21/21]
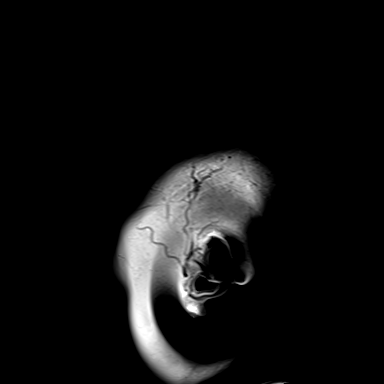

[Series 10: T2 · axial · 5.0mm · 0.53mm/px · z∈[-74,+69]mm · 2 of 25 slices shown (1 of 2)]
[im 1/25]
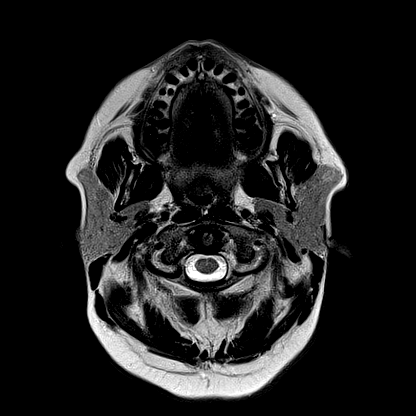
[im 25/25]
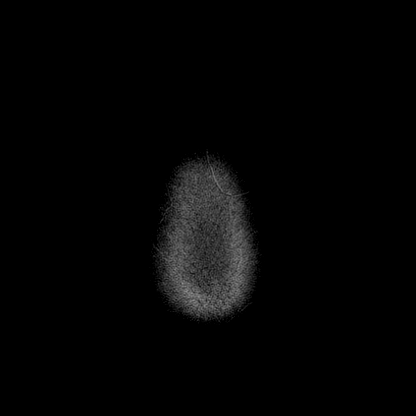

[Series 12: pha_images · axial · 3.0mm · 0.90mm/px · z∈[-79,+74]mm · 4 of 52 slices shown]
[im 1/52]
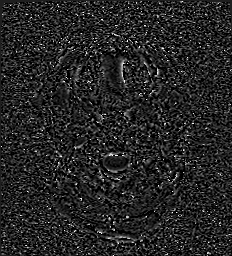
[im 18/52]
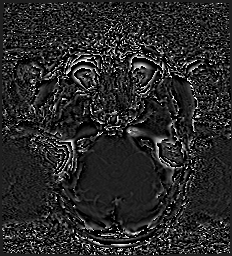
[im 35/52]
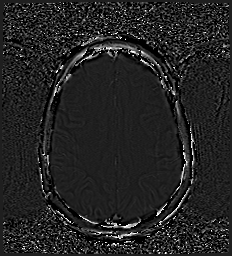
[im 52/52]
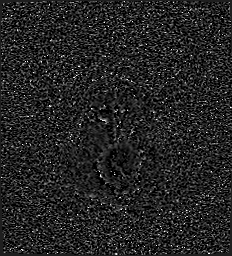

[Series 13: swi_images · axial · 3.0mm · 0.90mm/px · z∈[-79,+74]mm · 4 of 52 slices shown]
[im 1/52]
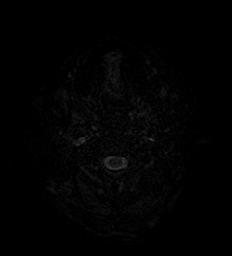
[im 18/52]
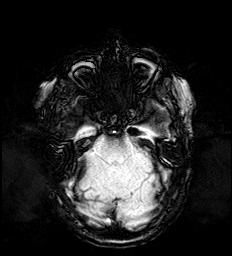
[im 35/52]
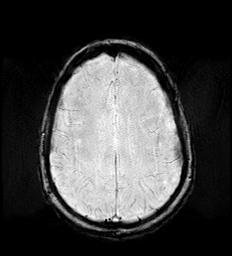
[im 52/52]
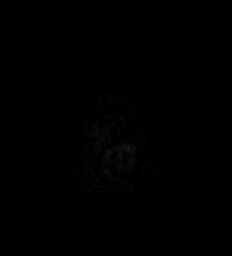

[Series 15: FLAIR · axial · 3.0mm · 0.53mm/px · z∈[-76,+71]mm · 4 of 50 slices shown]
[im 1/50]
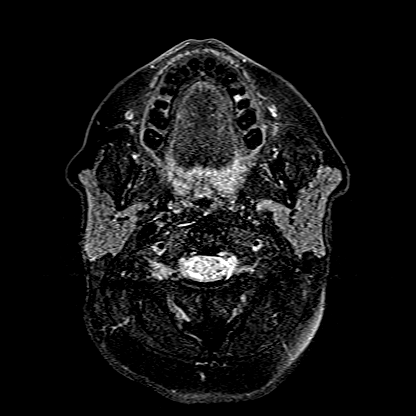
[im 17/50]
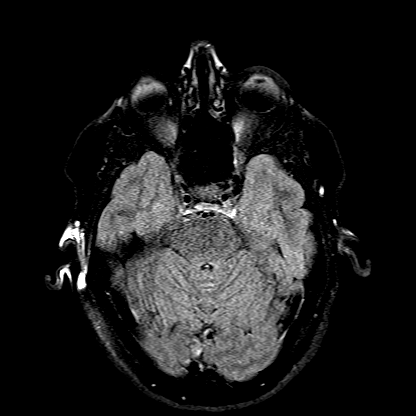
[im 33/50]
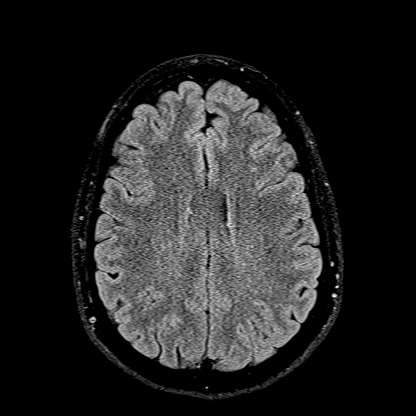
[im 50/50]
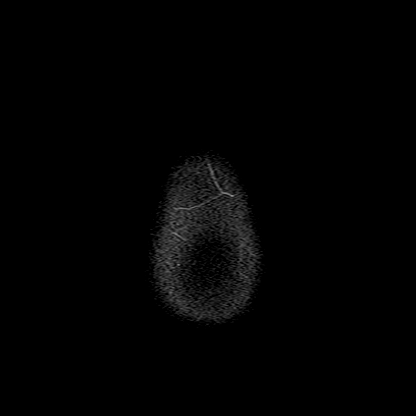

[Series 16: T1 · axial · 1.0mm · 0.98mm/px · z∈[-81,+78]mm · 14 of 160 slices shown (2 of 2)]
[im 1/160]
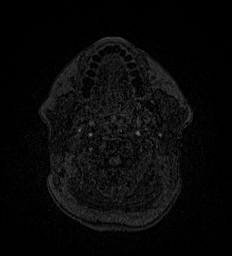
[im 13/160]
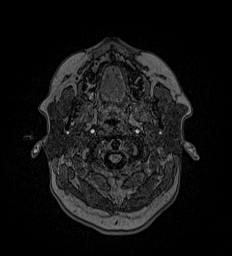
[im 25/160]
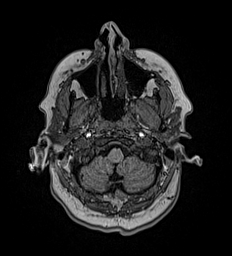
[im 37/160]
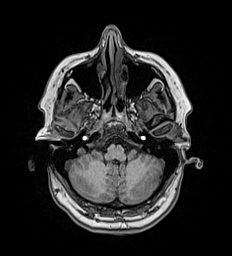
[im 49/160]
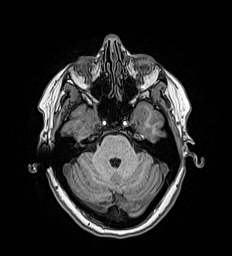
[im 62/160]
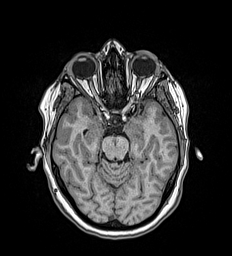
[im 74/160]
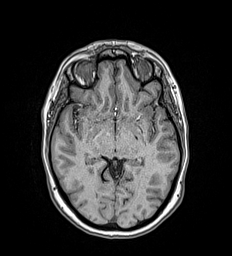
[im 86/160]
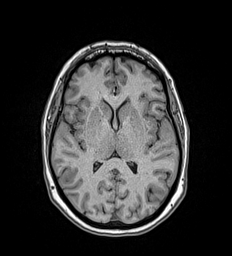
[im 98/160]
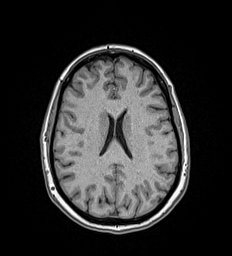
[im 111/160]
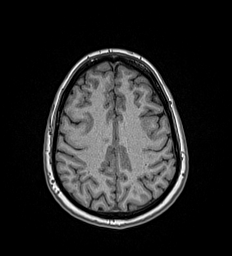
[im 123/160]
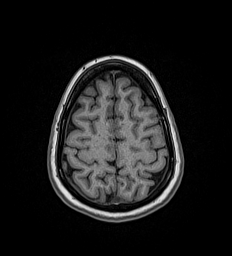
[im 135/160]
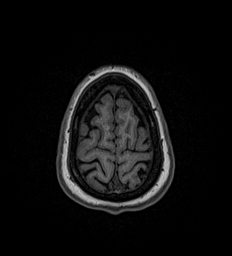
[im 147/160]
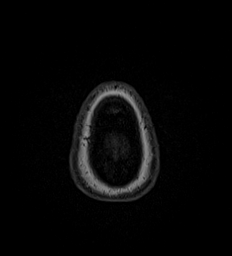
[im 160/160]
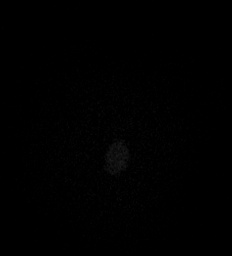

[Series 17: T2 · coronal · 5.0mm · 0.57mm/px · 2 of 29 slices shown (2 of 2)]
[im 1/29]
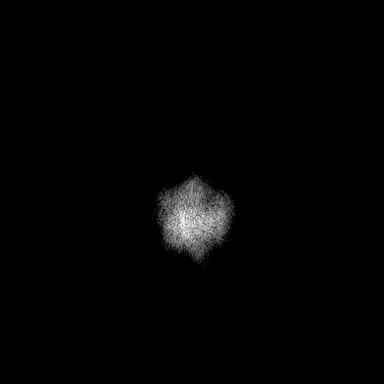
[im 29/29]
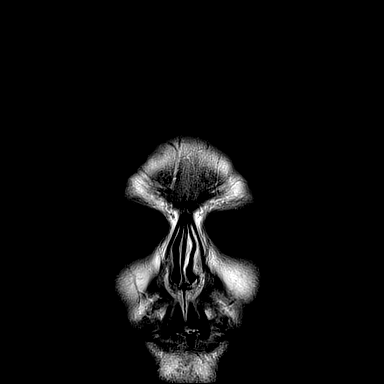

[48 of 48 positions shown; findings below may reference images not displayed]

FINDINGS: Brain: No acute infarct, mass effect or extra-axial collection. No
acute or chronic hemorrhage. There are a few bifrontal foci of
hyperintense T2-weighted signal in the white matter, as may be seen
in patients with migraines, but is also commonly present in
asymptomatic patients. The midline structures are normal.

Vascular: Major flow voids are preserved.

Skull and upper cervical spine: Normal calvarium and skull base.
Visualized upper cervical spine and soft tissues are normal.

Sinuses/Orbits:No paranasal sinus fluid levels or advanced mucosal
thickening. No mastoid or middle ear effusion. Normal orbits.
IMPRESSION: Normal MRI of the brain.

## 2020-11-08 ENCOUNTER — Encounter: Payer: Self-pay | Admitting: Family Medicine

## 2020-11-11 ENCOUNTER — Ambulatory Visit: Payer: 59 | Admitting: Sports Medicine

## 2020-11-11 ENCOUNTER — Ambulatory Visit
Admission: RE | Admit: 2020-11-11 | Discharge: 2020-11-11 | Disposition: A | Discharge status: Home / Self Care | Payer: 59 | Source: Ambulatory Visit | Attending: Sports Medicine | Admitting: Sports Medicine

## 2020-11-11 ENCOUNTER — Other Ambulatory Visit: Payer: Self-pay

## 2020-11-11 VITALS — Ht 66.0 in | Wt 260.0 lb

## 2020-11-11 DIAGNOSIS — G8929 Other chronic pain: Secondary | ICD-10-CM

## 2020-11-11 DIAGNOSIS — M5416 Radiculopathy, lumbar region: Secondary | ICD-10-CM | POA: Diagnosis not present

## 2020-11-11 IMAGING — CR DG LUMBAR SPINE 2-3V
2 series · 2 of 2 positions shown · non-contrast
Comparison: None.

CLINICAL DATA: Right-sided lower back pain.

EXAM:
LUMBAR SPINE - 2-3 VIEW

[w l-spine a.p. *]
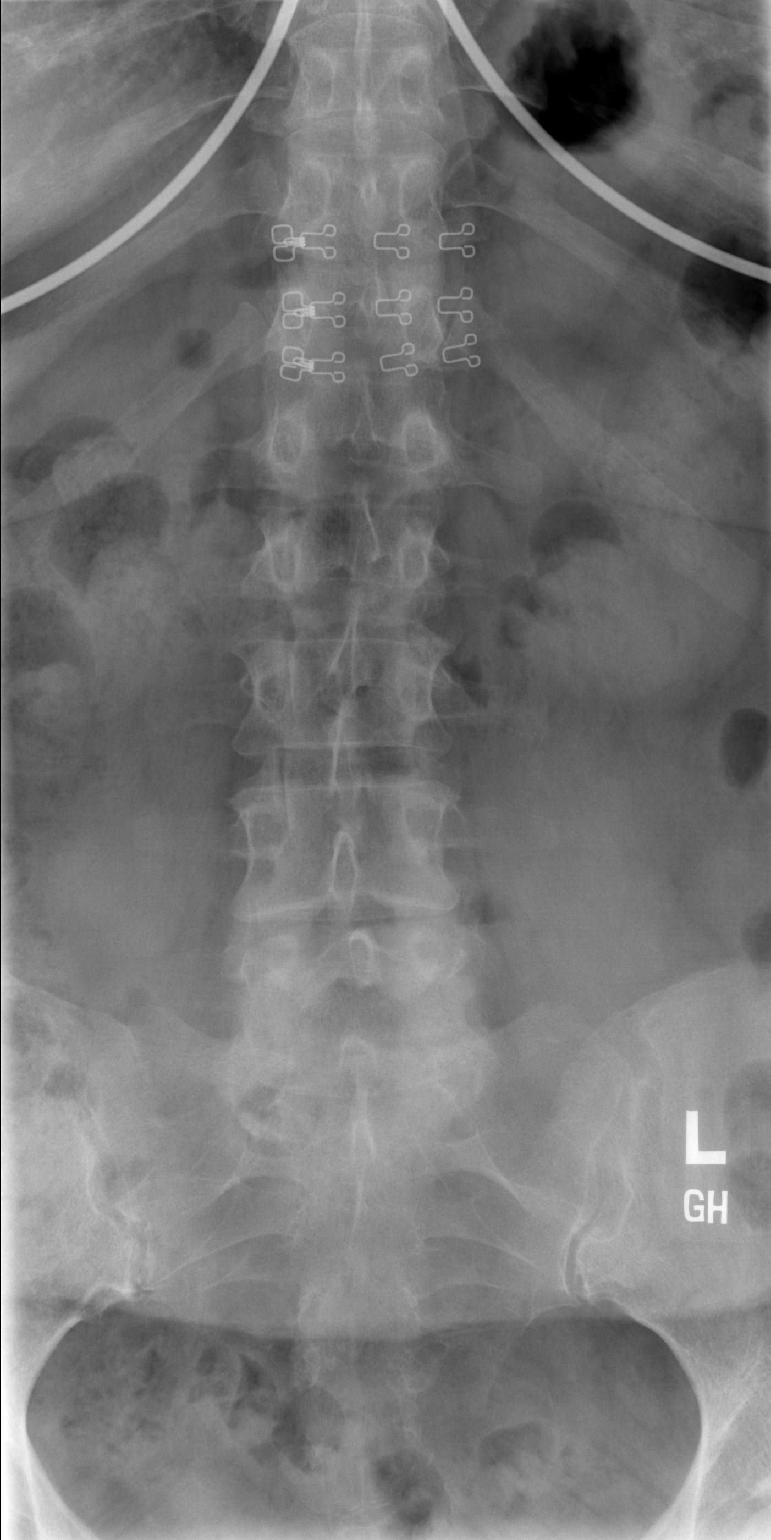

[w l-spine lat *]
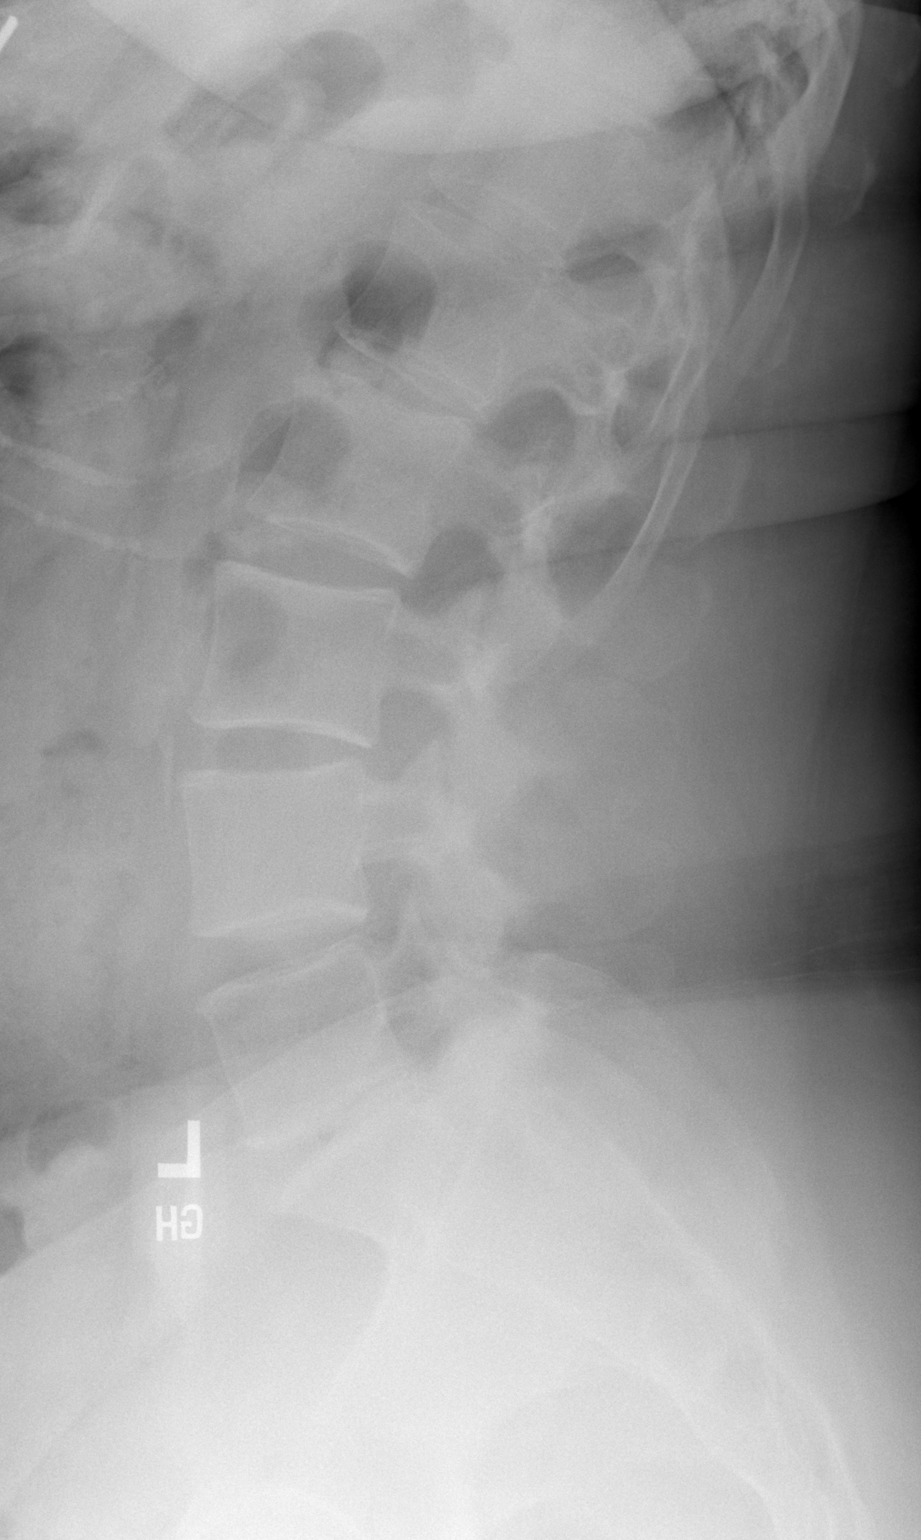

[2 of 2 positions shown; findings below may reference images not displayed]

FINDINGS: There is no evidence of lumbar spine fracture. Alignment is normal.
Mild to moderate severity endplate sclerosis is seen at the level of
L5-S1. Moderate severity intervertebral disc space narrowing is also
seen at the level of L5-S1.
IMPRESSION: Moderate severity degenerative disc disease at L5-S1.

## 2020-11-11 MED ORDER — MELOXICAM 15 MG PO TABS
ORAL_TABLET | ORAL | 0 refills | Status: DC
Start: 2020-11-11 — End: 2023-12-11

## 2020-11-12 NOTE — Progress Notes (Addendum)
   Subjective:    Patient ID: Christine [name], female    DOB: February 21, 1971, 50 y.o.   MRN: [id number]  HPI chief complaint plaint: Right leg and foot pain  50 year old female comes in today complaining of 2 to 3 months of right foot pain.  Pain is on the dorsum of her foot and will radiate up her leg to the lateral hip.  She describes it as a burning type pain.  It is worse with standing and walking.  Her foot will "lock up" pretty frequently which is quite painful.  She denies any pain in the ankle.  She has a history of [place] fasciitis but her current symptoms are different than what she experienced with that.  She denies any numbness but does endorse occasional weakness in the leg.  No known trauma.  She is not tried any medication for it.  Past medical history reviewed Medications reviewed Allergies reviewed    Review of Systems As above    Objective:   Physical Exam  Well-developed, well-nourished.  No acute distress  Right foot there is some tenderness to palpation across the dorsum of the foot but no erythema and no soft tissue swelling.  Full ankle range of motion.  No effusion.  Lumbar spine: No tenderness to palpation.  Full lumbar range of motion but she does have some reproducible pain with extension.  Neurological exam shows some slight weakness with resisted great toe extension on the right compared to the left.  Remainder of her strength is 5/5.  Sensation is intact grossly to light touch.  Reflexes are equal at the patellar and Achilles tendons.      Assessment & Plan:   Left foot and leg pain possibly secondary to L5 radiculopathy  Although her presentation is atypical, her radiating pain does follow the L5 dermatome.  She also has some appreciable weakness with resisted great toe extension on the right compared to the left.  We will start with meloxicam 15 mg daily for 7 days then as needed.  If pain persists after that then we will consider a short course of oral  prednisone.  I would like to get x-rays of her lumbar spine and she will start physical therapy in [place].  I have instructed the physical therapist to avoid extension exercises and instead focus on Williams flexion exercises.  Christine [name] will follow up with me again in 4 weeks for reevaluation.  Call with questions or concerns in the interim.  This note was dictated using Dragon naturally speaking software and may contain errors in syntax, spelling, or content which have not been identified prior to signing this note.    11/17/2020 addendum: X-rays reviewed.  There is moderately advanced degenerative disc disease at L5-S1.  Treatment as above and consider MRI if symptoms do not improve.

## 2020-11-17 ENCOUNTER — Telehealth: Payer: Self-pay | Admitting: Sports Medicine

## 2020-11-17 ENCOUNTER — Other Ambulatory Visit: Payer: Self-pay

## 2020-11-17 MED ORDER — PREDNISONE 10 MG PO TABS: ORAL_TABLET | ORAL | 0 refills | Status: DC

## 2020-11-17 NOTE — Telephone Encounter (Signed)
  I spoke with Christine [name] on the phone today after reviewing x-rays of her lumbar spine.  She does have some degenerative changes at L5-S1.  Meloxicam has not been beneficial so I recommend a 6-day Sterapred Dosepak.  She has yet to start physical therapy.  If she starts PT and completes her Dosepak but still has pain, I would like for her to follow-up with me sooner than her 4-week follow-up.

## 2020-12-09 ENCOUNTER — Ambulatory Visit: Payer: 59 | Admitting: Sports Medicine

## 2020-12-09 ENCOUNTER — Ambulatory Visit
Admission: RE | Admit: 2020-12-09 | Discharge: 2020-12-09 | Disposition: A | Discharge status: Home / Self Care | Payer: 59 | Source: Ambulatory Visit | Attending: Sports Medicine | Admitting: Sports Medicine

## 2020-12-09 VITALS — BP 144/68 | Ht 67.0 in | Wt 257.0 lb

## 2020-12-09 DIAGNOSIS — M5416 Radiculopathy, lumbar region: Secondary | ICD-10-CM | POA: Diagnosis not present

## 2020-12-09 DIAGNOSIS — M79671 Pain in right foot: Secondary | ICD-10-CM

## 2020-12-09 IMAGING — CR DG FOOT COMPLETE 3+V*R*
3 series · 3 of 3 positions shown · non-contrast
Comparison: None.

CLINICAL DATA: Pt c/o pain and a hard knot at base of right 5th MT
x at least a month.

EXAM:
RIGHT FOOT COMPLETE - 3+ VIEW

[t foot ap right]
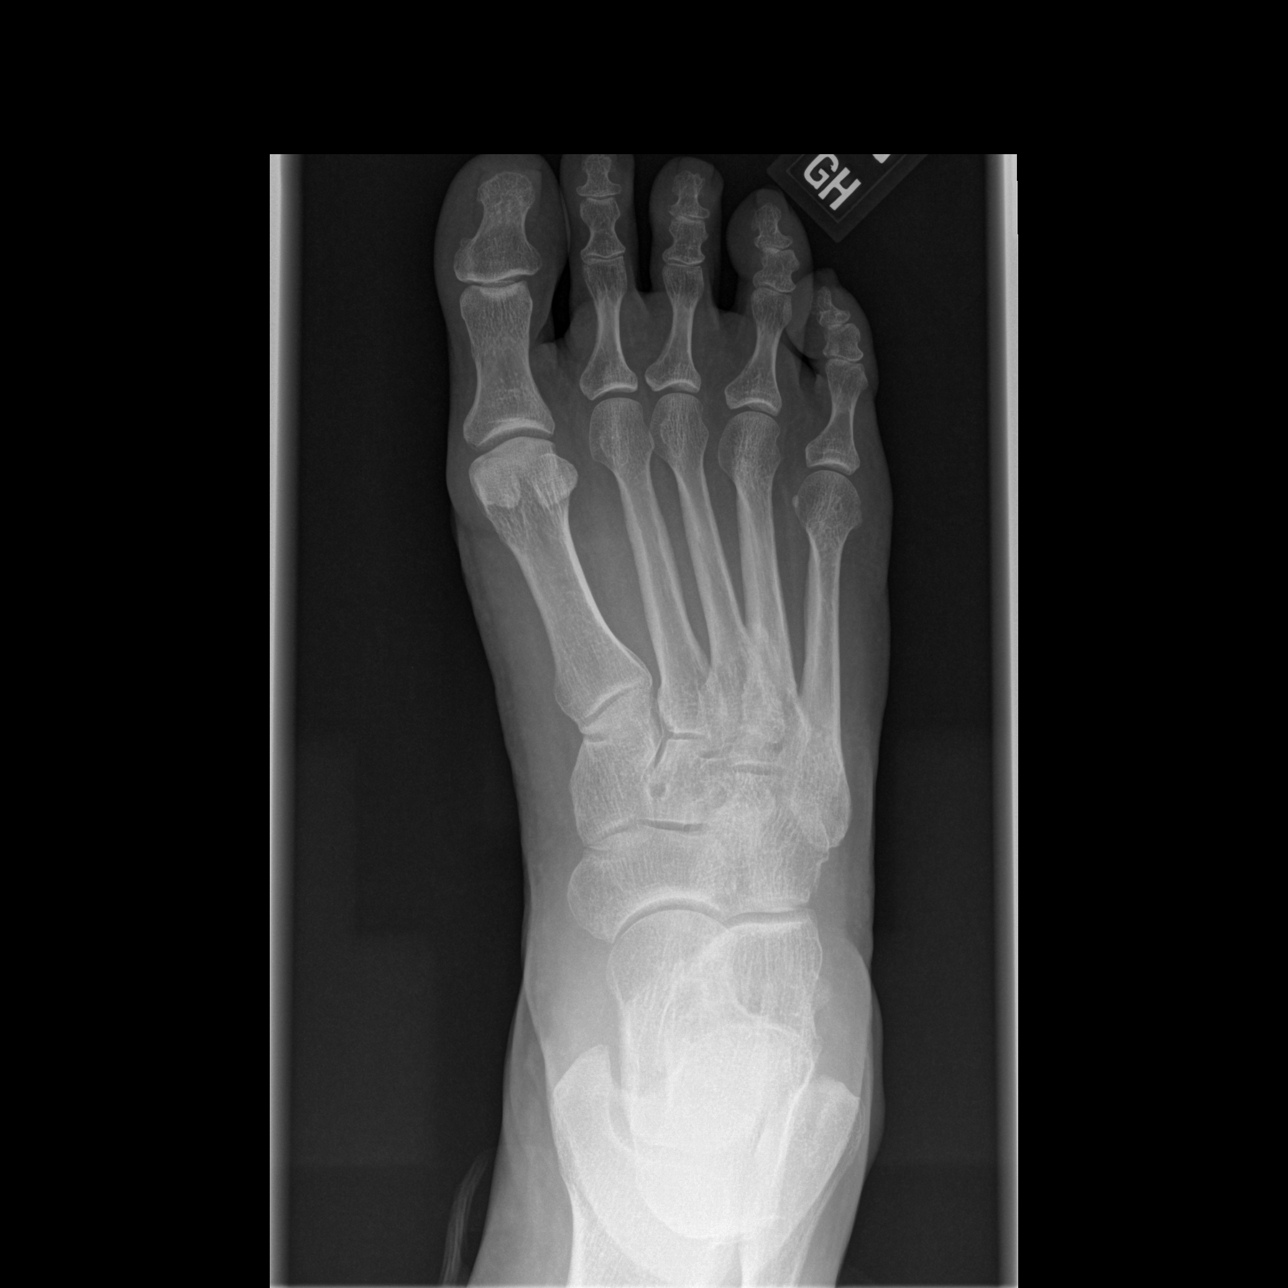

[t foot oblique right]
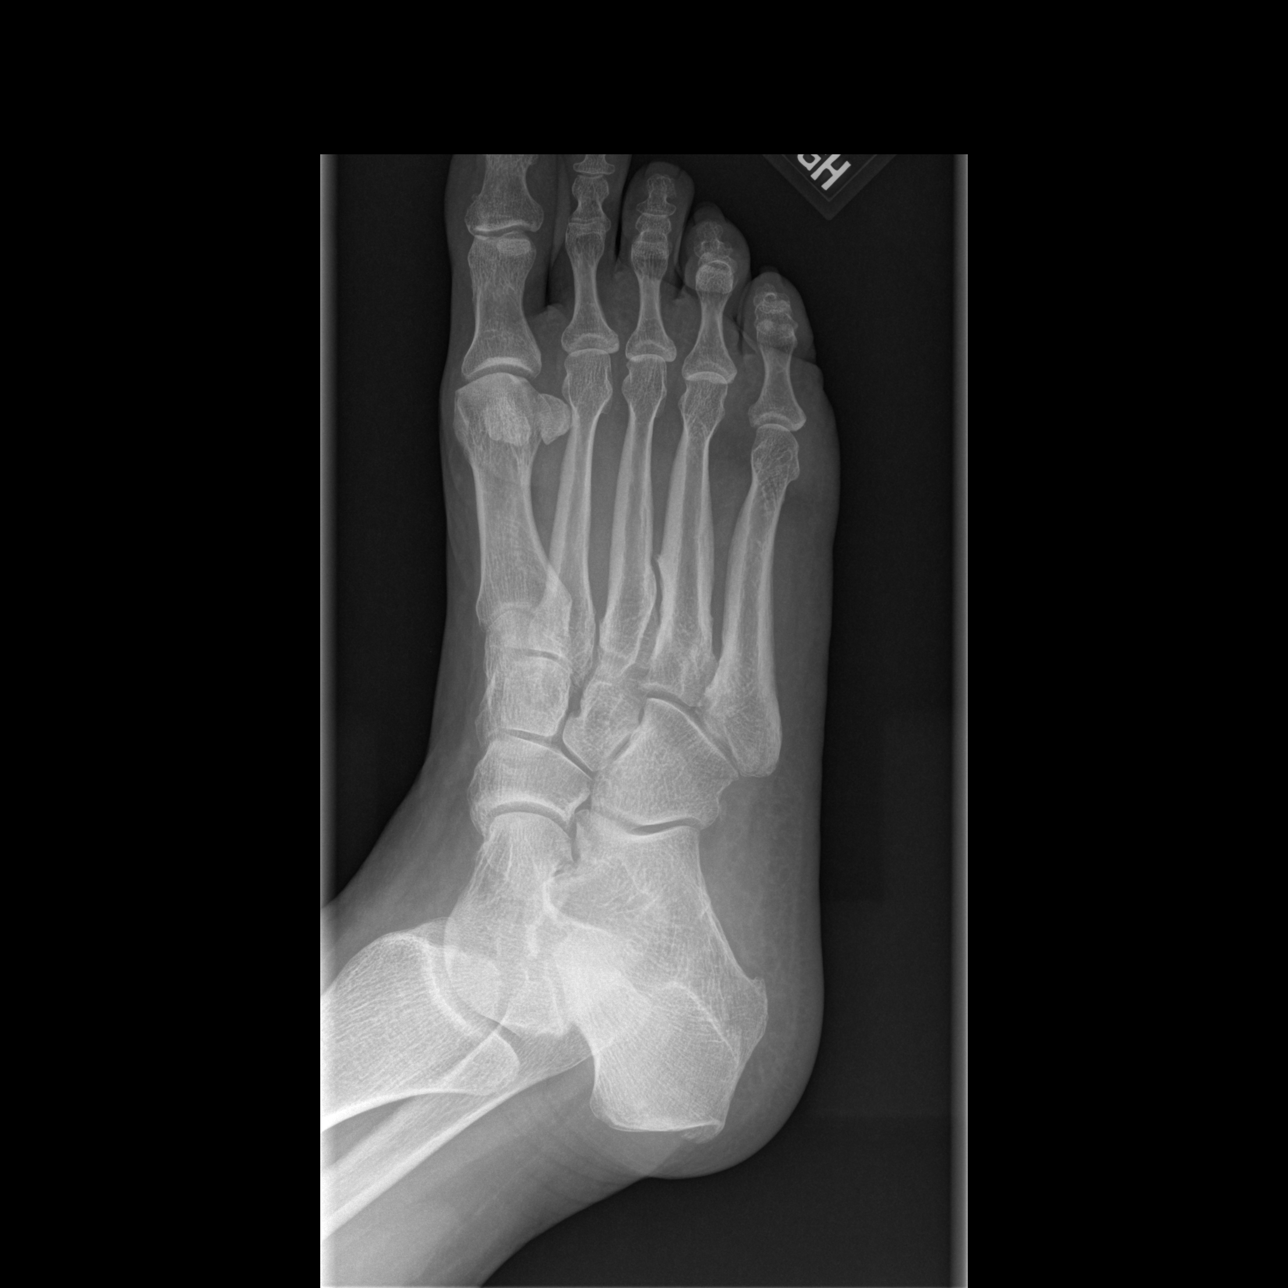

[t foot lat right]
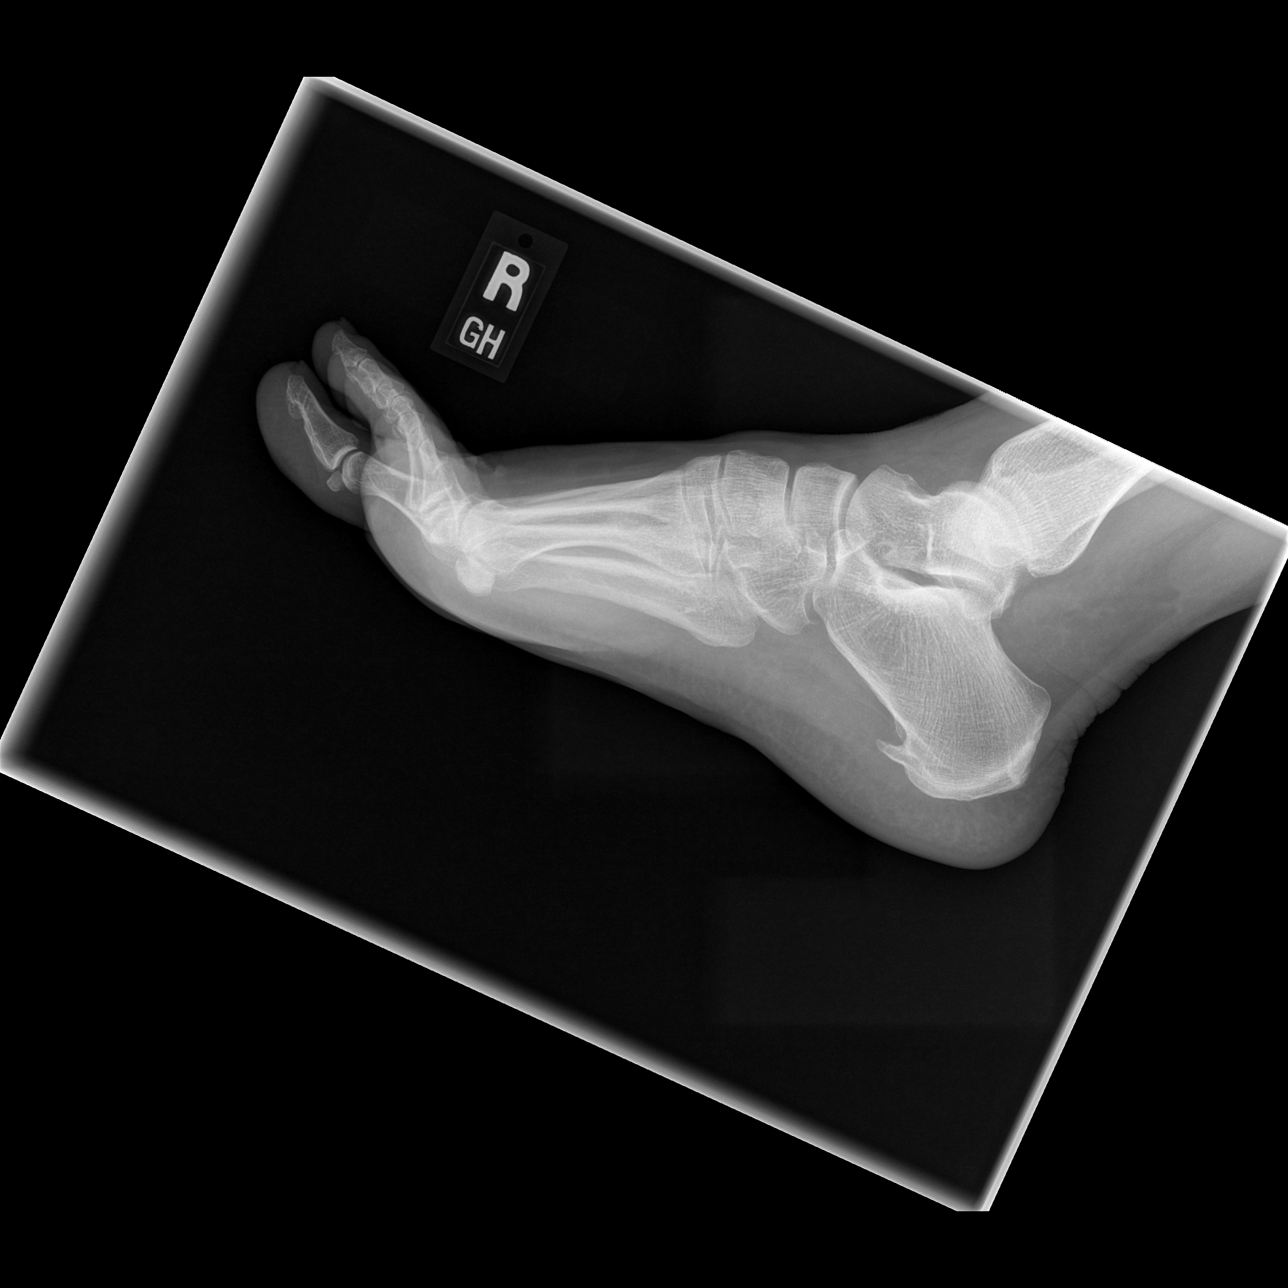

[3 of 3 positions shown; findings below may reference images not displayed]

FINDINGS: There is no evidence of fracture or dislocation. Mild degenerative
changes in the midfoot. Small calcaneal spur. Soft tissues are
unremarkable.
IMPRESSION: No radiographic finding to explain the patient's lateral foot pain.

## 2020-12-09 NOTE — Progress Notes (Addendum)
   Subjective:    Patient ID: Christine [name], female    DOB: Jul 28, 1970, 50 y.o.   MRN: [id number]  HPI  Christine [name] presents today for follow-up on right foot and ankle pain that is thought to be the result of lumbar radiculopathy.  She has not noticed significant improvement with either meloxicam or oral steroids.  She has not yet started physical therapy because they were out of her network.  She still describes a sensation of her foot "locking up" but this is also accompanied by radiating pain up the leg into the right side of the lumbar spine.  Recent x-rays of her lumbar spine does show moderate endplate sclerosis at O8-N8 as well as moderately severe degenerative disc disease at L5-S1.    Review of Systems As above    Objective:   Physical Exam  Well, well-nourished.  No acute distress  Neurological exam still shows 4/5 strength with resisted great toe extension on the right compared to 5/5 on the left.  Reflexes however are brisk and equal at the patellar and Achilles tendons.  Remainder of her strength is 5/5 and sensation is intact to light touch grossly.  Examination of the right foot shows an indurated area along the lateral foot which is tender to palpation.  It not appear to be fluctuant.      Assessment & Plan:   Persistent right foot pain thought to be secondary to lumbar radiculopathy Right foot pain   X-rays of the right foot.  Phone follow-up with those results when available.  For diagnostic as well as therapeutic reasons I would like to proceed with a lumbar ESI but I will need to get a preprocedural MRI of her lumbar spine first.  She is in agreement with that.  I also want her to talk with her insurance to find which physical therapist is in network for her as physical therapy will be an important part of her treatment.  Phone follow-up with her MRI findings when available and, again, I anticipate proceeding with diagnostic lumbar ESI.  This note was dictated using  Dragon naturally speaking software and may contain errors in syntax, spelling, or content which have not been identified prior to signing this note.    Addendum: X-ray of the right foot is unremarkable.  I will refer the patient to Dr. Daylene Katayama for further evaluation and treatment of her painful right foot.  I will call her with the MRI results of her lumbar spine when available.

## 2020-12-14 ENCOUNTER — Other Ambulatory Visit: Payer: Self-pay

## 2020-12-14 DIAGNOSIS — M79671 Pain in right foot: Secondary | ICD-10-CM

## 2020-12-23 NOTE — Addendum Note (Signed)
Addended by: Jolinda Croak E on: 12/23/2020 10:40 AM   Modules accepted: Orders

## 2020-12-26 ENCOUNTER — Other Ambulatory Visit: Payer: 59

## 2020-12-27 ENCOUNTER — Ambulatory Visit (INDEPENDENT_AMBULATORY_CARE_PROVIDER_SITE_OTHER): Payer: 59

## 2020-12-27 ENCOUNTER — Other Ambulatory Visit: Payer: Self-pay

## 2020-12-27 ENCOUNTER — Ambulatory Visit (INDEPENDENT_AMBULATORY_CARE_PROVIDER_SITE_OTHER): Payer: 59 | Admitting: Podiatry

## 2020-12-27 ENCOUNTER — Other Ambulatory Visit: Payer: Self-pay | Admitting: Podiatry

## 2020-12-27 DIAGNOSIS — M778 Other enthesopathies, not elsewhere classified: Secondary | ICD-10-CM

## 2020-12-27 DIAGNOSIS — M779 Enthesopathy, unspecified: Secondary | ICD-10-CM

## 2020-12-27 DIAGNOSIS — M79671 Pain in right foot: Secondary | ICD-10-CM

## 2020-12-27 NOTE — Progress Notes (Signed)
   HPI: 50 y.o. female presenting today as a new patient referral from Dr. Micheline Chapman, sports medicine for evaluation of pain and tenderness to the right lateral foot.  Patient states that she has noticed a lump develop there over the last few months.  She does have history of ganglion cyst however Dr. Micheline Chapman mentioned that he does not believe it is a ganglion cyst.  She presents for further treatment and evaluation  Past Medical History:  Diagnosis Date   Abnormal uterine bleeding (AUB)    Anxiety    Arthritis    Complication of anesthesia    Depression    GERD (gastroesophageal reflux disease)    History of kidney stones    Irregular uterine bleeding    Menorrhagia with irregular cycle 11/19/2013   Migraines    Multiple personality disorder (HCC)    PONV (postoperative nausea and vomiting)      Physical Exam: General: The patient is alert and oriented x3 in no acute distress.  Dermatology: Skin is warm, dry and supple bilateral lower extremities. Negative for open lesions or macerations.  Vascular: Palpable pedal pulses bilaterally. No edema or erythema noted. Capillary refill within normal limits.  Neurological: Epicritic and protective threshold grossly intact bilaterally.   Musculoskeletal Exam: Range of motion within normal limits to all pedal and ankle joints bilateral. Muscle strength 5/5 in all groups bilateral.  There is a hard palpable nodule at the base of the fourth TMT joint right foot with associated tenderness to palpation.  This lesion is not fluctuant.  Likely negative for ganglion cyst  Radiographic Exam:  Normal osseous mineralization. Joint spaces preserved. No fracture/dislocation/boney destruction.  There is a small subtle circular lesion at the base of the fourth metatarsal TMT joint best visualized on AP view overlying the fifth metatarsal.  This is the area of pain and I suspect this is an osteophyte formation  Assessment: 1.  Likely bone spur/osteophyte right  lateral fourth TMT joint   Plan of Care:  1. Patient evaluated. X-Rays reviewed.  2.  Today has discussed different treatment options with the patient.  We could order an MRI for better visualization however I believe the MRI would be somewhat insignificant and would not change the course of action. 3.  Recommend surgical excisional biopsy of the osteophyte/symptomatic mass.  She would need to take about 2 weeks off of work.  Patient states that currently she is unable to do so. 4.  Injection of 0.5 cc Celestone Soluspan injection the fourth TMT joint right foot to help reduce some inflammation and alleviate some pain 5.  Continue meloxicam 15 mg daily and the patient has at home 6.  Return to clinic as needed, when she is ready for surgical excision which we discussed today or for an additional injection if she needs to buy more time prior to surgery      Edrick Kins, DPM [facility]  Dr. Edrick Kins, DPM    [address], [place] [address]                Office [phone]  Fax [phone]

## 2021-01-02 ENCOUNTER — Ambulatory Visit
Admission: RE | Admit: 2021-01-02 | Discharge: 2021-01-02 | Disposition: A | Discharge status: Home / Self Care | Payer: 59 | Source: Ambulatory Visit | Attending: Sports Medicine | Admitting: Sports Medicine

## 2021-01-02 ENCOUNTER — Other Ambulatory Visit: Payer: Self-pay

## 2021-01-02 DIAGNOSIS — M5416 Radiculopathy, lumbar region: Secondary | ICD-10-CM | POA: Insufficient documentation

## 2021-01-02 IMAGING — MR MR LUMBAR SPINE W/O CM
5 series · 31 of 48 positions shown · non-contrast
Comparison: Lumbar spine radiograph [DATE]

CLINICAL DATA: Lumbar radiculopathy, no red flags rule out lumbar
disc herniation

EXAM:
MRI LUMBAR SPINE WITHOUT CONTRAST
TECHNIQUE: Multiplanar, multisequence MR imaging of the lumbar spine was
performed. No intravenous contrast was administered.

[Series 5: T2 · sagittal · 4.0mm · 0.81mm/px · 6 of 15 slices shown (1 of 2)]
[im 1/15]
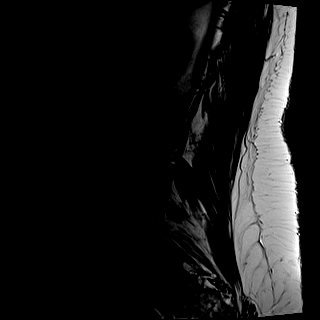
[im 3/15]
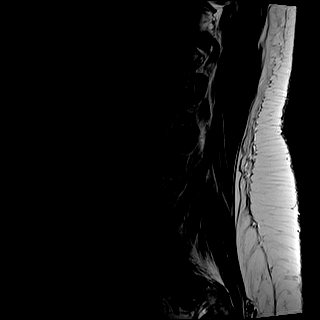
[im 6/15]
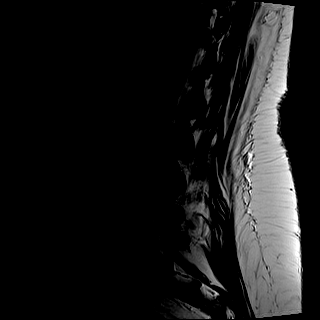
[im 9/15]
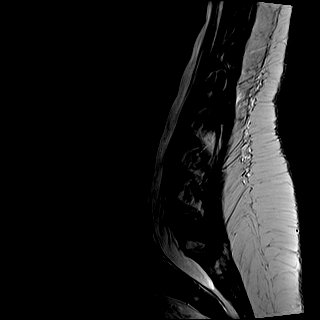
[im 12/15]
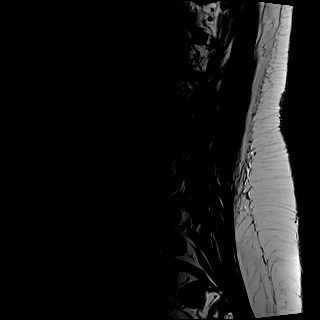
[im 15/15]
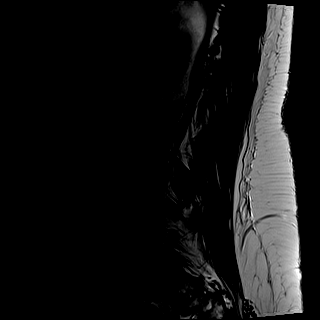

[Series 6: T1 · sagittal · 4.0mm · 0.81mm/px · 6 of 15 slices shown (1 of 2)]
[im 1/15]
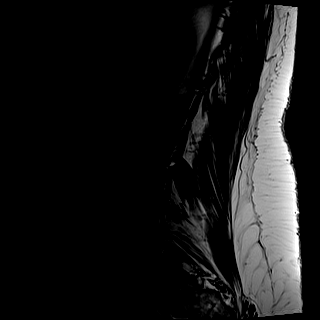
[im 3/15]
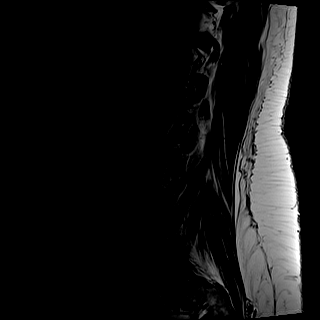
[im 6/15]
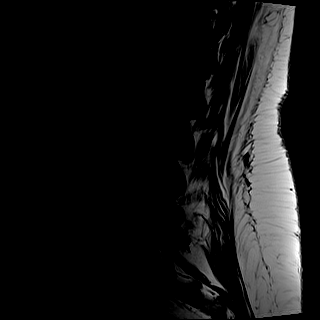
[im 9/15]
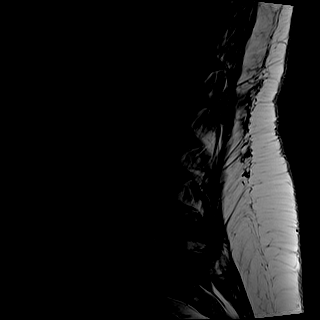
[im 12/15]
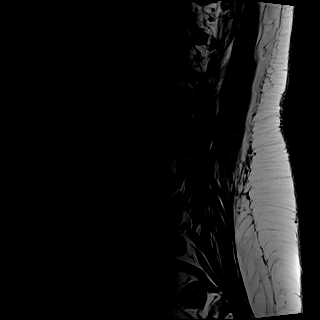
[im 15/15]
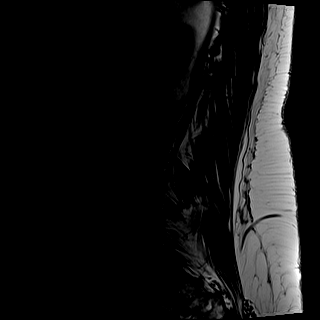

[Series 7: STIR · sagittal · 4.0mm · 0.41mm/px · 1 of 15 slices shown]
[im 1/15]
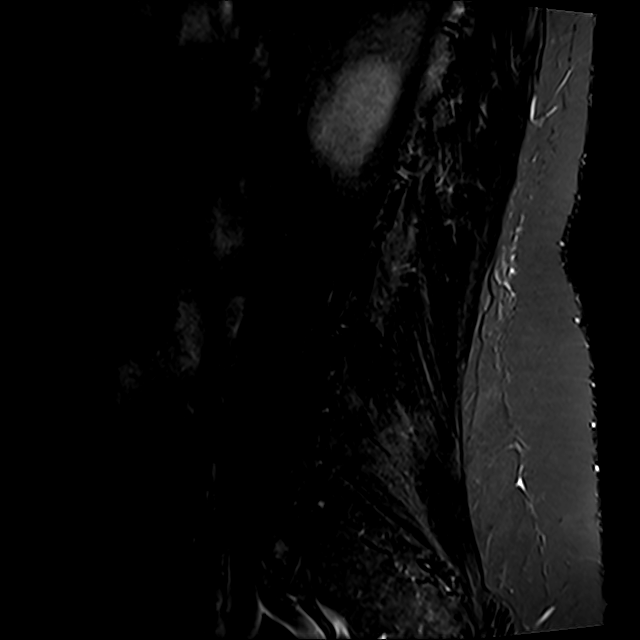

[Series 8: T2 · axial · 4.0mm · 0.78mm/px · z∈[-119,+112]mm · 9 of 37 slices shown (2 of 2)]
[im 1/37]
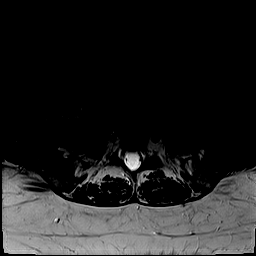
[im 6/37]
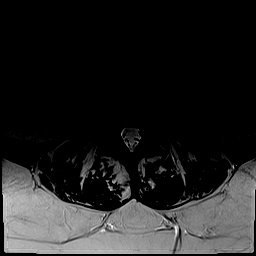
[im 11/37]
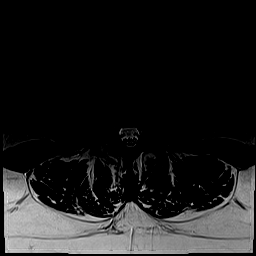
[im 16/37]
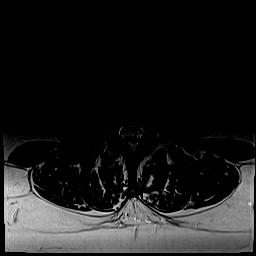
[im 19/37]
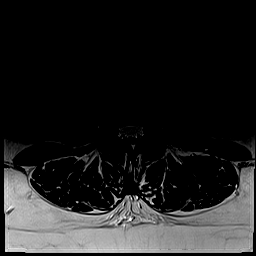
[im 21/37]
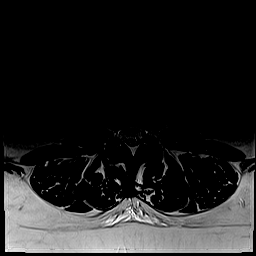
[im 26/37]
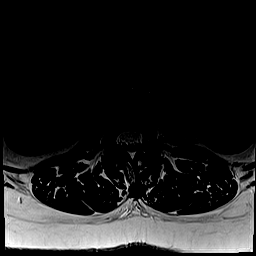
[im 31/37]
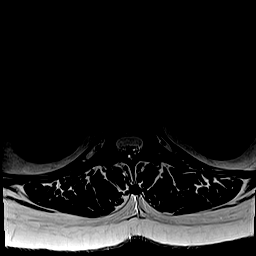
[im 37/37]
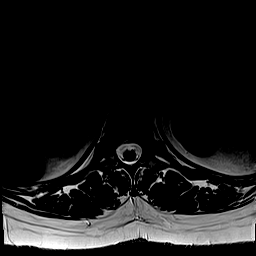

[Series 9: T1 · axial · 4.0mm · 0.39mm/px · z∈[-119,+112]mm · 9 of 37 slices shown (2 of 2)]
[im 1/37]
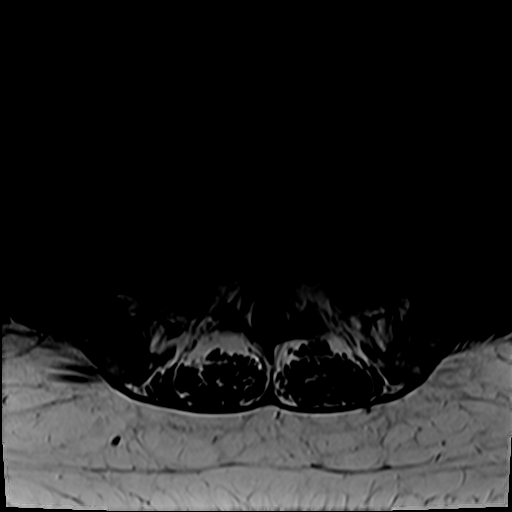
[im 6/37]
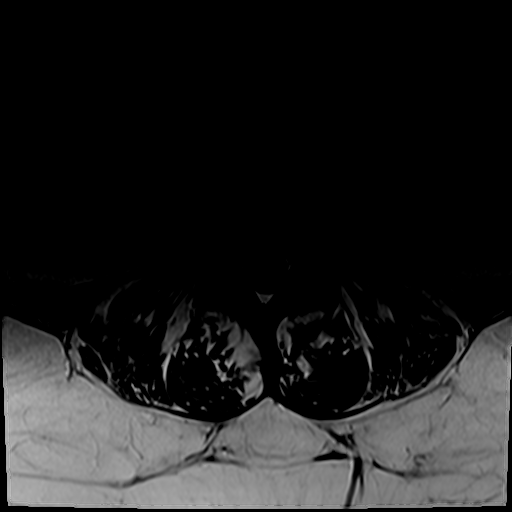
[im 11/37]
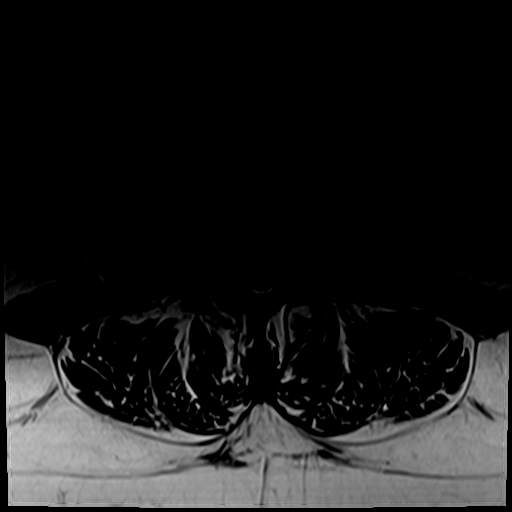
[im 16/37]
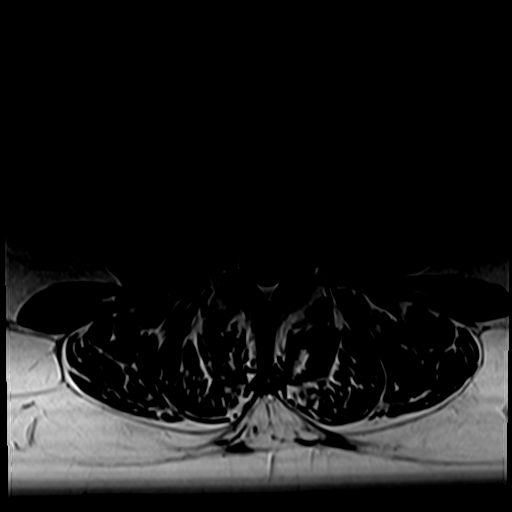
[im 19/37]
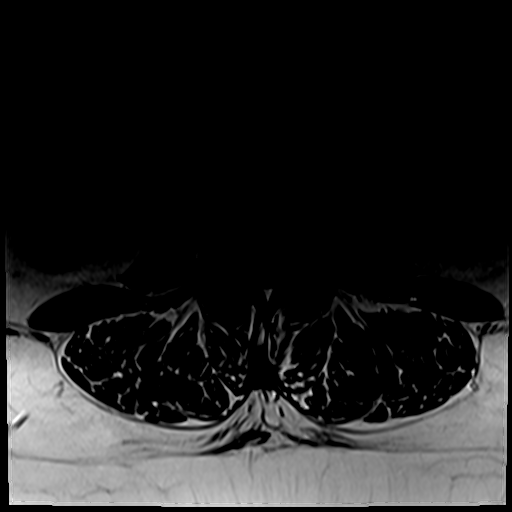
[im 21/37]
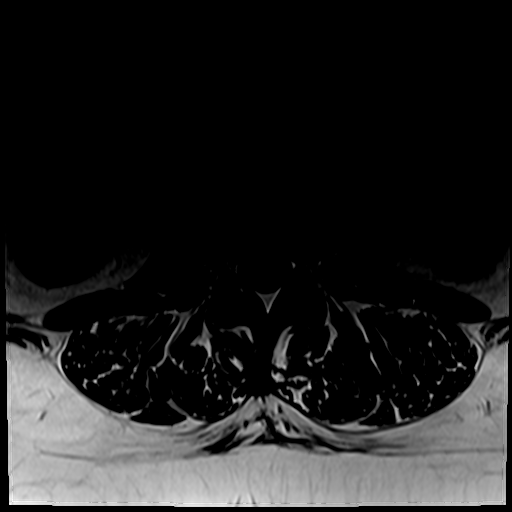
[im 26/37]
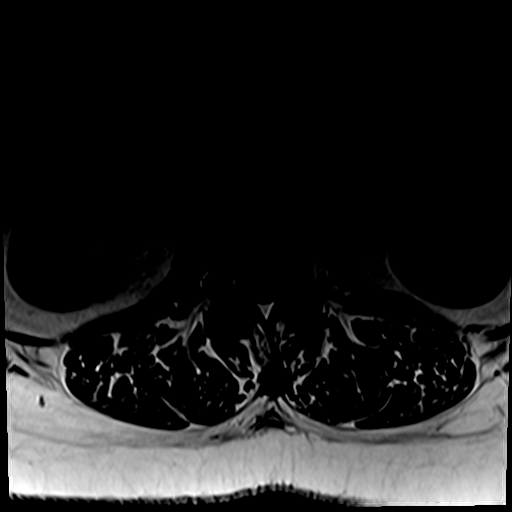
[im 31/37]
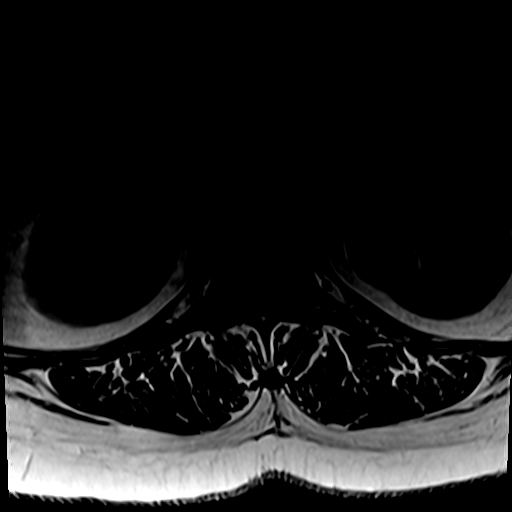
[im 37/37]
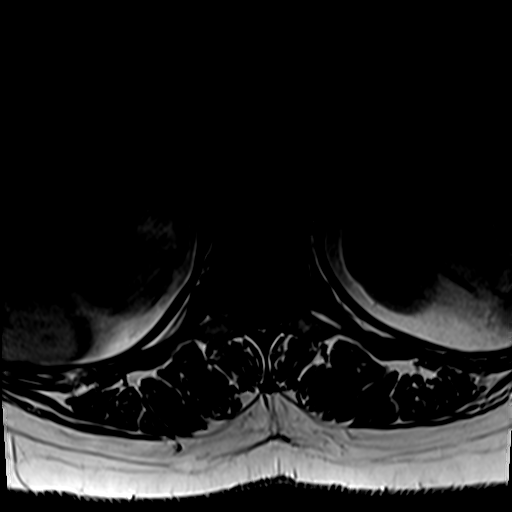

[31 of 48 positions shown; findings below may reference images not displayed]

FINDINGS: Segmentation:  Standard.

Alignment:  Physiologic.

Vertebrae: No fracture, evidence of discitis, or aggressive bone
lesion. There is a T1 and T2 hyperintense lesion within the L1
vertebral body with incomplete fat inversion on STIR, likely an
atypical hemangioma.

Conus medullaris and cauda equina: Conus extends to the L1/L2 level.
Conus and cauda equina appear normal.

Paraspinal and other soft tissues: There is a 2.8 cm cystic lesion
along the inferior edge of the field of view which is favored to
represent a dominant ovarian follicle as the ovarian vein leads
directly into this.

Disc levels: There is multilevel disc desiccation throughout the
lumbar spine. There is prominent posterior epidural fat throughout.

T12-L1: No significant spinal canal or neural foraminal narrowing.

L1-L2: No significant spinal canal or neural foraminal narrowing.

L2-L3: No significant spinal canal or neural foraminal narrowing.
Right greater than left facet arthropathy.

L3-L4: No significant spinal canal or neural foraminal narrowing.
Right greater than left facet arthropathy.

L4-L5: Mild disc bulging. No significant spinal canal or neural
foraminal narrowing.

L5-S1: Moderate to severe disc height loss with mild broad-based
disc bulging, ligamentum flavum hypertrophy and bilateral facet
arthropathy. Tiny annular fissure. No significant spinal canal
stenosis. No significant neural foraminal narrowing.
IMPRESSION: Multilevel degenerative disc disease and mild lower lumbar
predominant facet arthropathy, worst at L5-S1. No significant spinal
canal stenosis or neural foraminal narrowing. Tiny annular fissure
at L5-S1.

## 2021-01-10 ENCOUNTER — Telehealth: Payer: Self-pay | Admitting: Sports Medicine

## 2021-01-10 NOTE — Telephone Encounter (Signed)
I spoke with Christine [name] on the phone last week after reviewing MRI findings of her lumbar spine.  Although she does have significant degenerative disc disease and facet arthropathy at L5-S1 there is no evidence of spinal or foraminal stenosis.  Therefore her symptoms may all be originating from her foot and ankle.  She recently saw podiatry and they recommended surgical excision of a soft tissue mass but [name] is going to wait until the beginning of next year to have this done.  I recommended that she continue to follow-up with podiatry for this ongoing issue.  Follow up with me as needed.

## 2021-03-03 ENCOUNTER — Ambulatory Visit: Payer: Self-pay | Admitting: *Deleted

## 2021-03-03 NOTE — Telephone Encounter (Signed)
°  Chief Complaint: cough Symptoms: cough, productive mucus, incontinence, bad odor with urine Frequency: 3 weeks Pertinent Negatives: Patient denies fever Disposition: [] ED /[] Urgent Care (no appt availability in office) / [x] Appointment(In office/virtual)/ []  [facility]/ [] Home Care/ [] Refused Recommended Disposition /[] [facility]/ []  Follow-up with PCP Additional Notes: Pt states she has been taking Mucinex and hasn't helped with symptoms. Nothing was available for in office so scheduled pt for VV at 1600 03/04/21. Advised pt she may have to bring urine sample to office but she states that wouldn't be a problem. Care advice given and pt verbalized understanding. No other questions/concerns noted.     Reason for Disposition  Bad or foul-smelling urine  Answer Assessment - Initial Assessment Questions 1. SYMPTOM: "What's the main symptom you're concerned about?" (e.g., frequency, incontinence)     Incontinence when coughing 2. ONSET: "When did the  sx  start?"     3 weeks 3. PAIN: "Is there any pain?" If Yes, ask: "How bad is it?" (Scale: 1-10; mild, moderate, severe)     No 4. CAUSE: "What do you think is causing the symptoms?"     Unsure if UTI or BV 5. OTHER SYMPTOMS: "Do you have any other symptoms?" (e.g., fever, flank pain, blood in urine, pain with urination)     No  Answer Assessment - Initial Assessment Questions 1. ONSET: "When did the cough begin?"      2 weeks  3. SPUTUM: "Describe the color of your sputum" (none, dry cough; clear, white, yellow, green)     yellow 5. DIFFICULTY BREATHING: "Are you having difficulty breathing?" If Yes, ask: "How bad is it?" (e.g., mild, moderate, severe)    - MILD: No SOB at rest, mild SOB with walking, speaks normally in sentences, can lie down, no retractions, pulse < 100.    - MODERATE: SOB at rest, SOB with minimal exertion and prefers to sit, cannot lie down flat, speaks in phrases, mild retractions, audible  wheezing, pulse 100-120.    - SEVERE: Very SOB at rest, speaks in single words, struggling to breathe, sitting hunched forward, retractions, pulse > 120      No 6. FEVER: "Do you have a fever?" If Yes, ask: "What is your temperature, how was it measured, and when did it start?"     Did the 1st week but not now  10. OTHER SYMPTOMS: "Do you have any other symptoms?" (e.g., runny nose, wheezing, chest pain)       Head congestion  Protocols used: Cough - Acute Productive-A-AH, Urinary Symptoms-A-AH

## 2021-03-03 NOTE — Telephone Encounter (Signed)
I attempted to return her call.  She called in earlier today c/o cough and possible UTI for 3 wks.   There are no appts available. Left message to call back

## 2021-03-04 ENCOUNTER — Telehealth (INDEPENDENT_AMBULATORY_CARE_PROVIDER_SITE_OTHER): Payer: 59 | Admitting: Nurse Practitioner

## 2021-03-04 ENCOUNTER — Other Ambulatory Visit: Payer: Self-pay

## 2021-03-04 ENCOUNTER — Encounter: Payer: Self-pay | Admitting: Nurse Practitioner

## 2021-03-04 DIAGNOSIS — R300 Dysuria: Secondary | ICD-10-CM

## 2021-03-04 DIAGNOSIS — R051 Acute cough: Secondary | ICD-10-CM

## 2021-03-04 DIAGNOSIS — R350 Frequency of micturition: Secondary | ICD-10-CM | POA: Diagnosis not present

## 2021-03-04 DIAGNOSIS — R8290 Unspecified abnormal findings in urine: Secondary | ICD-10-CM | POA: Diagnosis not present

## 2021-03-04 MED ORDER — SULFAMETHOXAZOLE-TRIMETHOPRIM 800-160 MG PO TABS: 1.0000 | ORAL_TABLET | Freq: Two times a day (BID) | ORAL | 0 refills | Status: AC

## 2021-03-04 MED ORDER — BENZONATATE 100 MG PO CAPS: 200.0000 mg | ORAL_CAPSULE | Freq: Two times a day (BID) | ORAL | 0 refills | Status: DC | PRN

## 2021-03-04 NOTE — Progress Notes (Signed)
Name: Christine [name]   MRN: [id number]    DOB: [birth year]   Date:03/04/2021       Progress Note  Subjective  Chief Complaint  Chief Complaint  Patient presents with   Urinary Tract Infection    Strong odor   Cough    For 3 weeks    I connected with  [name]  on 03/04/21 at 3:30 pm by a video enabled telemedicine application and verified that I am speaking with the correct person using two identifiers.  I discussed the limitations of evaluation and management by telemedicine and the availability of in person appointments. The patient expressed understanding and agreed to proceed with a virtual visit  Staff also discussed with the patient that there may be a patient responsible charge related to this service. Patient Location: home Provider Location: [facility] Additional Individuals present: alone  HPI  UTI: She says she has had multiple UTIs.  She says that she has dysuria, foul smelling urine and urinary frequency that started about a week ago.  She denies any fever, chills or back pain. She says she cannot come in to give a urine sample.  Discussed that a urine culture is how we make sure we put you on the right antibiotic.  She verbalized understanding.  Discussed that if she does not improve she will have follow-up and we would need a urine sample.   Cough: She says she has had a cough for almost three weeks.  She says she started with a head cold and now she has this cough that will not go away.  She says she is not taking anything for it.  She said she was taking mucinex but it did not help.  Discussed the role of mucinex and encouraged her to continue taking it.  She denies any shortness of breath or fever.  Will send in tessalon perls and discussed OTC treatments for symptoms.   Patient Active Problem List   Diagnosis Date Noted   Perimenopausal vasomotor symptoms 10/08/2020   Class 3 severe obesity with body mass index (BMI) of 40.0 to 44.9 in adult ([place]) 10/08/2020   S/P  hysterectomy 02/03/2020   Migraine without status migrainosus, not intractable 01/09/2020   Tobacco use disorder 04/15/2018   Multiple personality disorder ([place]) 01/03/2018   Mild episode of recurrent major depressive disorder ([place]) 01/03/2018   Generalized anxiety disorder 01/03/2018    Social History   Tobacco Use   Smoking status: Every Day    Packs/day: 0.25    Years: 30.00    Pack years: 7.50    Types: Cigarettes   Smokeless tobacco: Never   Tobacco comments:    Heavy smoker for most of her years of smoking  Substance Use Topics   Alcohol use: No     Current Outpatient Medications:    buPROPion (WELLBUTRIN XL) 150 MG 24 hr tablet, Take 1 tablet (150 mg total) by mouth daily., Disp: 90 tablet, Rfl: 3   cloNIDine (CATAPRES) 0.1 MG tablet, Take 1 tablet (0.1 mg total) by mouth at bedtime as needed (for hot flashes/sweats)., Disp: 30 tablet, Rfl: 11   meloxicam (MOBIC) 15 MG tablet, Take 1 tablet daily with food for 7 days. Then take as needed., Disp: 40 tablet, Rfl: 0   promethazine (PHENERGAN) 25 MG suppository, Place 1 suppository (25 mg total) rectally every 6 (six) hours as needed for nausea or vomiting., Disp: 6 each, Rfl: 1   rizatriptan (MAXALT-MLT) 10 MG disintegrating tablet, Take 1 tablet (  10 mg total) by mouth as needed for migraine. May repeat in 2 hours if needed, Disp: 10 tablet, Rfl: 5   predniSONE (DELTASONE) 10 MG tablet, Use as directed per doctors orders. (Patient not taking: Reported on 03/04/2021), Disp: 21 tablet, Rfl: 0  Allergies  Allergen Reactions   Zoloft [Sertraline Hcl]     Unpleasant thoughts    I personally reviewed active problem list, medication list, allergies, notes from last encounter with the patient/caregiver today.  ROS  Constitutional: Negative for fever or weight change.  Respiratory:Positive for cough, negative for shortness of breath.   Cardiovascular: Negative for chest pain or palpitations.  Gastrointestinal: Negative for  abdominal pain, no bowel changes. Gentirurinary: positive for urinary frequency, foul smelling urine, dysuria Musculoskeletal: Negative for gait problem or joint swelling.  Skin: Negative for rash.  Neurological: Negative for dizziness or headache.  No other specific complaints in a complete review of systems (except as listed in HPI above).   Objective  Virtual encounter, vitals not obtained.  There is no height or weight on file to calculate BMI.  Nursing Note and Vital Signs reviewed.  Physical Exam  Awake, alert and oriented, speaking in complete sentences  No results found for this or any previous visit (from the past 72 hour(s)).  Assessment & Plan  1. Foul smelling urine -push fluids -if no improvement will have to come in and give urine sample - sulfamethoxazole-trimethoprim (BACTRIM DS) 800-160 MG tablet; Take 1 tablet by mouth 2 (two) times daily for 5 days.  Dispense: 10 tablet; Refill: 0  2. Dysuria -push fluids -if no improvement will have to come in and give urine sample - sulfamethoxazole-trimethoprim (BACTRIM DS) 800-160 MG tablet; Take 1 tablet by mouth 2 (two) times daily for 5 days.  Dispense: 10 tablet; Refill: 0  3. Urine frequency -push fluids -if no improvement will have to come in and give urine sample - sulfamethoxazole-trimethoprim (BACTRIM DS) 800-160 MG tablet; Take 1 tablet by mouth 2 (two) times daily for 5 days.  Dispense: 10 tablet; Refill: 0  4. Acute cough -push fluids -OTC treatments for symptoms - benzonatate (TESSALON) 100 MG capsule; Take 2 capsules (200 mg total) by mouth 2 (two) times daily as needed for cough.  Dispense: 20 capsule; Refill: 0    -Red flags and when to present for emergency care or RTC including fever >101.24F, chest pain, shortness of breath, new/worsening/un-resolving symptoms, reviewed with patient at time of visit. Follow up and care instructions discussed and provided in AVS. - I discussed the assessment and  treatment plan with the patient. The patient was provided an opportunity to ask questions and all were answered. The patient agreed with the plan and demonstrated an understanding of the instructions.  I provided 15 minutes of non-face-to-face time during this encounter.  Bo Merino, FNP

## 2021-03-31 ENCOUNTER — Other Ambulatory Visit: Payer: Self-pay | Admitting: Family Medicine

## 2021-03-31 DIAGNOSIS — N951 Menopausal and female climacteric states: Secondary | ICD-10-CM

## 2021-05-05 ENCOUNTER — Other Ambulatory Visit: Payer: Self-pay | Admitting: Family Medicine

## 2021-05-05 DIAGNOSIS — N951 Menopausal and female climacteric states: Secondary | ICD-10-CM

## 2021-06-06 ENCOUNTER — Other Ambulatory Visit: Payer: Self-pay

## 2021-06-06 DIAGNOSIS — Z1231 Encounter for screening mammogram for malignant neoplasm of breast: Secondary | ICD-10-CM

## 2021-08-10 ENCOUNTER — Ambulatory Visit: Payer: Self-pay | Admitting: Family Medicine

## 2023-12-11 ENCOUNTER — Ambulatory Visit: Admitting: Family Medicine

## 2023-12-11 ENCOUNTER — Ambulatory Visit: Payer: Self-pay

## 2023-12-11 ENCOUNTER — Encounter: Payer: Self-pay | Admitting: Family Medicine

## 2023-12-11 VITALS — BP 134/84 | HR 98 | Resp 16 | Ht 67.0 in | Wt 261.7 lb

## 2023-12-11 DIAGNOSIS — G43011 Migraine without aura, intractable, with status migrainosus: Secondary | ICD-10-CM | POA: Diagnosis not present

## 2023-12-11 DIAGNOSIS — Z1231 Encounter for screening mammogram for malignant neoplasm of breast: Secondary | ICD-10-CM

## 2023-12-11 DIAGNOSIS — G43909 Migraine, unspecified, not intractable, without status migrainosus: Secondary | ICD-10-CM

## 2023-12-11 MED ORDER — BACLOFEN 10 MG PO TABS: 10.0000 mg | ORAL_TABLET | Freq: Three times a day (TID) | ORAL | 0 refills | Status: DC | PRN

## 2023-12-11 MED ORDER — PROMETHAZINE HCL 25 MG RE SUPP: 25.0000 mg | Freq: Four times a day (QID) | RECTAL | 1 refills | Status: DC | PRN

## 2023-12-11 MED ORDER — NURTEC 75 MG PO TBDP: 1.0000 | ORAL_TABLET | Freq: Every day | ORAL | 0 refills | Status: DC | PRN

## 2023-12-11 MED ORDER — QULIPTA 60 MG PO TABS: 1.0000 | ORAL_TABLET | Freq: Every day | ORAL | 0 refills | Status: DC

## 2023-12-11 MED ORDER — PREDNISONE 20 MG PO TABS: 20.0000 mg | ORAL_TABLET | Freq: Two times a day (BID) | ORAL | 0 refills | Status: DC

## 2023-12-11 NOTE — Telephone Encounter (Signed)
 FYI Only or Action Required?: FYI only for provider.  Patient was last seen in primary care on 03/04/2021 by Gareth Mliss FALCON, FNP.  Called Nurse Triage reporting Migraine.  Symptoms began several days ago.  Interventions attempted: OTC medications: Excedrin Migraine.  Symptoms are: unchanged.  Triage Disposition: See HCP Within 4 Hours (Or PCP Triage)  Patient/caregiver understands and will follow disposition?: Yes          Copied from CRM [id number]. Topic: Clinical - Red Word Triage >> Dec 11, 2023 10:58 AM Christine [name] wrote: [facility] that prompted transfer to Nurse Triage: Patient states that her migraines have been getting worse and has one currently. Has pain in her neck and upper arms. Reason for Disposition  [1] SEVERE headache (e.g., excruciating) AND [2] not improved after 2 hours of pain medicine  Answer Assessment - Initial Assessment Questions 1. LOCATION: Where does it hurt?      Forehead and cheekbones 2. ONSET: When did the headache start? (e.g., minutes, hours, days)      Sunday 3. PATTERN: Does the pain come and go, or has it been constant since it started?     constant 4. SEVERITY: How bad is the pain? and What does it keep you from doing?  (e.g., Scale 1-10; mild, moderate, or severe)     7/10 Excedrin migrain without relief 5. RECURRENT SYMPTOM: Have you ever had headaches before? If Yes, ask: When was the last time? and What happened that time?      yes 6. CAUSE: What do you think is causing the headache?     migraine 7. MIGRAINE: Have you been diagnosed with migraine headaches? If Yes, ask: Is this headache similar?      Yes, has seen several neurologist 8. HEAD INJURY: Has there been any recent injury to your head?      denies 9. OTHER SYMPTOMS: Do you have any other symptoms? (e.g., fever, stiff neck, eye pain, sore throat, cold symptoms)     Back of neck is sore/tender to touch, upper muscle/bone aching, bilateral upper  arm numbness/pain when sleeping 10. PREGNANCY: Is there any chance you are pregnant? When was your last menstrual period?       N/a  Protocols used: Headache-A-AH

## 2023-12-11 NOTE — Progress Notes (Signed)
 Name: Christine [name]   MRN: [id number]    DOB: 06-09-1970   Date:12/11/2023       Progress Note  Subjective  Chief Complaint  Chief Complaint  Patient presents with   Migraine    Hx of migraines, started Sunday no relief   Discussed the use of AI scribe software for clinical note transcription with the patient, who gave verbal consent to proceed.  History of Present Illness Christine [name] is a 53 year old female with a long history of migraines who presents with a different migraine episode characterized by neck and arm pain.  She has a long-standing history of migraines, typically presenting with pain between her eyes and cheekbones, and occasionally on the left parietal area. Her current episode is different, as it includes pain in her neck and upper arms, specifically in the middle of the arm, not extending to the shoulder or elbow. The intensity of the migraine is typical, but the location and associated symptoms are unusual for her.  She experiences nausea and vomiting, having vomited yesterday and not eaten today. No chest pain, shortness of breath, double vision, weakness, tingling, or numbness. She experiences photophobia and phonophobia, with light and sound exacerbating her symptoms. She also notes a dry nose, which was more pronounced yesterday.  Her migraines used to be preceded by warning signs, such as aching thighs, particularly before her menstrual cycle, but these have ceased following her hysterectomy. She now identifies weather changes as a trigger for her migraines. She experiences migraines once or twice a week.  In the past, she has tried various treatments including Maxalt , Imitrex , tramadol , injections, and sphenocath, but none have provided significant relief. She has not taken any migraine medications recently due to a lack of insurance coverage. She has previously used promethazine  suppositories for nausea associated with migraines.      Patient Active Problem  List   Diagnosis Date Noted   Perimenopausal vasomotor symptoms 10/08/2020   Class 3 severe obesity with body mass index (BMI) of 40.0 to 44.9 in adult ([facility]) 10/08/2020   S/P hysterectomy 02/03/2020   Migraine without status migrainosus, not intractable 01/09/2020   Tobacco use disorder 04/15/2018   Multiple personality disorder ([facility]) 01/03/2018   Mild episode of recurrent major depressive disorder 01/03/2018   Generalized anxiety disorder 01/03/2018    Past Surgical History:  Procedure Laterality Date   BREAST BIOPSY N/A early 2000s   benign   CESAREAN SECTION  1994 , 1997   x 2    CYSTOSCOPY N/A 02/03/2020   Procedure: CYSTOSCOPY;  Surgeon: Lake Read, MD;  Location: [facility];  Service: Gynecology;  Laterality: N/A;   DILITATION & CURRETTAGE/HYSTROSCOPY WITH THERMACHOICE ABLATION N/A 01/13/2014   Procedure: DILATATION & CURETTAGE/HYSTEROSCOPY WITH THERMACHOICE ABLATION;  Surgeon: Norleen Edsel GAILS, MD;  Location: [facility];  Service: Gynecology;  Laterality: N/A;  uteral sound is to 11cm,49ml of D5W in, 22ml out. temp:87 degree celcious total therapy time-87min, 57 sec   TOTAL LAPAROSCOPIC HYSTERECTOMY WITH SALPINGECTOMY Bilateral 02/03/2020   Procedure: TOTAL LAPAROSCOPIC HYSTERECTOMY WITH SALPINGECTOMY;  Surgeon: Lake Read, MD;  Location: [facility];  Service: Gynecology;  Laterality: Bilateral;   TUBAL LIGATION      Family History  Problem Relation Age of Onset   Hypertension Mother    Heart attack Father    Stroke Maternal Grandmother    Heart attack Maternal Grandfather     Social History   Tobacco Use   Smoking status: Every Day    Current packs/day:  0.25    Average packs/day: 0.3 packs/day for 30.0 years (7.5 ttl pk-yrs)    Types: Cigarettes   Smokeless tobacco: Never   Tobacco comments:    Heavy smoker for most of her years of smoking  Substance Use Topics   Alcohol use: No     Current Outpatient Medications:    Atogepant (QULIPTA) 60 MG TABS, Take  1 tablet (60 mg total) by mouth daily. For prevention, Disp: 90 tablet, Rfl: 0   baclofen  (LIORESAL ) 10 MG tablet, Take 1 tablet (10 mg total) by mouth 3 (three) times daily as needed for muscle spasms., Disp: 30 each, Rfl: 0   predniSONE  (DELTASONE ) 20 MG tablet, Take 1 tablet (20 mg total) by mouth 2 (two) times daily with a meal., Disp: 6 tablet, Rfl: 0   Rimegepant Sulfate (NURTEC) 75 MG TBDP, Take 1 tablet (75 mg total) by mouth daily as needed. For break through migraine, Disp: 16 tablet, Rfl: 0   promethazine  (PHENERGAN ) 25 MG suppository, Place 1 suppository (25 mg total) rectally every 6 (six) hours as needed for nausea or vomiting., Disp: 6 each, Rfl: 1  Allergies  Allergen Reactions   Zoloft [Sertraline Hcl]     Unpleasant thoughts    I personally reviewed active problem list, medication list, allergies, family history with the patient/caregiver today.   ROS  Ten systems reviewed and is negative except as mentioned in HPI    Objective Physical Exam CONSTITUTIONAL: Patient appears well-developed and well-nourished. No distress. HEENT: Head atraumatic, normocephalic, neck supple. CARDIOVASCULAR: Normal rate, regular rhythm and normal heart sounds. No murmur heard. No BLE edema. PULMONARY: Effort normal and breath sounds normal. No respiratory distress. ABDOMINAL: There is no tenderness or distention. MUSCULOSKELETAL: Normal gait. Without gross motor or sensory deficit. PSYCHIATRIC: Patient has a normal mood and affect. Behavior is normal. Judgment and thought content normal. NEUROLOGICAL: Left eye lid  appears slightly lower. Extraocular movements intact. Tenderness on palpation of trapezius muscle. Proprioception intact. Romberg negative . No nuchal rigidity   Vitals:   12/11/23 1527  BP: 134/84  Pulse: 98  Resp: 16  SpO2: 100%  Weight: 261 lb 11.2 oz (118.7 kg)  Height: 5' 7 (1.702 m)    Body mass index is 40.99 kg/m.  No results found for this or any previous  visit (from the past 2160 hours).  Diabetic Foot Exam:     PHQ2/9:    12/11/2023    3:24 PM 03/04/2021   11:57 AM 10/20/2020    1:28 PM 10/08/2020    1:21 PM 01/09/2020    1:34 PM  Depression screen PHQ 2/9  Decreased Interest 0 0 0 0 2  Down, Depressed, Hopeless 0 0 0 0 2  PHQ - 2 Score 0 0 0 0 4  Altered sleeping  0 0 0 0  Tired, decreased energy  0 0 0 2  Change in appetite  0 0 0 0  Feeling bad or failure about yourself   0 0 0 1  Trouble concentrating  0 0 0 1  Moving slowly or fidgety/restless  0 0 0 1  Suicidal thoughts  0 0 0 0  PHQ-9 Score  0 0 0 9  Difficult doing work/chores  Not difficult at all Not difficult at all Not difficult at all Somewhat difficult    phq 9 is negative  Fall Risk:    12/11/2023    3:24 PM 03/04/2021   11:57 AM 10/20/2020    1:28 PM 10/08/2020  1:21 PM 01/09/2020    1:19 PM  Fall Risk   Falls in the past year? 0 0 0 0 0  Number falls in past yr: 0 0 0 0 0  Injury with Fall? 0 0 0 0 0  Risk for fall due to : No Fall Risks      Follow up Falls evaluation completed Falls evaluation completed   Falls evaluation completed  Falls evaluation completed      Data saved with a previous flowsheet row definition      Assessment & Plan Migraine without aura, intractable, with status migrainosus Chronic migraines with status migrainosus, presenting with neck and upper arm pain, nausea, and photophobia. Previous treatments ineffective. Discussed Nurtec and Qulipta for management. Prednisone  and baclofen  considered for inflammation and muscle relaxation. - Prescribe Nurtec for acute migraine management. - Prescribe Qulipta for migraine prevention. - Administer prednisone , 6 pills over 3 days, for acute inflammation control. - Prescribe baclofen  for muscle relaxation. - Prescribe promethazine  suppositories for nausea management.  General Health Maintenance Due for routine screenings and vaccinations. Discussed hydration to prevent migraines. Flu  shot given at work. - Order mammogram. - Schedule physical examination and labs. - Encourage increased water intake.

## 2023-12-12 ENCOUNTER — Telehealth: Payer: Self-pay | Admitting: Pharmacy Technician

## 2023-12-12 ENCOUNTER — Other Ambulatory Visit (HOSPITAL_COMMUNITY): Payer: Self-pay

## 2023-12-12 NOTE — Telephone Encounter (Signed)
 Pharmacy Patient Advocate Encounter   Received notification from CoverMyMeds that prior authorization for Nurtec 75MG  dispersible tablets is required/requested.   Insurance verification completed.   The patient is insured through [employer].   Per test claim: PA required and submitted KEY/EOC/Request #: [id number] from 12/12/23 to 12/11/24. Ran test claim, Copay is $0.00. This test claim was processed through [facility]- copay amounts may vary at other pharmacies due to pharmacy/plan contracts, or as the patient moves through the different stages of their insurance plan.

## 2023-12-12 NOTE — Telephone Encounter (Signed)
 Pharmacy Patient Advocate Encounter   Received notification from CoverMyMeds that prior authorization for Qulipta 60MG  tablets is required/requested.   Insurance verification completed.   The patient is insured through [employer].   Per test claim: PA required and submitted KEY/EOC/Request #: [id number] from 12/12/23 to 12/11/24. Ran test claim, Copay is $0.00. This test claim was processed through [facility]- copay amounts may vary at other pharmacies due to pharmacy/plan contracts, or as the patient moves through the different stages of their insurance plan.

## 2024-02-08 NOTE — Patient Instructions (Signed)
 Preventive Care 54-54 Years Old, 53  Preventive care refers to lifestyle choices and visits with your health care provider that can promote health and wellness. Preventive care visits are also called wellness exams.  What can I expect for my preventive care visit?  Counseling  Your health care provider may ask you questions about your:  Medical history, including:  Past medical problems.  Family medical history.  Pregnancy history.  Current health, including:  Menstrual cycle.  Method of birth control.  Emotional well-being.  Home life and relationship well-being.  Sexual activity and sexual health.  Lifestyle, including:  Alcohol, nicotine or tobacco, and drug use.  Access to firearms.  Diet, exercise, and sleep habits.  Work and work Astronomer.  Sunscreen use.  Safety issues such as seatbelt and bike helmet use.  Physical exam  Your health care provider will check your:  Height and weight. These may be used to calculate your BMI (body mass index). BMI is a measurement that tells if you are at a healthy weight.  Waist circumference. This measures the distance around your waistline. This measurement also tells if you are at a healthy weight and may help predict your risk of certain diseases, such as type 2 diabetes and high blood pressure.  Heart rate and blood pressure.  Body temperature.  Skin for abnormal spots.  What immunizations do I need?    Vaccines are usually given at various ages, according to a schedule. Your health care provider will recommend vaccines for you based on your age, medical history, and lifestyle or other factors, such as travel or where you work.  What tests do I need?  Screening  Your health care provider may recommend screening tests for certain conditions. This may include:  Lipid and cholesterol levels.  Diabetes screening. This is done by checking your blood sugar (glucose) after you have not eaten for a while (fasting).  Pelvic exam and Pap test.  Hepatitis B test.  Hepatitis C  test.  HIV (human immunodeficiency virus) test.  STI (sexually transmitted infection) testing, if you are at risk.  Lung cancer screening.  Colorectal cancer screening.  Mammogram. Talk with your health care provider about when you should start having regular mammograms. This may depend on whether you have a family history of breast cancer.  BRCA-related cancer screening. This may be done if you have a family history of breast, ovarian, tubal, or peritoneal cancers.  Bone density scan. This is done to screen for osteoporosis.  Talk with your health care provider about your test results, treatment options, and if necessary, the need for more tests.  Follow these instructions at home:  Eating and drinking    Eat a diet that includes fresh fruits and vegetables, whole grains, lean protein, and low-fat dairy products.  Take vitamin and mineral supplements as recommended by your health care provider.  Do not drink alcohol if:  Your health care provider tells you not to drink.  You are pregnant, may be pregnant, or are planning to become pregnant.  If you drink alcohol:  Limit how much you have to 0-1 drink a day.  Know how much alcohol is in your drink. In the [place] one drink equals one 12 oz bottle of beer (355 mL), one 5 oz glass of wine (148 mL), or one 1 oz glass of hard liquor (44 mL).  Lifestyle  Brush your teeth every morning and night with fluoride toothpaste. Floss one time each day.  Exercise for at least  30 minutes 5 or more days each week.  Do not use any products that contain nicotine or tobacco. These products include cigarettes, chewing tobacco, and vaping devices, such as e-cigarettes. If you need help quitting, ask your health care provider.  Do not use drugs.  If you are sexually active, practice safe sex. Use a condom or other form of protection to prevent STIs.  If you do not wish to become pregnant, use a form of birth control. If you plan to become pregnant, see your health care provider for a  prepregnancy visit.  Take aspirin only as told by your health care provider. Make sure that you understand how much to take and what form to take. Work with your health care provider to find out whether it is safe and beneficial for you to take aspirin daily.  Find healthy ways to manage stress, such as:  Meditation, yoga, or listening to music.  Journaling.  Talking to a trusted person.  Spending time with friends and family.  Minimize exposure to UV radiation to reduce your risk of skin cancer.  Safety  Always wear your seat belt while driving or riding in a vehicle.  Do not drive:  If you have been drinking alcohol. Do not ride with someone who has been drinking.  When you are tired or distracted.  While texting.  If you have been using any mind-altering substances or drugs.  Wear a helmet and other protective equipment during sports activities.  If you have firearms in your house, make sure you follow all gun safety procedures.  Seek help if you have been physically or sexually abused.  What's next?  Visit your health care provider once a year for an annual wellness visit.  Ask your health care provider how often you should have your eyes and teeth checked.  Stay up to date on all vaccines.  This information is not intended to replace advice given to you by your health care provider. Make sure you discuss any questions you have with your health care provider.  Document Revised: 08/11/2020 Document Reviewed: 08/11/2020  Elsevier Patient Education  2024 [employer].

## 2024-02-11 ENCOUNTER — Other Ambulatory Visit: Payer: Self-pay

## 2024-02-11 ENCOUNTER — Ambulatory Visit: Admitting: Family Medicine

## 2024-02-11 ENCOUNTER — Encounter: Payer: Self-pay | Admitting: Family Medicine

## 2024-02-11 ENCOUNTER — Other Ambulatory Visit (HOSPITAL_COMMUNITY)
Admission: RE | Admit: 2024-02-11 | Discharge: 2024-02-11 | Disposition: A | Source: Ambulatory Visit | Attending: Family Medicine | Admitting: Family Medicine

## 2024-02-11 ENCOUNTER — Other Ambulatory Visit: Payer: Self-pay | Admitting: Family Medicine

## 2024-02-11 VITALS — BP 124/78 | HR 86 | Resp 16 | Ht 67.0 in | Wt 257.6 lb

## 2024-02-11 DIAGNOSIS — G43011 Migraine without aura, intractable, with status migrainosus: Secondary | ICD-10-CM

## 2024-02-11 DIAGNOSIS — Z79899 Other long term (current) drug therapy: Secondary | ICD-10-CM

## 2024-02-11 DIAGNOSIS — B9689 Other specified bacterial agents as the cause of diseases classified elsewhere: Secondary | ICD-10-CM | POA: Insufficient documentation

## 2024-02-11 DIAGNOSIS — L292 Pruritus vulvae: Secondary | ICD-10-CM | POA: Insufficient documentation

## 2024-02-11 DIAGNOSIS — Z131 Encounter for screening for diabetes mellitus: Secondary | ICD-10-CM

## 2024-02-11 DIAGNOSIS — Z1211 Encounter for screening for malignant neoplasm of colon: Secondary | ICD-10-CM

## 2024-02-11 DIAGNOSIS — Z13220 Encounter for screening for lipoid disorders: Secondary | ICD-10-CM

## 2024-02-11 DIAGNOSIS — Z1159 Encounter for screening for other viral diseases: Secondary | ICD-10-CM | POA: Insufficient documentation

## 2024-02-11 DIAGNOSIS — G43909 Migraine, unspecified, not intractable, without status migrainosus: Secondary | ICD-10-CM

## 2024-02-11 DIAGNOSIS — Z130 Encounter for screening for diseases of the blood and blood-forming organs and certain disorders involving the immune mechanism: Secondary | ICD-10-CM

## 2024-02-11 DIAGNOSIS — N760 Acute vaginitis: Secondary | ICD-10-CM | POA: Insufficient documentation

## 2024-02-11 DIAGNOSIS — E559 Vitamin D deficiency, unspecified: Secondary | ICD-10-CM

## 2024-02-11 DIAGNOSIS — Z113 Encounter for screening for infections with a predominantly sexual mode of transmission: Secondary | ICD-10-CM | POA: Insufficient documentation

## 2024-02-11 DIAGNOSIS — N6311 Unspecified lump in the right breast, upper outer quadrant: Secondary | ICD-10-CM | POA: Diagnosis not present

## 2024-02-11 DIAGNOSIS — Z01411 Encounter for gynecological examination (general) (routine) with abnormal findings: Secondary | ICD-10-CM

## 2024-02-11 DIAGNOSIS — Z23 Encounter for immunization: Secondary | ICD-10-CM

## 2024-02-11 DIAGNOSIS — E538 Deficiency of other specified B group vitamins: Secondary | ICD-10-CM

## 2024-02-11 DIAGNOSIS — Z01419 Encounter for gynecological examination (general) (routine) without abnormal findings: Secondary | ICD-10-CM

## 2024-02-11 MED ORDER — PROMETHAZINE HCL 25 MG RE SUPP: 25.0000 mg | Freq: Four times a day (QID) | RECTAL | 1 refills | Status: DC | PRN
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
## 2024-02-12 LAB — VITAMIN D 25 HYDROXY (VIT D DEFICIENCY, FRACTURES): Vit D, 25-Hydroxy: 31 ng/mL (ref 30–100)

## 2024-02-12 LAB — HEMOGLOBIN A1C
Hgb A1c MFr Bld: 5.5 % (ref <5.7)
Mean Plasma Glucose: 111 mg/dL
eAG (mmol/L): 6.2 mmol/L

## 2024-02-12 LAB — CBC WITH DIFFERENTIAL/PLATELET
Absolute Lymphocytes: 3021 {cells}/uL (ref 850–3900)
Absolute Monocytes: 721 {cells}/uL (ref 200–950)
Basophils Absolute: 95 {cells}/uL (ref 0–200)
Basophils Relative: 0.9 %
Eosinophils Absolute: 265 {cells}/uL (ref 15–500)
Eosinophils Relative: 2.5 %
HCT: 43.1 % (ref 35.9–46.0)
Hemoglobin: 14.1 g/dL (ref 11.7–15.5)
MCH: 28.6 pg (ref 27.0–33.0)
MCHC: 32.7 g/dL (ref 31.6–35.4)
MCV: 87.4 fL (ref 81.4–101.7)
MPV: 12.6 fL — ABNORMAL HIGH (ref 7.5–12.5)
Monocytes Relative: 6.8 %
Neutro Abs: 6498 {cells}/uL (ref 1500–7800)
Neutrophils Relative %: 61.3 %
Platelets: 292 Thousand/uL (ref 140–400)
RBC: 4.93 Million/uL (ref 3.80–5.10)
RDW: 12.7 % (ref 11.0–15.0)
Total Lymphocyte: 28.5 %
WBC: 10.6 Thousand/uL (ref 3.8–10.8)

## 2024-02-12 LAB — COMPREHENSIVE METABOLIC PANEL WITH GFR
AG Ratio: 1.5 (calc) (ref 1.0–2.5)
ALT: 14 U/L (ref 6–29)
AST: 13 U/L (ref 10–35)
Albumin: 4.5 g/dL (ref 3.6–5.1)
Alkaline phosphatase (APISO): 91 U/L (ref 37–153)
BUN: 16 mg/dL (ref 7–25)
CO2: 27 mmol/L (ref 20–32)
Calcium: 9.7 mg/dL (ref 8.6–10.4)
Chloride: 103 mmol/L (ref 98–110)
Creat: 0.81 mg/dL (ref 0.50–1.03)
Globulin: 3 g/dL (ref 1.9–3.7)
Glucose, Bld: 88 mg/dL (ref 65–99)
Potassium: 4.1 mmol/L (ref 3.5–5.3)
Sodium: 141 mmol/L (ref 135–146)
Total Bilirubin: 0.3 mg/dL (ref 0.2–1.2)
Total Protein: 7.5 g/dL (ref 6.1–8.1)
eGFR: 86 mL/min/1.73m2 (ref >=60)

## 2024-02-12 LAB — HIV ANTIBODY (ROUTINE TESTING W REFLEX)
HIV 1&2 Ab, 4th Generation: NONREACTIVE
HIV FINAL INTERPRETATION: NEGATIVE

## 2024-02-12 LAB — B12 AND FOLATE PANEL
Folate: 5 ng/mL — ABNORMAL LOW
Vitamin B-12: 279 pg/mL (ref 200–1100)

## 2024-02-12 LAB — CERVICOVAGINAL ANCILLARY ONLY
Bacterial Vaginitis (gardnerella): NEGATIVE
Candida Glabrata: NEGATIVE
Candida Vaginitis: NEGATIVE
Chlamydia: NEGATIVE
Comment: NEGATIVE
Comment: NEGATIVE
Comment: NEGATIVE
Comment: NEGATIVE
Comment: NEGATIVE
Comment: NORMAL
Neisseria Gonorrhea: NEGATIVE
Trichomonas: NEGATIVE

## 2024-02-12 LAB — LIPID PANEL
Cholesterol: 182 mg/dL (ref <200)
HDL: 47 mg/dL — ABNORMAL LOW (ref >=50)
LDL Cholesterol (Calc): 115 mg/dL — ABNORMAL HIGH
Non-HDL Cholesterol (Calc): 135 mg/dL — ABNORMAL HIGH (ref <130)
Total CHOL/HDL Ratio: 3.9 (calc) (ref <5.0)
Triglycerides: 96 mg/dL (ref <150)

## 2024-02-12 LAB — SYPHILIS: RPR W/REFLEX TO RPR TITER AND TREPONEMAL ANTIBODIES, TRADITIONAL SCREENING AND DIAGNOSIS ALGORITHM: RPR Ser Ql: NONREACTIVE

## 2024-02-12 LAB — HEPATITIS B SURFACE ANTIBODY,QUALITATIVE: Hep B S Ab: REACTIVE — AB

## 2024-02-13 ENCOUNTER — Other Ambulatory Visit: Payer: Self-pay | Admitting: Family Medicine

## 2024-02-13 ENCOUNTER — Ambulatory Visit: Payer: Self-pay | Admitting: Family Medicine

## 2024-02-13 ENCOUNTER — Ambulatory Visit
Admission: RE | Admit: 2024-02-13 | Discharge: 2024-02-13 | Disposition: A | Discharge status: Home / Self Care | Source: Ambulatory Visit | Attending: Family Medicine | Admitting: Family Medicine

## 2024-02-13 ENCOUNTER — Inpatient Hospital Stay: Admission: RE | Admit: 2024-02-13 | Discharge: 2024-02-13 | Attending: Family Medicine | Admitting: Family Medicine

## 2024-02-13 DIAGNOSIS — R928 Other abnormal and inconclusive findings on diagnostic imaging of breast: Secondary | ICD-10-CM

## 2024-02-13 DIAGNOSIS — N6311 Unspecified lump in the right breast, upper outer quadrant: Secondary | ICD-10-CM

## 2024-02-14 ENCOUNTER — Telehealth: Payer: Self-pay

## 2024-02-14 DIAGNOSIS — Z1211 Encounter for screening for malignant neoplasm of colon: Secondary | ICD-10-CM

## 2024-02-14 MED ORDER — NA SULFATE-K SULFATE-MG SULF 17.5-3.13-1.6 GM/177ML PO SOLN: 1.0000 | Freq: Once | ORAL | 0 refills | Status: AC

## 2024-02-14 NOTE — Telephone Encounter (Signed)
 Gastroenterology Pre-Procedure Review  Request Date: 05/20/24 Requesting Physician: Dr. Melany  PATIENT REVIEW QUESTIONS: The patient responded to the following health history questions as indicated:    1. Are you having any GI issues? no 2. Do you have a personal history of Polyps? no 3. Do you have a family history of Colon Cancer or Polyps? no 4. Diabetes Mellitus? no 5. Joint replacements in the past 12 months?no 6. Major health problems in the past 3 months?no 7. Any artificial heart valves, MVP, or defibrillator?no    MEDICATIONS & ALLERGIES:    Patient reports the following regarding taking any anticoagulation/antiplatelet therapy:   Plavix, Coumadin, Eliquis, Xarelto, Lovenox, Pradaxa, Brilinta, or Effient? no Aspirin? no  Patient confirms/reports the following medications:  Current Outpatient Medications  Medication Sig Dispense Refill   Atogepant  (QULIPTA ) 60 MG TABS Take 1 tablet (60 mg total) by mouth daily. For prevention 90 tablet 0   baclofen  (LIORESAL ) 10 MG tablet Take 1 tablet (10 mg total) by mouth 3 (three) times daily as needed for muscle spasms. 30 each 0   promethazine  (PHENERGAN ) 25 MG suppository Place 1 suppository (25 mg total) rectally every 6 (six) hours as needed for nausea or vomiting. 6 each 1   Rimegepant Sulfate (NURTEC) 75 MG TBDP Take 1 tablet (75 mg total) by mouth daily as needed. For break through migraine 16 tablet 0   No current facility-administered medications for this visit.    Patient confirms/reports the following allergies:  Allergies[1]  No orders of the defined types were placed in this encounter.   AUTHORIZATION INFORMATION Primary Insurance: 1D#: Group #:  Secondary Insurance: 1D#: Group #:  SCHEDULE INFORMATION: Date: 05/20/24 Time: Location: [facility]    [1]  Allergies Allergen Reactions   Zoloft [Sertraline Hcl]     Unpleasant thoughts

## 2024-02-14 NOTE — Telephone Encounter (Signed)
 Duplicate request, refilled 02/11/24.  Requested Prescriptions  Pending Prescriptions Disp Refills   PROMETHEGAN 25 MG suppository [Pharmacy Med Name: Promethegan 25 mg rectal suppository] 6 suppository 1    Sig: INSERT ONE SUPPOSITORY RECTALLY EVERY 6 HOURS AS NEEDED FOR NAUSEA AND VOMITING     Not Delegated - Gastroenterology: Antiemetics Failed - 02/14/2024  9:13 AM      Failed - This refill cannot be delegated      Passed - Valid encounter within last 6 months    Recent Outpatient Visits           3 days ago Well woman exam   [facility] Glenard Mire, MD   2 months ago Intractable migraine without aura and with status migrainosus   [facility] Glenard Mire, MD

## 2024-03-03 ENCOUNTER — Ambulatory Visit
Admission: RE | Admit: 2024-03-03 | Discharge: 2024-03-03 | Disposition: A | Discharge status: Home / Self Care | Source: Ambulatory Visit | Attending: Family Medicine | Admitting: Family Medicine

## 2024-03-03 ENCOUNTER — Other Ambulatory Visit
Admission: RE | Admit: 2024-03-03 | Discharge: 2024-03-03 | Disposition: A | Discharge status: Home / Self Care | Source: Ambulatory Visit

## 2024-03-03 ENCOUNTER — Other Ambulatory Visit

## 2024-03-03 DIAGNOSIS — R928 Other abnormal and inconclusive findings on diagnostic imaging of breast: Secondary | ICD-10-CM

## 2024-03-03 DIAGNOSIS — N6081 Other benign mammary dysplasias of right breast: Secondary | ICD-10-CM | POA: Insufficient documentation

## 2024-03-03 DIAGNOSIS — N6001 Solitary cyst of right breast: Secondary | ICD-10-CM | POA: Insufficient documentation

## 2024-03-03 MED ORDER — LIDOCAINE 1 % OPTIME INJ - NO CHARGE
5.0000 mL | Freq: Once | INTRAMUSCULAR | Status: AC
  Administered 2024-03-03: 5 mL
  Filled 2024-03-03: qty 6

## 2024-03-05 LAB — CYTOLOGY - NON PAP

## 2024-03-06 LAB — BODY FLUID CULTURE W GRAM STAIN
Culture: NO GROWTH
Gram Stain: NONE SEEN

## 2024-03-11 ENCOUNTER — Ambulatory Visit
Admission: RE | Admit: 2024-03-11 | Discharge: 2024-03-11 | Disposition: A | Source: Ambulatory Visit | Attending: Nurse Practitioner

## 2024-03-11 ENCOUNTER — Encounter: Payer: Self-pay | Admitting: Nurse Practitioner

## 2024-03-11 ENCOUNTER — Ambulatory Visit: Payer: Self-pay | Admitting: Nurse Practitioner

## 2024-03-11 ENCOUNTER — Ambulatory Visit: Admitting: Nurse Practitioner

## 2024-03-11 VITALS — BP 146/80 | HR 75 | Temp 97.8°F | Ht 67.0 in | Wt 261.0 lb

## 2024-03-11 DIAGNOSIS — M5441 Lumbago with sciatica, right side: Secondary | ICD-10-CM

## 2024-03-11 DIAGNOSIS — R1031 Right lower quadrant pain: Secondary | ICD-10-CM

## 2024-03-11 LAB — POCT URINALYSIS DIPSTICK
Bilirubin, UA: NEGATIVE
Blood, UA: POSITIVE
Glucose, UA: NEGATIVE
Ketones, UA: NEGATIVE
Leukocytes, UA: NEGATIVE
Nitrite, UA: POSITIVE
Protein, UA: NEGATIVE
Spec Grav, UA: 1.02
Urobilinogen, UA: 0.2 U/dL
pH, UA: 5

## 2024-03-11 MED ORDER — PREDNISONE 10 MG (21) PO TBPK: ORAL_TABLET | ORAL | 0 refills | Status: DC

## 2024-03-11 MED ORDER — KETOROLAC TROMETHAMINE 60 MG/2ML IM SOLN
60.0000 mg | Freq: Once | INTRAMUSCULAR | Status: AC
  Administered 2024-03-11: 60 mg via INTRAMUSCULAR

## 2024-03-11 MED ORDER — BACLOFEN 10 MG PO TABS: 10.0000 mg | ORAL_TABLET | Freq: Three times a day (TID) | ORAL | 0 refills | Status: DC | PRN

## 2024-03-11 MED ORDER — NAPROXEN 500 MG PO TABS: 500.0000 mg | ORAL_TABLET | Freq: Two times a day (BID) | ORAL | 0 refills | Status: AC

## 2024-03-11 MED ORDER — TRIAMCINOLONE ACETONIDE 40 MG/ML IJ SUSP
40.0000 mg | Freq: Once | INTRAMUSCULAR | Status: AC
  Administered 2024-03-11: 40 mg via INTRAMUSCULAR

## 2024-03-11 NOTE — Progress Notes (Signed)
 "  BP (!) 146/80   Pulse 75   Temp 97.8 F (36.6 C)   Ht 5' 7 (1.702 m)   Wt 261 lb (118.4 kg)   LMP 11/28/2019   SpO2 98%   BMI 40.88 kg/m    Subjective:    Patient ID: Christine [name], female    DOB: 30-Nov-1970, 54 y.o.   MRN: [id number]  HPI: Christine [name] is a 54 y.o. female  Chief Complaint  Patient presents with   Back Pain    Pt c/o back pain x2 days.    Discussed the use of AI scribe software for clinical note transcription with the patient, who gave verbal consent to proceed.  History of Present Illness [name] is a 54 year old female who presents with lower back pain.  Lower back pain - Severe pain localized to the lower back since yesterday - Pain initially started in the upper back and migrated to the lower back - No specific injury or activity precipitated the pain - No fever - Tylenol  taken yesterday for pain relief - Previously prescribed baclofen , tolerated well, but currently unavailable  Urinary symptoms - Frequent sensation of needing to urinate - Often little to no urinary output  Menopausal status - Currently in menopause  Allergies - No known allergies         02/11/2024   10:41 AM 12/11/2023    3:24 PM 03/04/2021   11:57 AM  Depression screen PHQ 2/9  Decreased Interest 0 0 0  Down, Depressed, Hopeless 0 0 0  PHQ - 2 Score 0 0 0  Altered sleeping   0  Tired, decreased energy   0  Change in appetite   0  Feeling bad or failure about yourself    0  Trouble concentrating   0  Moving slowly or fidgety/restless   0  Suicidal thoughts   0  PHQ-9 Score   0   Difficult doing work/chores   Not difficult at all     Data saved with a previous flowsheet row definition    Relevant past medical, surgical, family and social history reviewed and updated as indicated. Interim medical history since our last visit reviewed. Allergies and medications reviewed and updated.  Review of Systems  Ten systems reviewed and is negative  except as mentioned in HPI      Objective:      BP (!) 146/80   Pulse 75   Temp 97.8 F (36.6 C)   Ht 5' 7 (1.702 m)   Wt 261 lb (118.4 kg)   LMP 11/28/2019   SpO2 98%   BMI 40.88 kg/m    Wt Readings from Last 3 Encounters:  03/11/24 261 lb (118.4 kg)  02/11/24 257 lb 9.6 oz (116.8 kg)  12/11/23 261 lb 11.2 oz (118.7 kg)    Physical Exam GENERAL: Alert, cooperative, well developed, no acute distress. HEENT: Normocephalic, normal oropharynx, moist mucous membranes. CHEST: Clear to auscultation bilaterally, no wheezes, rhonchi, or crackles. CARDIOVASCULAR: Normal heart rate and rhythm, S1 and S2 normal without murmurs. ABDOMEN: Soft, non-tender, non-distended, without organomegaly, normal bowel sounds. EXTREMITIES: No cyanosis or edema. MUSCULOSKELETAL: Tenderness in lower back. NEUROLOGICAL: Cranial nerves grossly intact, moves all extremities without gross motor or sensory deficit.  Results for orders placed or performed in visit on 03/11/24  POCT urinalysis dipstick   Collection Time: 03/11/24 11:19 AM  Result Value Ref Range   Color, UA yellow    Clarity, UA clear    Glucose,  UA Negative Negative   Bilirubin, UA negative    Ketones, UA negative    Spec Grav, UA 1.020 1.010 - 1.025   Blood, UA positive    pH, UA 5.0 5.0 - 8.0   Protein, UA Negative Negative   Urobilinogen, UA 0.2 0.2 or 1.0 E.U./dL   Nitrite, UA positive    Leukocytes, UA Negative Negative   Appearance     Odor            Assessment & Plan:   Problem List Items Addressed This Visit   None Visit Diagnoses       Acute midline low back pain with right-sided sciatica    -  Primary   Relevant Medications   ketorolac  (TORADOL ) injection 60 mg   triamcinolone  acetonide (KENALOG -40) injection 40 mg   baclofen  (LIORESAL ) 10 MG tablet   predniSONE  (STERAPRED UNI-PAK 21 TAB) 10 MG (21) TBPK tablet   naproxen  (NAPROSYN ) 500 MG tablet   Other Relevant Orders   POCT urinalysis dipstick  (Completed)   Urine Culture   CT RENAL STONE STUDY     Right lower quadrant abdominal pain            Assessment and Plan Assessment & Plan Acute low back pain with right-sided sciatica Acute low back pain with right-sided sciatica, onset yesterday, initially involving upper back, now localized to lower back. Pain is severe and musculoskeletal in nature, with possible sciatica as pain radiates down the leg. No recent trauma or specific inciting event reported. Blood pressure likely elevated due to pain. - Administered pain injection and steroid injection. - Prescribed baclofen  for muscle relaxation. - Prescribed pain medication to be taken twice daily for ten days, then as needed. - Advised use of Tylenol  for breakthrough pain. - Recommended stretching as tolerated, warm compresses, and Biofreeze for symptomatic relief.  Suspected urinary tract infection Due to symptoms of urinary urgency and dribbling. Urine dipstick test positive for nitrites and blood, indicating possible infection. - Sent urine for culture. - If urine culture is positive, will prescribe antibiotics.  Concern for kidney stone -patient has hematuria with back pain will get ct to rule out kidney stone.         Follow up plan: Return if symptoms worsen or fail to improve. "

## 2024-03-12 ENCOUNTER — Other Ambulatory Visit: Payer: Self-pay | Admitting: Nurse Practitioner

## 2024-03-12 ENCOUNTER — Other Ambulatory Visit (HOSPITAL_COMMUNITY): Payer: Self-pay

## 2024-03-12 DIAGNOSIS — R300 Dysuria: Secondary | ICD-10-CM

## 2024-03-12 MED ORDER — CIPROFLOXACIN HCL 500 MG PO TABS: 500.0000 mg | ORAL_TABLET | Freq: Two times a day (BID) | ORAL | 0 refills | Status: DC

## 2024-03-12 NOTE — Telephone Encounter (Signed)
 Pt notified, Also recommended [facility] or ER

## 2024-03-13 LAB — URINE CULTURE
MICRO NUMBER:: 17462713
SPECIMEN QUALITY:: ADEQUATE

## 2024-03-13 NOTE — Progress Notes (Signed)
 Attempted to call pt so she is aware of results.

## 2024-03-14 ENCOUNTER — Ambulatory Visit: Admitting: Family Medicine

## 2024-03-17 ENCOUNTER — Ambulatory Visit: Admitting: Family Medicine

## 2024-03-17 ENCOUNTER — Other Ambulatory Visit (HOSPITAL_COMMUNITY): Payer: Self-pay

## 2024-03-17 ENCOUNTER — Encounter: Payer: Self-pay | Admitting: Family Medicine

## 2024-03-17 ENCOUNTER — Telehealth: Payer: Self-pay | Admitting: Pharmacy Technician

## 2024-03-17 VITALS — BP 122/84 | HR 83 | Resp 16 | Ht 67.0 in | Wt 272.2 lb

## 2024-03-17 DIAGNOSIS — Z79899 Other long term (current) drug therapy: Secondary | ICD-10-CM | POA: Diagnosis not present

## 2024-03-17 DIAGNOSIS — E785 Hyperlipidemia, unspecified: Secondary | ICD-10-CM | POA: Diagnosis not present

## 2024-03-17 DIAGNOSIS — R10A1 Flank pain, right side: Secondary | ICD-10-CM

## 2024-03-17 DIAGNOSIS — K760 Fatty (change of) liver, not elsewhere classified: Secondary | ICD-10-CM | POA: Diagnosis not present

## 2024-03-17 DIAGNOSIS — G43109 Migraine with aura, not intractable, without status migrainosus: Secondary | ICD-10-CM | POA: Diagnosis not present

## 2024-03-17 DIAGNOSIS — I493 Ventricular premature depolarization: Secondary | ICD-10-CM | POA: Diagnosis not present

## 2024-03-17 DIAGNOSIS — E538 Deficiency of other specified B group vitamins: Secondary | ICD-10-CM

## 2024-03-17 DIAGNOSIS — N6001 Solitary cyst of right breast: Secondary | ICD-10-CM

## 2024-03-17 MED ORDER — NURTEC 75 MG PO TBDP: 1.0000 | ORAL_TABLET | Freq: Every day | ORAL | 0 refills | Status: AC | PRN

## 2024-03-17 MED ORDER — PROMETHAZINE HCL 25 MG RE SUPP: 25.0000 mg | Freq: Four times a day (QID) | RECTAL | 1 refills | Status: AC | PRN

## 2024-03-17 MED ORDER — QULIPTA 60 MG PO TABS: 1.0000 | ORAL_TABLET | Freq: Every day | ORAL | 1 refills | Status: AC

## 2024-03-17 MED ORDER — ZEPBOUND 2.5 MG/0.5ML ~~LOC~~ SOAJ: 2.5000 mg | SUBCUTANEOUS | 0 refills | Status: DC

## 2024-03-17 MED ORDER — SULFAMETHOXAZOLE-TRIMETHOPRIM 800-160 MG PO TABS: 1.0000 | ORAL_TABLET | Freq: Two times a day (BID) | ORAL | 0 refills | Status: DC

## 2024-03-17 MED ORDER — FOLIC ACID 1 MG PO TABS: 1.0000 mg | ORAL_TABLET | Freq: Every day | ORAL | 1 refills | Status: AC

## 2024-03-17 NOTE — Progress Notes (Signed)
 Name: Christine [name]   MRN: [id number]    DOB: 20-Apr-1970   Date:03/17/2024       Progress Note  Subjective  Chief Complaint  Chief Complaint  Patient presents with   Medical Management of Chronic Issues   Discussed the use of AI scribe software for clinical note transcription with the patient, who gave verbal consent to proceed.  History of Present Illness Christine [name] is a 54 year old female who presents with persistent flank and back pain.  She experiences persistent flank and back pain, primarily on the right side, which has not resolved despite treatment. Initially, she was prescribed Cipro  for a suspected blood infection, but the pain persisted, leading to an emergency room visit. There, she was given hydrocodone and underwent two CT scans this past weekend, which did not reveal any kidney stones or focal lesions. She was also prescribed naproxen  500 mg and baclofen  10 mg by [facility] last week , but she has been taking methocarbamol instead of baclofen  since visit to [facility].  She has a history of a bladder infection, confirmed by a urine culture showing significant Klebsiella colonies. Initially treated with Cipro  for three days, she continues to experience symptoms. No fever, chills, or visible blood in her urine, although microscopic blood was noted. She denies any allergies to medications.  Her blood pressure was elevated during her emergency room visit; she reports that her blood pressure is not usually high and attributes the elevation to pain. She does not take medication for blood pressure.  She reports significant fatigue, which may be related to low B12 and folic acid  levels. She has started taking B12 and vitamin D  supplements but has not yet started folic acid  supplementation.  She has a long  history of obesity, which she attributes to weight gain following a hysterectomy four to five years ago. She has tried various diets, including keto and meal plans, without success. She has  also tried weight loss medications like Mounjaro without significant results.   She experiences migraines approximately once a week, triggered by weather changes and associated with heightened sensitivity to smells and visual auras. She takes Qulipta  for prevention and Nurtec for acute episodes, which she reports are effective.  She has a history of a breast cyst, which was aspirated on January 5th but still feels a knot and experiences pain, especially when her grandson presses on it.    Patient Active Problem List   Diagnosis Date Noted   Perimenopausal vasomotor symptoms 10/08/2020   Class 3 severe obesity with body mass index (BMI) of 40.0 to 44.9 in adult (HCC) 10/08/2020   S/P hysterectomy 02/03/2020   Migraine without status migrainosus, not intractable 01/09/2020   Tobacco use disorder 04/15/2018   Multiple personality disorder (HCC) 01/03/2018   Mild episode of recurrent major depressive disorder 01/03/2018   Generalized anxiety disorder 01/03/2018    Past Surgical History:  Procedure Laterality Date   ABDOMINAL HYSTERECTOMY     BREAST BIOPSY N/A early 2000s   benign   CESAREAN SECTION  1994 , 1997   x 2    CYSTOSCOPY N/A 02/03/2020   Procedure: CYSTOSCOPY;  Surgeon: Lake Read, MD;  Location: [facility] OR[facility];  Service: Gynecology;  Laterality: N/A;   DILITATION & CURRETTAGE/HYSTROSCOPY WITH THERMACHOICE ABLATION N/A 01/13/2014   Procedure: DILATATION & CURETTAGE/HYSTEROSCOPY WITH THERMACHOICE ABLATION;  Surgeon: Norleen Edsel GAILS, MD;  Location: [facility];  Service: Gynecology;  Laterality: N/A;  uteral sound is to 11cm,45ml of D5W in, 22ml out. temp:87  degree celcious total therapy time-68min, 57 sec   TOTAL LAPAROSCOPIC HYSTERECTOMY WITH SALPINGECTOMY Bilateral 02/03/2020   Procedure: TOTAL LAPAROSCOPIC HYSTERECTOMY WITH SALPINGECTOMY;  Surgeon: Lake Read, MD;  Location: [facility];  Service: Gynecology;  Laterality: Bilateral;   TUBAL LIGATION      Family History   Problem Relation Age of Onset   Hypertension Mother    Heart attack Father    Stroke Maternal Grandmother    Heart attack Maternal Grandfather     Social History   Tobacco Use   Smoking status: Every Day    Current packs/day: 0.50    Average packs/day: 0.4 packs/day for 42.0 years (18.0 ttl pk-yrs)    Types: Cigarettes    Start date: 1984   Smokeless tobacco: Never  Substance Use Topics   Alcohol use: Never    Current Medications[1]  Allergies[2]  I personally reviewed active problem list, medication list, allergies, family history with the patient/caregiver today.   ROS  Ten systems reviewed and is negative except as mentioned in HPI    Objective Physical Exam VITALS: BP- 122/84 CONSTITUTIONAL: Patient appears well-developed and well-nourished. No distress. HEENT: Head atraumatic, normocephalic, neck supple. CARDIOVASCULAR: Normal rate, regular rhythm and normal heart sounds. No murmur heard. No BLE edema. PULMONARY: Effort normal and breath sounds normal. No respiratory distress. ABDOMINAL: Tenderness on percussion of the right CVA MUSCULOSKELETAL: Normal gait. Without gross motor or sensory deficit. PSYCHIATRIC: Patient has a normal mood and affect. Behavior is normal. Judgment and thought content normal. BREAST: Right breast cyst palpable, tender, and large.  Vitals:   03/17/24 0955  BP: 122/84  Pulse: 83  Resp: 16  SpO2: 97%  Weight: 272 lb 3.2 oz (123.5 kg)  Height: 5' 7 (1.702 m)    Body mass index is 42.63 kg/m.  Recent Results (from the past 2160 hours)  Cervicovaginal ancillary only     Status: None   Collection Time: 02/11/24 11:35 AM  Result Value Ref Range   Neisseria Gonorrhea Negative    Chlamydia Negative    Trichomonas Negative    Bacterial Vaginitis (gardnerella) Negative    Candida Vaginitis Negative    Candida Glabrata Negative    Comment      Normal Reference Range Bacterial Vaginosis - Negative   Comment Normal Reference  Range Candida Species - Negative    Comment Normal Reference Range Candida Galbrata - Negative    Comment Normal Reference Range Trichomonas - Negative    Comment Normal Reference Ranger Chlamydia - Negative    Comment      Normal Reference Range Neisseria Gonorrhea - Negative  CBC with Differential/Platelet     Status: Abnormal   Collection Time: 02/11/24 11:48 AM  Result Value Ref Range   WBC 10.6 3.8 - 10.8 Thousand/uL   RBC 4.93 3.80 - 5.10 Million/uL   Hemoglobin 14.1 11.7 - 15.5 g/dL   HCT 56.8 64.0 - 53.9 %   MCV 87.4 81.4 - 101.7 fL   MCH 28.6 27.0 - 33.0 pg   MCHC 32.7 31.6 - 35.4 g/dL   RDW 87.2 88.9 - 84.9 %   Platelets 292 140 - 400 Thousand/uL   MPV 12.6 (H) 7.5 - 12.5 fL   Neutro Abs 6,498 1,500 - 7,800 cells/uL   Absolute Lymphocytes 3,021 850 - 3,900 cells/uL   Absolute Monocytes 721 200 - 950 cells/uL   Eosinophils Absolute 265 15 - 500 cells/uL   Basophils Absolute 95 0 - 200 cells/uL   Neutrophils Relative % 61.3 %  Total Lymphocyte 28.5 %   Monocytes Relative 6.8 %   Eosinophils Relative 2.5 %   Basophils Relative 0.9 %  Comprehensive metabolic panel with GFR     Status: None   Collection Time: 02/11/24 11:48 AM  Result Value Ref Range   Glucose, Bld 88 65 - 99 mg/dL    Comment: .            Fasting reference interval .    BUN 16 7 - 25 mg/dL   Creat 9.18 9.49 - 8.96 mg/dL   eGFR 86 > OR = 60 fO/fpw/8.26f7   BUN/Creatinine Ratio SEE NOTE: 6 - 22 (calc)    Comment:    Not Reported: BUN and Creatinine are within    reference range. .    Sodium 141 135 - 146 mmol/L   Potassium 4.1 3.5 - 5.3 mmol/L   Chloride 103 98 - 110 mmol/L   CO2 27 20 - 32 mmol/L   Calcium 9.7 8.6 - 10.4 mg/dL   Total Protein 7.5 6.1 - 8.1 g/dL   Albumin 4.5 3.6 - 5.1 g/dL   Globulin 3.0 1.9 - 3.7 g/dL (calc)   AG Ratio 1.5 1.0 - 2.5 (calc)   Total Bilirubin 0.3 0.2 - 1.2 mg/dL   Alkaline phosphatase (APISO) 91 37 - 153 U/L   AST 13 10 - 35 U/L   ALT 14 6 - 29 U/L   Lipid panel     Status: Abnormal   Collection Time: 02/11/24 11:48 AM  Result Value Ref Range   Cholesterol 182 <200 mg/dL   HDL 47 (L) > OR = 50 mg/dL   Triglycerides 96 <849 mg/dL   LDL Cholesterol (Calc) 115 (H) mg/dL (calc)    Comment: Reference range: <100 . Desirable range <100 mg/dL for primary prevention;   <70 mg/dL for patients with CHD or diabetic patients  with > or = 2 CHD risk factors. SABRA LDL-C is now calculated using the Martin-Hopkins  calculation, which is a validated novel method providing  better accuracy than the Friedewald equation in the  estimation of LDL-C.  Gladis APPLETHWAITE et al. SANDREA. 7986;689(80): 2061-2068  (http://education.[facility].com/faq/FAQ164)    Total CHOL/HDL Ratio 3.9 <5.0 (calc)   Non-HDL Cholesterol (Calc) 135 (H) <130 mg/dL (calc)    Comment: For patients with diabetes plus 1 major ASCVD risk  factor, treating to a non-HDL-C goal of <100 mg/dL  (LDL-C of <29 mg/dL) is considered a therapeutic  option.   Hemoglobin A1c     Status: None   Collection Time: 02/11/24 11:48 AM  Result Value Ref Range   Hgb A1c MFr Bld 5.5 <5.7 %    Comment: For the purpose of screening for the presence of diabetes: . <5.7%       Consistent with the absence of diabetes 5.7-6.4%    Consistent with increased risk for diabetes             (prediabetes) > or =6.5%  Consistent with diabetes . This assay result is consistent with a decreased risk of diabetes. . Currently, no consensus exists regarding use of hemoglobin A1c for diagnosis of diabetes in children. . According to American Diabetes Association (ADA) guidelines, hemoglobin A1c <7.0% represents optimal control in non-pregnant diabetic patients. Different metrics may apply to specific patient populations.  Standards of Medical Care in Diabetes(ADA). .    Mean Plasma Glucose 111 mg/dL   eAG (mmol/L) 6.2 mmol/L  B12 and Folate Panel     Status: Abnormal  Collection Time: 02/11/24 11:48 AM   Result Value Ref Range   Vitamin B-12 279 200 - 1,100 pg/mL    Comment: . Please Note: Although the reference range for vitamin B12 is [phone] pg/mL, it has been reported that between 5 and 10% of patients with values between 200 and 400 pg/mL may experience neuropsychiatric and hematologic abnormalities due to occult B12 deficiency; less than 1% of patients with values above 400 pg/mL will have symptoms. .    Folate 5.0 (L) ng/mL    Comment:                            Reference Range                            Low:           <3.4                            Borderline:    3.4-5.4                            Normal:        >5.4 .   Vitamin D  (25 hydroxy)     Status: None   Collection Time: 02/11/24 11:48 AM  Result Value Ref Range   Vit D, 25-Hydroxy 31 30 - 100 ng/mL    Comment: Vitamin D  Status         25-OH Vitamin D : . Deficiency:                    <20 ng/mL Insufficiency:             20 - 29 ng/mL Optimal:                 > or = 30 ng/mL . For 25-OH Vitamin D  testing on patients on  D2-supplementation and patients for whom quantitation  of D2 and D3 fractions is required, the QuestAssureD(TM) 25-OH VIT D, (D2,D3), LC/MS/MS is recommended: order  code 07111 (patients >20yrs). . See Note 1 . Note 1 . For additional information, please refer to  http://education.[facility].com/faq/FAQ199  (This link is being provided for informational/ educational purposes only.)   RPR W/RFLX TO RPR TITER, TREPONEMAL AB, SCREEN AND DIAGNOSIS     Status: None   Collection Time: 02/11/24 11:48 AM  Result Value Ref Range   RPR Ser Ql NON-REACTIVE NON-REACTIVE    Comment: . No laboratory evidence of syphilis. If recent exposure is suspected, submit a new sample in 2-4 weeks. SABRA   HIV Antibody (routine testing w rflx)     Status: None   Collection Time: 02/11/24 11:48 AM  Result Value Ref Range   HIV FINAL INTERPRETATION HIV NEGATIVE     Comment: HIV-1 antigen and  HIV-1/HIV-2 antibodies were not detected. There is no laboratory evidence of HIV infection.    HIV 1&2 Ab, 4th Generation NON-REACTIVE NON-REACTIVE  Hepatitis B Surface AntiBODY     Status: Abnormal   Collection Time: 02/11/24 11:48 AM  Result Value Ref Range   Hep B S Ab REACTIVE (A) NON-REACTIVE  Cytology - Non PAP; Right breast 9:30 5 cmfn, 16 mm complicated cyst     Status: None   Collection Time: 03/03/24 12:00 AM  Result Value Ref Range   CYTOLOGY -  NON GYN      CYTOLOGY - NON PAP [facility] [address] [place], [place] [address] Telephone [phone] or [phone] Fax [phone]  CYTOPATHOLOGY REPORT   Accession #: [id number] Patient Name: NARYAH, CLENNEY Visit # : [id number]  MRN: [id number] Physician: Anice PONCE Rush DOB/Age 12-24-1970 (Age: 36) Gender: F Collected Date: 03/03/2024 Received Date: 03/03/2024  FINAL DIAGNOSIS STATEMENT Of SPECIMEN ADEQUACY:  INTERPRETATION(S):      NEGATIVE FOR MALIGNANCY      APOCRINE METAPLASIA AND MACROPHAGES CONSISTENT WITH CYST CONTENTS      DATE SIGNED OUT: 03/05/2024 ELECTRONIC SIGNATURE : Picklesimer Md, Prentice , Sports Administrator, Electronic Signature   CASE COMMENTS   CLINICAL HISTORY  SOURCE OF SPECIMEN(S) Breast, Fine Needle Aspiration  SPECIMEN COMMENTS: SPECIMEN CLINICAL INFORMATION: 1. Cyst debris, chronic abscess/inflammation, eval for atypia/malignancy; elective aspiration of complicated cyst, purulent brown fluid aspira ted, tender but otherwise no sx of infection; micro also placed, aspirated at 0810 03/03/24    Gross Description Specimen: Received is/are 1cc of brown fluid in a sterile syringe      Prepared:      # Smears: 0      # Concentration Technique Slides (i.e. ThinPrep): 1      # Cell Block: QNS      # Diff-Quick Stain: 0        Report signed out from the following location(s) [place]. [facility] [address] [place], [place]  [address] CLIA #: [id number]  [facility] [address] [place], [place] [address] CLIA #: [id number]   Body fluid culture w Gram Stain     Status: None   Collection Time: 03/03/24  9:30 AM   Specimen: Breast; Body Fluid  Result Value Ref Range   Specimen Description      BREAST Performed at [facility], [address], [place], [place] [address]    Special Requests      NONE Performed at [facility], [address] [place], [place] [address]    Gram Stain NO WBC SEEN NO ORGANISMS SEEN     Culture      NO GROWTH 3 DAYS Performed at [facility], [address] [place], [place] [address]    Report Status 03/06/2024 FINAL   POCT urinalysis dipstick     Status: None   Collection Time: 03/11/24 11:19 AM  Result Value Ref Range   Color, UA yellow    Clarity, UA clear    Glucose, UA Negative Negative   Bilirubin, UA negative    Ketones, UA negative    Spec Grav, UA 1.020 1.010 - 1.025   Blood, UA positive    pH, UA 5.0 5.0 - 8.0   Protein, UA Negative Negative   Urobilinogen, UA 0.2 0.2 or 1.0 E.U./dL   Nitrite, UA positive    Leukocytes, UA Negative Negative   Appearance     Odor    Urine Culture     Status: Abnormal   Collection Time: 03/11/24 11:47 AM   Specimen: Urine  Result Value Ref Range   MICRO NUMBER: [id number]    SPECIMEN QUALITY: Adequate    Sample Source URINE    STATUS: FINAL    ISOLATE 1: Klebsiella pneumoniae (A)     Comment: Greater than 100,000 CFU/mL of Klebsiella pneumoniae      Susceptibility   Klebsiella pneumoniae - URINE CULTURE, REFLEX    AMOX/CLAVULANIC <=2 Sensitive  AMPICILLIN/SULBACTAM <=2 Sensitive     CEFAZOLIN * <=1 Not Reportable      * For infections other than uncomplicated UTI caused by E. coli, K. pneumoniae or P. mirabilis: Cefazolin  is resistant if MIC > or = 8 mcg/mL. (Distinguishing susceptible versus intermediate for isolates with MIC < or = 4 mcg/mL requires additional  testing.) For uncomplicated UTI caused by E. coli, K. pneumoniae or P. mirabilis: Cefazolin  is susceptible if MIC <32 mcg/mL and predicts susceptible to the oral agents cefaclor, cefdinir , cefpodoxime, cefprozil, cefuroxime, cephalexin  and loracarbef.     CEFTAZIDIME <=0.5 Sensitive     CEFEPIME <=0.12 Sensitive     CEFTRIAXONE <=0.25 Sensitive     CIPROFLOXACIN  <=0.06 Sensitive     LEVOFLOXACIN <=0.12 Sensitive     GENTAMICIN <=1 Sensitive     IMIPENEM <=0.25 Sensitive     MEROPENEM <=0.25 Sensitive     NITROFURANTOIN  32 Sensitive     PIP/TAZO <=4 Sensitive     TRIMETH /SULFA * <=20 Sensitive      * For infections other than uncomplicated UTI caused by E. coli, K. pneumoniae or P. mirabilis: Cefazolin  is resistant if MIC > or = 8 mcg/mL. (Distinguishing susceptible versus intermediate for isolates with MIC < or = 4 mcg/mL requires additional testing.) For uncomplicated UTI caused by E. coli, K. pneumoniae or P. mirabilis: Cefazolin  is susceptible if MIC <32 mcg/mL and predicts susceptible to the oral agents cefaclor, cefdinir , cefpodoxime, cefprozil, cefuroxime, cephalexin  and loracarbef. Legend: S = Susceptible  I = Intermediate R = Resistant  NS = Not susceptible SDD = Susceptible Dose Dependent * = Not Tested  NR = Not Reported **NN = See Therapy Comments     Diabetic Foot Exam:     PHQ2/9:    03/17/2024    9:51 AM 02/11/2024   10:41 AM 12/11/2023    3:24 PM 03/04/2021   11:57 AM 10/20/2020    1:28 PM  Depression screen PHQ 2/9  Decreased Interest 0 0 0 0 0  Down, Depressed, Hopeless 0 0 0 0 0  PHQ - 2 Score 0 0 0 0 0  Altered sleeping    0 0  Tired, decreased energy    0 0  Change in appetite    0 0  Feeling bad or failure about yourself     0 0  Trouble concentrating    0 0  Moving slowly or fidgety/restless    0 0  Suicidal thoughts    0 0  PHQ-9 Score    0  0   Difficult doing work/chores    Not difficult at all Not difficult at all     Data saved  with a previous flowsheet row definition    phq 9 is negative  Fall Risk:    03/17/2024    9:51 AM 02/11/2024   10:41 AM 12/11/2023    3:24 PM 03/04/2021   11:57 AM 10/20/2020    1:28 PM  Fall Risk   Falls in the past year? 0 0 0 0 0  Number falls in past yr: 0 0 0 0 0  Injury with Fall? 0 0 0  0  0   Risk for fall due to : No Fall Risks No Fall Risks No Fall Risks    Follow up Falls evaluation completed Falls evaluation completed Falls evaluation completed Falls evaluation completed       Data saved with a previous flowsheet row definition     Assessment & Plan Urinary tract infection  with right flank pain Persistent right flank pain despite Cipro . Urine culture showed Klebsiella. CT scan negative for stones or hydronephrosis. Possible pyelonephritis.  - completed  Cipro  for 3 days . - Prescribed Sulfa  for 5 days. - Encouraged increased water intake.  Morbid obesity complicated by fatty liver and dyslipidemia Morbid obesity with BMI over 40, fatty liver, and dyslipidemia. LDL slightly above goal, HDL low. Previous weight loss attempts with Mounjaro unsuccessful. Insurance may cover Zepbound . Emphasized weight loss for health improvement. - Submitted prior authorization for Zepbound . - Advised dietary changes: increase protein intake, consume tree nuts, fish, and shellfish. - Encouraged physical activity and weight training. - Scheduled follow-up in 3 months if Zepbound  approved, otherwise in 6 months.  Migraine with aura Migraines occur weekly with aura. Current treatment with Qulipta  and Nurtec effective. No side effects reported. - Continue Qulipta  and Nurtec. - Prescribed promethazine  for nausea.  Vitamin B12 and folic acid  deficiencies Low B12 and folic acid  levels contributing to fatigue. B12 supplementation started. - Prescribed folic acid  1 mg daily, then reduce to 3 times a week after 4 days. - Will recheck B12 and folic acid  levels in 6 months.  Premature  ventricular complex EKG showed occasional  premature ventricular complex, normal sinus rhythm.   Cyst of right breast, persistent and painful Persistent painful cyst in right breast. Previous aspiration incomplete. Pathology negative for malignancy. Discussed surgical removal if pain persists. - Referred to surgeon for evaluation and possible removal.        [1]  Current Outpatient Medications:    Atogepant  (QULIPTA ) 60 MG TABS, Take 1 tablet (60 mg total) by mouth daily. For prevention, Disp: 90 tablet, Rfl: 0   baclofen  (LIORESAL ) 10 MG tablet, Take 1 tablet (10 mg total) by mouth 3 (three) times daily as needed for muscle spasms., Disp: 30 each, Rfl: 0   HYDROcodone-acetaminophen  (NORCO/VICODIN) 5-325 MG tablet, Take 1 tablet by mouth every 4 (four) hours as needed., Disp: , Rfl:    methocarbamol (ROBAXIN) 750 MG tablet, Take 750 mg by mouth every 6 (six) hours as needed., Disp: , Rfl:    naproxen  (NAPROSYN ) 500 MG tablet, Take 1 tablet (500 mg total) by mouth 2 (two) times daily with a meal., Disp: 30 tablet, Rfl: 0   promethazine  (PHENERGAN ) 25 MG suppository, Place 1 suppository (25 mg total) rectally every 6 (six) hours as needed for nausea or vomiting., Disp: 6 each, Rfl: 1   Rimegepant Sulfate (NURTEC) 75 MG TBDP, Take 1 tablet (75 mg total) by mouth daily as needed. For break through migraine, Disp: 16 tablet, Rfl: 0 [2]  Allergies Allergen Reactions   Zoloft [Sertraline Hcl]     Unpleasant thoughts

## 2024-03-17 NOTE — Telephone Encounter (Signed)
 Pharmacy Patient Advocate Encounter   Received notification from [facility] that prior authorization for Zepbound  2.5 mg/0.57ml pen is required/requested.   Insurance verification completed.   The patient is insured through [employer].   Per test claim: Per test claim, medication is not covered due to plan/benefit exclusion, PA not submitted at this time  **Possible Altenatives are below**

## 2024-03-25 ENCOUNTER — Encounter: Payer: Self-pay | Admitting: Family Medicine

## 2024-03-26 ENCOUNTER — Encounter: Payer: Self-pay | Admitting: Surgery

## 2024-03-26 ENCOUNTER — Ambulatory Visit: Admitting: Surgery

## 2024-03-26 ENCOUNTER — Telehealth: Payer: Self-pay | Admitting: Surgery

## 2024-03-26 VITALS — BP 135/80 | HR 69 | Ht 66.0 in | Wt 261.0 lb

## 2024-03-26 DIAGNOSIS — N6001 Solitary cyst of right breast: Secondary | ICD-10-CM | POA: Diagnosis not present

## 2024-03-26 DIAGNOSIS — B1910 Unspecified viral hepatitis B without hepatic coma: Secondary | ICD-10-CM

## 2024-03-26 DIAGNOSIS — N6452 Nipple discharge: Secondary | ICD-10-CM | POA: Diagnosis not present

## 2024-03-26 NOTE — Patient Instructions (Addendum)
 We referred you to  GI, they will call you to make an appointment    Surgical Breast Biopsy A breast biopsy is a procedure where a small piece (sample) of breast tissue is removed from the breast and tested for cancer (malignant) cells. In a surgical or open breast biopsy, the tissue sample is removed from the changed area in the breast. This abnormal tissue is found by either feeling (palpating) it or with the help of imaging studies such as mammogram, ultrasound, or MRI. One of two methods is used: In an incisional biopsy, part of the abnormal area is removed. In an excisional biopsy, all of the abnormal area is removed. A surgical breast biopsy lets your health care provider either remove the whole mass or take out a larger amount of breast tissue for testing than breast biopsies done with needles. You may need a surgical breast biopsy if you have: Abnormalities, like lumps or masses, seen on breast imaging tests. Redness, swelling, or dimpling of the breast, or pain in the breast. Nipple changes, such as crusting, sores, or abnormal bleeding or other discharge. Already had a different type of breast biopsy and the results were unclear. Had another type of breast biopsy, but there was not enough tissue for testing. If a breast biopsy shows that the change is cancer, the information from the biopsy can help in choosing the best type of treatment. There are many different types of breast biopsies. Talk with your health care provider about your options and which type is best for you. Tell your health care provider about: Any allergies you have. All medicines you are taking, including vitamins, herbs, eye drops, creams, and over-the-counter medicines. Any problems you or family members have had with anesthetic medicines. Any bleeding problems you have. Any medical conditions you have. Any surgeries you have had. Whether you are pregnant or may be pregnant. Whether you have a cardiac  pacemaker or other electronic device implanted in your body, if the biopsy will be done using an MRI. What are the risks? Generally, this is a safe procedure. However, problems may occur, including: Infection. Bleeding. Allergic reactions to medicines. Damage to other tissues. What happens before the procedure? Eating and drinking restrictions Follow instructions from your health care provider about what you may eat and drink before your procedure. These may include: 8 hours before your procedure Stop eating most foods. Do not eat meat, fried foods, or fatty foods. Eat only light foods, such as toast or crackers. All liquids are okay except energy drinks and alcohol. 6 hours before your procedure Stop eating. Drink only clear liquids, such as water, clear fruit juice, black coffee, plain tea, and sports drinks. Do not drink energy drinks or alcohol. 2 hours before your procedure Stop drinking all liquids. You may be allowed to take medicines with small sips of water. If you do not follow your health care provider's instructions, your procedure may be delayed or canceled. Medicines Ask your health care provider about: Changing or stopping your regular medicines. This is especially important if you are taking diabetes medicines or blood thinners. Taking medicines such as aspirin and ibuprofen . These medicines can thin your blood. Do not take these medicines unless your health care provider tells you to take them. Taking over-the-counter medicines, vitamins, herbs, and supplements. Lifestyle Do not use any products that contain nicotine or tobacco before the procedure. These products include cigarettes, chewing tobacco, and vaping devices, such as e-cigarettes. If you need help quitting, ask your  health care provider. Do not drink alcohol for 24 hours before your procedure. General instructions If the tissue to be biopsied cannot be easily found by feeling (palpating) the breast, you may  have a procedure before the surgery to find and mark the area (localization). This is done to guide your surgeon to the tissue that needs to be removed. It may be done with: Imaging, such as a mammogram, ultrasound, or MRI. Placement of a small wire, clip, or an implant that will reflect a radar signal. Do not put on antiperspirants or deodorants the day of the procedure. These can cause white spots to show on X-ray images. Wear a good support bra to the procedure. You will be asked to remove certain items, such as jewelry and dentures, that might cause problems with the X-ray images. Ask your health care provider: How your surgery site will be marked. What steps will be taken to help prevent infection. These may include: Washing skin with a germ-killing soap. Taking antibiotic medicine. If you will be going home right after the procedure, plan to have a responsible adult: Take you home from the hospital or clinic. You will not be allowed to drive. Care for you for the time you are told. What happens during the procedure?  An IV will be put into one of the veins in your arm or hand. You may be given one or more of the following: A medicine to help you relax (sedative). A medicine to numb the breast area (local anesthetic). A medicine to make you fall asleep (general anesthetic). A cut (incision) will be made in your breast. The abnormal area will be found. Then, either part or all of that area will be removed. If all of the changed tissue will be removed, an edge of normal breast tissue around it may also be removed. If localization was done, the wire, clip, or implant that was placed will also be removed. The incision will be stitched (sutured) or taped and covered with a bandage (dressing). Your health care provider may apply: A pressure dressing. This is a bandage that is wrapped tightly around your chest. An ice pack to help prevent bleeding and swelling in the breast. The procedure  may vary among health care providers and hospitals. What happens after the procedure? You will be taken to the recovery area. If you are doing well and have no problems, you will be allowed to go home. You may be told to wear a supportive bra for some time. You may be given pain medicine as needed. If you were given a sedative during the procedure, it can affect you for several hours. Do not drive or operate machinery until your health care provider says that it is safe. It is up to you to get the results of your procedure. Ask your health care provider, or the department that is doing the procedure, when your results will be ready and how you will get them. Summary A surgical breast biopsy is a procedure to surgically remove and test part (incisional biopsy) or all (excisional biopsy) of an abnormal area of tissue found in the breast. Generally, this is a safe procedure, but problems can occur. Risks include infection, bleeding, allergic reaction, or damage to other tissues. Ask your health care provider about any changes you may need to make to your medicines before the procedure. Plan to have a responsible adult take you home from the hospital or clinic. This information is not intended to replace advice given  to you by your health care provider. Make sure you discuss any questions you have with your health care provider. Document Revised: 01/24/2021 Document Reviewed: 01/24/2021 Elsevier Patient Education  2024 [employer].

## 2024-03-26 NOTE — Telephone Encounter (Signed)
 Patient has been advised of Pre-Admission date/time, and Surgery date at [facility].  Surgery Date: 04/08/24 Preadmission Testing Date: 04/01/24 (phone 8a-1p)  Patient informed of the scheduling process and surgery information given at time of office visit.  Patient has been made aware to call [phone], between 1-3:00pm the day before surgery, to find out what time to arrive for surgery.

## 2024-03-26 NOTE — Progress Notes (Signed)
 Patient ID: Christine [name], female   DOB: August 09, 1970, 54 y.o.   MRN: [id number]  HPI Christine [name] is a 54 y.o. female seen in consultation at the request of Dr Glenard for right breast lump. She endorse having a Right breast lump and brown discharge as well as intermittent pain that is sharp moderate, worsening with tight garmets. She has had these symptoms for a while and she has had breast issues for a while.  She has had bilateral brown nipple DC for the last 20 years , usually when stimulated. Left I/D  Breast abscess 2000s Incisional Breast biopsy for benign breast 2010s [place], [place] Hysterectomy 2021 G3P3  Mammo and U/S pers reviewed  NEGATIVE FOR MALIGNANCY  APOCRINE METAPLASIA AND MACROPHAGES CONSISTENT WITH CYST CONTENTS  HPI  Past Medical History:  Diagnosis Date   Abnormal uterine bleeding (AUB)    Anxiety    Arthritis    Complication of anesthesia    Depression    GERD (gastroesophageal reflux disease)    History of kidney stones    Irregular uterine bleeding    Menorrhagia with irregular cycle 11/19/2013   Migraines    Multiple personality disorder (HCC)    PONV (postoperative nausea and vomiting)     Past Surgical History:  Procedure Laterality Date   ABDOMINAL HYSTERECTOMY     BREAST BIOPSY N/A early 2000s   benign   CESAREAN SECTION  1994 , 1997   x 2    CYSTOSCOPY N/A 02/03/2020   Procedure: CYSTOSCOPY;  Surgeon: Lake Read, MD;  Location: [facility] ORS;  Service: Gynecology;  Laterality: N/A;   DILITATION & CURRETTAGE/HYSTROSCOPY WITH THERMACHOICE ABLATION N/A 01/13/2014   Procedure: DILATATION & CURETTAGE/HYSTEROSCOPY WITH THERMACHOICE ABLATION;  Surgeon: Norleen Edsel GAILS, MD;  Location: [facility];  Service: Gynecology;  Laterality: N/A;  uteral sound is to 11cm,75ml of D5W in, 22ml out. temp:87 degree celcious total therapy time-64min, 57 sec   TOTAL LAPAROSCOPIC HYSTERECTOMY WITH SALPINGECTOMY Bilateral 02/03/2020   Procedure: TOTAL LAPAROSCOPIC  HYSTERECTOMY WITH SALPINGECTOMY;  Surgeon: Lake Read, MD;  Location: [facility] ORS;  Service: Gynecology;  Laterality: Bilateral;   TUBAL LIGATION      Family History  Problem Relation Age of Onset   Hypertension Mother    Heart attack Father    Stroke Maternal Grandmother    Heart attack Maternal Grandfather     Social History Social History[1]  Allergies[2]  Current Outpatient Medications  Medication Sig Dispense Refill   Atogepant  (QULIPTA ) 60 MG TABS Take 1 tablet (60 mg total) by mouth daily. For prevention 90 tablet 1   folic acid  (FOLVITE ) 1 MG tablet Take 1 tablet (1 mg total) by mouth daily. 100 tablet 1   HYDROcodone-acetaminophen  (NORCO/VICODIN) 5-325 MG tablet Take 1 tablet by mouth every 4 (four) hours as needed.     methocarbamol (ROBAXIN) 750 MG tablet Take 750 mg by mouth every 6 (six) hours as needed.     naproxen  (NAPROSYN ) 500 MG tablet Take 1 tablet (500 mg total) by mouth 2 (two) times daily with a meal. 30 tablet 0   promethazine  (PHENERGAN ) 25 MG suppository Place 1 suppository (25 mg total) rectally every 6 (six) hours as needed for nausea or vomiting. 6 each 1   Rimegepant Sulfate (NURTEC) 75 MG TBDP Take 1 tablet (75 mg total) by mouth daily as needed. For break through migraine 16 tablet 0   sulfamethoxazole -trimethoprim  (BACTRIM  DS) 800-160 MG tablet Take 1 tablet by mouth 2 (two) times daily. 10 tablet 0  tirzepatide  (ZEPBOUND ) 2.5 MG/0.5ML Pen Inject 2.5 mg into the skin once a week. 2 mL 0   No current facility-administered medications for this visit.     Review of Systems Full ROS  was asked and was negative except for the information on the HPI  Physical Exam Last menstrual period 11/28/2019. CONSTITUTIONAL: NAD chaperone present BMI 42. EYES: Pupils are equal, round, Sclera are non-icteric. EARS, NOSE, MOUTH AND THROAT: The oropharynx is clear. The oral mucosa is pink and moist. Hearing is intact to voice. LYMPH NODES:  Lymph nodes in the  neck are normal. RESPIRATORY:  Lungs are clear. There is normal respiratory effort, with equal breath sounds bilaterally, and without pathologic use of accessory muscles. CARDIOVASCULAR: Heart is regular without murmurs, gallops, or rubs. BREAST: palpable mass 10 o'clock 4 cms from nipple, mobile. Inverted bilateral nipples, No skin or nipples lesion, no open wounds, No axillary LAD GI: The abdomen is  soft, nontender, and nondistended. There are no palpable masses. There is no hepatosplenomegaly. There are normal bowel sounds in all quadrants. GU: Rectal deferred.   MUSCULOSKELETAL: Normal muscle strength and tone. No cyanosis or edema.   SKIN: Turgor is good and there are no pathologic skin lesions or ulcers. NEUROLOGIC: Motor and sensation is grossly normal. Cranial nerves are grossly intact. PSYCH:  Oriented to person, place and time. Affect is normal.  Data Reviewed I have personally reviewed the patient's imaging, laboratory findings and medical records.    Assessment/Plan 55 year old female with right breast cystic lesion that has recurred and is symptomatic.  Discussed with patient in detail about options of observation's, intermittent aspirations versus excision.  Patient is ready for excisional biopsy of the right breast.  Discussed with her in detail about the procedure and about the need for [facility] [address] scout to help localize the area.  Procedure discussed with her in detail.  The risk benefits and the possible medications including but not limited to: Bleeding, infection, recurrence, breast deformity.  She understands and wished to proceed Regarding her bilateral nipple discharge cysts I think it is completely separate issues.  Discussed with her potentially obtaining an MRI and she wishes to hold off until we address the right breast cystic lesion. In addition she does have hepatitis B antigen we will make appropriate referral to GI to further work this up and potentially offer treatment. A  copy of this report was sent to the referring provider I personally spent a total of 55 minutes in the care of the patient today including performing a medically appropriate exam/evaluation, counseling and educating, placing orders, referring and communicating with other health care professionals, documenting clinical information in the EHR, independently interpreting and reviewing images studies and coordinating care.    Laneta Luna, MD FACS General Surgeon 03/26/2024, 9:17 AM       [1]  Social History Tobacco Use   Smoking status: Every Day    Current packs/day: 0.50    Average packs/day: 0.4 packs/day for 42.1 years (18.0 ttl pk-yrs)    Types: Cigarettes    Start date: 1984   Smokeless tobacco: Never  Vaping Use   Vaping status: Never Used  Substance Use Topics   Alcohol use: Never   Drug use: Never  [2]  Allergies Allergen Reactions   Zoloft [Sertraline Hcl]     Unpleasant thoughts

## 2024-03-27 ENCOUNTER — Telehealth: Payer: Self-pay | Admitting: Surgery

## 2024-03-27 ENCOUNTER — Other Ambulatory Visit: Payer: Self-pay | Admitting: Surgery

## 2024-03-27 DIAGNOSIS — N6081 Other benign mammary dysplasias of right breast: Secondary | ICD-10-CM

## 2024-03-27 NOTE — Telephone Encounter (Signed)
 I called the pt after realizing that her He B was antibody and not antigen as I previously misinterpreted. SHe does not need treatment . We will cancel GI appt. SHe is appreciative of the call. Dr. Glenard made aware

## 2024-04-01 ENCOUNTER — Other Ambulatory Visit: Payer: Self-pay

## 2024-04-01 ENCOUNTER — Inpatient Hospital Stay
Admission: RE | Admit: 2024-04-01 | Discharge: 2024-04-01 | Disposition: A | Source: Ambulatory Visit | Attending: Surgery

## 2024-04-01 ENCOUNTER — Ambulatory Visit
Admission: RE | Admit: 2024-04-01 | Discharge: 2024-04-01 | Disposition: A | Source: Ambulatory Visit | Attending: Surgery

## 2024-04-01 DIAGNOSIS — N6081 Other benign mammary dysplasias of right breast: Secondary | ICD-10-CM

## 2024-04-01 HISTORY — DX: Solitary cyst of right breast: N60.01

## 2024-04-01 MED ORDER — LIDOCAINE HCL 1 % IJ SOLN
10.0000 mL | Freq: Once | INTRAMUSCULAR | Status: AC
  Administered 2024-04-01: 10 mL
  Filled 2024-04-01: qty 10

## 2024-04-01 NOTE — Patient Instructions (Addendum)
 Your procedure is scheduled on: Tuesday 04/08/24 Report to the Registration Desk on the 1st floor of the [facility]. To find out your arrival time, please call [phone] between 1PM - 3PM on: Monday 04/07/24  If your arrival time is 6:00 am, do not arrive before that time as the [facility] entrance doors do not open until 6:00 am.  REMEMBER: Instructions that are not followed completely may result in serious medical risk, up to and including death; or upon the discretion of your surgeon and anesthesiologist your surgery may need to be rescheduled.  Do not eat food after midnight the night before surgery.  No gum chewing or hard candies.  You may however, drink CLEAR liquids up to 2 hours before you are scheduled to arrive for your surgery. Do not drink anything within 2 hours of your scheduled arrival time.  Clear liquids include: - water  - apple juice without pulp - black coffee or tea (Do NOT add milk or creamers to the coffee or tea) Do NOT drink anything that is not on this list.   One week prior to surgery: Stop Anti-inflammatories (NSAIDS) such as Advil, Aleve, Ibuprofen, Motrin, Naproxen, Naprosyn and Aspirin based products such as Excedrin, Goody's Powder, BC Powder. Stop ANY OVER THE COUNTER supplements until after surgery.  You may however, continue to take Tylenol if needed for pain up until the day of surgery.  Continue taking all of your other prescription medications up until the day of surgery.  ON THE DAY OF SURGERY ONLY TAKE THESE MEDICATIONS WITH SIPS OF WATER:  None    No Alcohol for 24 hours before or after surgery.  No Smoking including e-cigarettes for 24 hours before surgery.  No chewable tobacco products for at least 6 hours before surgery.  No nicotine patches on the day of surgery.  Do not use any recreational drugs for at least a week (preferably 2 weeks) before your surgery.  Please be advised that the combination of cocaine and anesthesia  may have negative outcomes, up to and including death. If you test positive for cocaine, your surgery will be cancelled.  On the morning of surgery brush your teeth with toothpaste and water, you may rinse your mouth with mouthwash if you wish. Do not swallow any toothpaste or mouthwash.  Use CHG Soap or wipes as directed on instruction sheet.  Do not wear jewelry, make-up, hairpins, clips or nail polish.  For welded (permanent) jewelry: bracelets, anklets, waist bands, etc.  Please have this removed prior to surgery.  If it is not removed, there is a chance that hospital personnel will need to cut it off on the day of surgery.  Do not wear lotions, powders, or perfumes.   Do not shave body hair from the neck down 48 hours before surgery.  Contact lenses, hearing aids and dentures may not be worn into surgery.  Do not bring valuables to the hospital. [facility] is not responsible for any missing/lost belongings or valuables.   Notify your doctor if there is any change in your medical condition (cold, fever, infection).  Wear comfortable clothing (specific to your surgery type) to the hospital.  After surgery, you can help prevent lung complications by doing breathing exercises.  Take deep breaths and cough every 1-2 hours. Your doctor may order a device called an Incentive Spirometer to help you take deep breaths. When coughing or sneezing, hold a pillow firmly against your incision with both hands. This is called splinting.  Doing this helps protect your incision. It also decreases belly discomfort.  If you are being admitted to the hospital overnight, leave your suitcase in the car. After surgery it may be brought to your room.  In case of increased patient census, it may be necessary for you, the patient, to continue your postoperative care in the Same Day Surgery department.  If you are being discharged the day of surgery, you will not be allowed to drive home. You will need a  responsible individual to drive you home and stay with you for 24 hours after surgery.   If you are taking public transportation, you will need to have a responsible individual with you.  Please call the Pre-admissions Testing Dept. at [phone] if you have any questions about these instructions.  Surgery Visitation Policy:  Patients having surgery or a procedure may have two visitors.  Children under the age of 31 must have an adult with them who is not the patient.  Inpatient Visitation:    Visiting hours are 7 a.m. to 8 p.m. Up to four visitors are allowed at one time in a patient room. The visitors may rotate out with other people during the day.  One visitor age 2 or older may stay with the patient overnight and must be in the room by 8 p.m.   Merchandiser, Retail to address health-related social needs:  [email or web address]                                                                                                           Preparing for Surgery with CHLORHEXIDINE GLUCONATE (CHG) Soap  Chlorhexidine Gluconate (CHG) Soap  o An antiseptic cleaner that kills germs and bonds with the skin to continue killing germs even after washing  o Used for showering the night before surgery and morning of surgery  Before surgery, you can play an important role by reducing the number of germs on your skin.  CHG (Chlorhexidine gluconate) soap is an antiseptic cleanser which kills germs and bonds with the skin to continue killing germs even after washing.  Please do not use if you have an allergy to CHG or antibacterial soaps. If your skin becomes reddened/irritated stop using the CHG.  1. Shower the NIGHT BEFORE SURGERY with CHG soap.  2. If you choose to wash your hair, wash your hair first as usual with your normal shampoo.  3. After shampooing, rinse your hair and body thoroughly to remove the shampoo.  4. Use CHG as you would any other liquid soap. You  can apply CHG directly to the skin and wash gently with a clean washcloth.  5. Apply the CHG soap to your body only from the neck down. Do not use on open wounds or open sores. Avoid contact with your eyes, ears, mouth, and genitals (private parts). Wash face and genitals (private parts) with your normal soap.  6. Wash thoroughly, paying special attention to the area where your surgery will be performed.  7. Thoroughly rinse your body with warm water.  8.  Do not shower/wash with your normal soap after using and rinsing off the CHG soap.  9. Do not use lotions, oils, etc., after showering with CHG.  10. Pat yourself dry with a clean towel.  11. Wear clean pajamas to bed the night before surgery.  12. Place clean sheets on your bed the night of your shower and do not sleep with pets.  13. Do not apply any deodorants/lotions/powders.  14. Please wear clean clothes to the hospital.  15. Remember to brush your teeth with your regular toothpaste.

## 2024-04-08 ENCOUNTER — Encounter: Admission: RE | Payer: Self-pay | Source: Home / Self Care

## 2024-04-08 ENCOUNTER — Ambulatory Visit: Admit: 2024-04-08 | Admitting: Surgery

## 2024-04-08 ENCOUNTER — Encounter
